# Patient Record
Sex: Female | Born: 1955
Health system: Southern US, Community
[De-identification: ages and names within clinical notes are randomized; demographics above are authoritative.]

## PROBLEM LIST (undated history)

## (undated) DIAGNOSIS — D649 Anemia, unspecified: Secondary | ICD-10-CM

## (undated) DIAGNOSIS — I10 Essential (primary) hypertension: Secondary | ICD-10-CM

## (undated) DIAGNOSIS — M3500 Sicca syndrome, unspecified: Secondary | ICD-10-CM

## (undated) DIAGNOSIS — Z972 Presence of dental prosthetic device (complete) (partial): Secondary | ICD-10-CM

## (undated) DIAGNOSIS — D72819 Decreased white blood cell count, unspecified: Secondary | ICD-10-CM

## (undated) DIAGNOSIS — F329 Major depressive disorder, single episode, unspecified: Secondary | ICD-10-CM

## (undated) DIAGNOSIS — S83249A Other tear of medial meniscus, current injury, unspecified knee, initial encounter: Secondary | ICD-10-CM

## (undated) DIAGNOSIS — I639 Cerebral infarction, unspecified: Secondary | ICD-10-CM

## (undated) DIAGNOSIS — F32A Depression, unspecified: Secondary | ICD-10-CM

## (undated) DIAGNOSIS — Z973 Presence of spectacles and contact lenses: Secondary | ICD-10-CM

## (undated) DIAGNOSIS — F419 Anxiety disorder, unspecified: Secondary | ICD-10-CM

## (undated) HISTORY — PX: LIVER BIOPSY: SHX301

## (undated) HISTORY — DX: Depression, unspecified: F32.A

## (undated) HISTORY — PX: LUNG BIOPSY: SHX232

## (undated) HISTORY — PX: COLONOSCOPY: SHX174

## (undated) HISTORY — DX: Essential (primary) hypertension: I10

## (undated) HISTORY — DX: Major depressive disorder, single episode, unspecified: F32.9

## (undated) HISTORY — DX: Cerebral infarction, unspecified: I63.9

---

## 1991-04-08 HISTORY — PX: PARTIAL HYSTERECTOMY: SHX80

## 1991-04-08 HISTORY — PX: APPENDECTOMY: SHX54

## 2008-11-17 ENCOUNTER — Encounter: Admission: RE | Admit: 2008-11-17 | Discharge: 2008-11-17 | Payer: Self-pay | Admitting: Family Medicine

## 2009-12-29 ENCOUNTER — Other Ambulatory Visit: Admission: RE | Admit: 2009-12-29 | Discharge: 2009-12-29 | Payer: Self-pay | Admitting: Family Medicine

## 2010-05-11 ENCOUNTER — Telehealth: Payer: Self-pay | Admitting: Gastroenterology

## 2010-05-15 ENCOUNTER — Encounter: Admission: RE | Admit: 2010-05-15 | Discharge: 2010-05-15 | Payer: Self-pay | Admitting: Gastroenterology

## 2010-11-07 NOTE — Progress Notes (Signed)
Summary: TRIAGE  Phone Note From Other Clinic   Caller: Kristen @ [facility] [phone] Call For: Dr. Arlyce Dice Summary of Call: Labs came back with Elevated liver test...requesting pt. be seen within a week Initial call taken by: Karna Christmas,  May 11, 2010 10:02 AM  Follow-up for Phone Call        Pt. will see Mike Gip PAC 05-12-10 at 2:30pm. Belenda Cruise will advise pt. of appt/med.list/co-pay/cx.policy and she will fax records to [facility] at [phone]. Follow-up by: Laureen Ochs LPN,  May 11, 2010 10:23 AM     Appended Document: TRIAGE Per Belenda Cruise, pt. states her PCP has already scheduled her to see a GI MD, she wants to keep that appt.

## 2011-04-10 ENCOUNTER — Encounter: Payer: Self-pay | Admitting: Pulmonary Disease

## 2011-04-12 ENCOUNTER — Ambulatory Visit (INDEPENDENT_AMBULATORY_CARE_PROVIDER_SITE_OTHER): Payer: BC Managed Care – PPO | Admitting: Pulmonary Disease

## 2011-04-12 ENCOUNTER — Encounter: Payer: Self-pay | Admitting: Pulmonary Disease

## 2011-04-12 VITALS — BP 104/60 | HR 50 | Temp 97.6°F | Ht 65.0 in | Wt 185.2 lb

## 2011-04-12 DIAGNOSIS — J8409 Other alveolar and parieto-alveolar conditions: Secondary | ICD-10-CM | POA: Insufficient documentation

## 2011-04-12 NOTE — Patient Instructions (Signed)
Will set up for breathing tests and a ct scan of your chest to compare to prior studies.  Will call you with results Will recommend to Dr. Katrinka Blazing that you be referred to a rheumatologist to see if there is a unifying diagnosis?

## 2011-04-12 NOTE — Progress Notes (Signed)
  Subjective:    Patient ID: Breanna Perry, female    DOB: [birth year], 55 y.o.   MRN: [id number]  HPI The pt is a 54y/o female who I have been asked to see for management of BOOP.  She was in her usual state of health with no pulmonary issues until Aug of 2011, when she developed a mild cough but no sob.  She was diagnosed at the same time with autoimmune hepatitis.  She ultimately developed pulmonary infiltrates by her history, and underwent VATS biopsy which was + for "boop".  Records are not available.  She was started on prednisone, and GI started on imuran for her hepatitis.  She has gradually been weaned off prednisone, and her last dose was 5 days ago.  It is unclear where she stands regarding her autoimmune workup.  Currently, the pt feels she is breathing well, with sob only with heavy exertional activities.  She thinks she is near her usual baseline from last summer.  Her last PFT's and cxr were 09/2010, and her last ct chest 05/2010.     Review of Systems  Constitutional: Positive for unexpected weight change. Negative for fever.  HENT: Negative for ear pain, nosebleeds, congestion, sore throat, rhinorrhea, sneezing, trouble swallowing, dental problem, postnasal drip and sinus pressure.   Eyes: Negative for redness and itching.  Respiratory: Negative for cough, chest tightness, shortness of breath and wheezing.   Cardiovascular: Negative for palpitations and leg swelling.  Gastrointestinal: Negative for nausea and vomiting.  Genitourinary: Negative for dysuria.  Musculoskeletal: Positive for joint swelling.  Skin: Negative for rash.  Neurological: Negative for headaches.  Hematological: Does not bruise/bleed easily.  Psychiatric/Behavioral: Positive for dysphoric mood. The patient is nervous/anxious.        Objective:   Physical Exam Constitutional:  Well developed, no acute distress  HENT:  Nares patent without discharge  Oropharynx without exudate, palate and uvula are  normal  Eyes:  Perrla, eomi, no scleral icterus  Neck:  No JVD, no TMG  Cardiovascular:  Normal rate, regular rhythm, no rubs or gallops.  No murmurs        Intact distal pulses  Pulmonary :  Normal breath sounds, no stridor or respiratory distress   No rales, rhonchi, or wheezing  Abdominal:  Soft, nondistended, bowel sounds present.  No tenderness noted.   Musculoskeletal:  No lower extremity edema noted.  Lymph Nodes:  No cervical lymphadenopathy noted  Skin:  No cyanosis noted  Neurologic:  Alert, appropriate, moves all 4 extremities without obvious deficit.         Assessment & Plan:

## 2011-04-12 NOTE — Assessment & Plan Note (Addendum)
The pt has a history of BOOP which was diagnosed last year by VATS biopsy, and has responded well from a symptom standpoint and by f/u cxr's with a course of prednisone and also imuran that is being used to treat her autoimmune hepatitis.  I have explained to the pt BOOP can be idiopathic, in response to insult such as chemical or smoke exposure, or can be from an autoimmune process such as RA, etc.  The question that I have is whether she has a systemic underlying autoimmune process that is driving all of this, or whether her episode of BOOP was an isolated incident.  Her imuran for the autoimmune hepatitis may be enough to keep her lung disease from recurring if this is a systemic process, but I think we should establish an xray and pfts baseline for future comparison now that she is off prednisone.  I would not do anything different from a pulmonary standpoint except follow her, but I do think she needs a rheumatology evaluation to see if this is all connected.

## 2011-04-16 ENCOUNTER — Ambulatory Visit (INDEPENDENT_AMBULATORY_CARE_PROVIDER_SITE_OTHER)
Admission: RE | Admit: 2011-04-16 | Discharge: 2011-04-16 | Disposition: A | Discharge status: Home / Self Care | Payer: BC Managed Care – PPO | Source: Ambulatory Visit | Attending: Pulmonary Disease | Admitting: Pulmonary Disease

## 2011-04-16 DIAGNOSIS — J8409 Other alveolar and parieto-alveolar conditions: Secondary | ICD-10-CM

## 2011-04-18 ENCOUNTER — Encounter: Payer: Self-pay | Admitting: Pulmonary Disease

## 2011-04-18 ENCOUNTER — Telehealth: Payer: Self-pay | Admitting: Pulmonary Disease

## 2011-04-18 NOTE — Telephone Encounter (Signed)
[id number]

## 2011-04-18 NOTE — Telephone Encounter (Signed)
PATIENT RETURNED CALL.  PLEASE CALL BACK AT [phone]

## 2011-04-18 NOTE — Telephone Encounter (Signed)
Spoke with pt and notified of CT Chest results per [facility]. She verbalized understanding and denied any questions.

## 2012-12-06 DIAGNOSIS — S83249A Other tear of medial meniscus, current injury, unspecified knee, initial encounter: Secondary | ICD-10-CM

## 2012-12-06 HISTORY — DX: Other tear of medial meniscus, current injury, unspecified knee, initial encounter: S83.249A

## 2012-12-29 ENCOUNTER — Other Ambulatory Visit: Payer: Self-pay | Admitting: Orthopedic Surgery

## 2013-01-01 ENCOUNTER — Encounter (HOSPITAL_BASED_OUTPATIENT_CLINIC_OR_DEPARTMENT_OTHER): Payer: Self-pay | Admitting: *Deleted

## 2013-01-01 NOTE — Pre-Procedure Instructions (Signed)
To come for BMET and EKG 

## 2013-01-06 ENCOUNTER — Encounter (HOSPITAL_BASED_OUTPATIENT_CLINIC_OR_DEPARTMENT_OTHER)
Admission: RE | Admit: 2013-01-06 | Discharge: 2013-01-06 | Disposition: A | Discharge status: Home / Self Care | Payer: BC Managed Care – PPO | Source: Ambulatory Visit | Attending: Orthopedic Surgery | Admitting: Orthopedic Surgery

## 2013-01-06 LAB — BASIC METABOLIC PANEL
Calcium: 10.3 mg/dL (ref 8.4–10.5)
Creatinine, Ser: 1.08 mg/dL (ref 0.50–1.10)
GFR calc Af Amer: 66 mL/min — ABNORMAL LOW (ref >90)
GFR calc non Af Amer: 57 mL/min — ABNORMAL LOW (ref >90)

## 2013-01-07 ENCOUNTER — Ambulatory Visit (HOSPITAL_BASED_OUTPATIENT_CLINIC_OR_DEPARTMENT_OTHER): Payer: BC Managed Care – PPO | Admitting: Certified Registered"

## 2013-01-07 ENCOUNTER — Encounter (HOSPITAL_BASED_OUTPATIENT_CLINIC_OR_DEPARTMENT_OTHER): Payer: Self-pay | Admitting: *Deleted

## 2013-01-07 ENCOUNTER — Encounter (HOSPITAL_BASED_OUTPATIENT_CLINIC_OR_DEPARTMENT_OTHER): Payer: Self-pay | Admitting: Certified Registered"

## 2013-01-07 ENCOUNTER — Encounter (HOSPITAL_BASED_OUTPATIENT_CLINIC_OR_DEPARTMENT_OTHER)
Admission: RE | Disposition: A | Discharge status: Home / Self Care | Payer: Self-pay | Source: Ambulatory Visit | Attending: Orthopedic Surgery

## 2013-01-07 ENCOUNTER — Ambulatory Visit (HOSPITAL_BASED_OUTPATIENT_CLINIC_OR_DEPARTMENT_OTHER)
Admission: RE | Admit: 2013-01-07 | Discharge: 2013-01-07 | Disposition: A | Discharge status: Home / Self Care | Payer: BC Managed Care – PPO | Source: Ambulatory Visit | Attending: Orthopedic Surgery | Admitting: Orthopedic Surgery

## 2013-01-07 DIAGNOSIS — IMO0002 Reserved for concepts with insufficient information to code with codable children: Secondary | ICD-10-CM | POA: Insufficient documentation

## 2013-01-07 DIAGNOSIS — Z791 Long term (current) use of non-steroidal anti-inflammatories (NSAID): Secondary | ICD-10-CM | POA: Insufficient documentation

## 2013-01-07 DIAGNOSIS — F329 Major depressive disorder, single episode, unspecified: Secondary | ICD-10-CM | POA: Insufficient documentation

## 2013-01-07 DIAGNOSIS — M6750 Plica syndrome, unspecified knee: Secondary | ICD-10-CM | POA: Insufficient documentation

## 2013-01-07 DIAGNOSIS — M3500 Sicca syndrome, unspecified: Secondary | ICD-10-CM | POA: Insufficient documentation

## 2013-01-07 DIAGNOSIS — F3289 Other specified depressive episodes: Secondary | ICD-10-CM | POA: Insufficient documentation

## 2013-01-07 DIAGNOSIS — F411 Generalized anxiety disorder: Secondary | ICD-10-CM | POA: Insufficient documentation

## 2013-01-07 DIAGNOSIS — I10 Essential (primary) hypertension: Secondary | ICD-10-CM | POA: Insufficient documentation

## 2013-01-07 DIAGNOSIS — Z79899 Other long term (current) drug therapy: Secondary | ICD-10-CM | POA: Insufficient documentation

## 2013-01-07 DIAGNOSIS — X58XXXA Exposure to other specified factors, initial encounter: Secondary | ICD-10-CM | POA: Insufficient documentation

## 2013-01-07 DIAGNOSIS — Z87891 Personal history of nicotine dependence: Secondary | ICD-10-CM | POA: Insufficient documentation

## 2013-01-07 DIAGNOSIS — M2240 Chondromalacia patellae, unspecified knee: Secondary | ICD-10-CM | POA: Insufficient documentation

## 2013-01-07 HISTORY — DX: Anxiety disorder, unspecified: F41.9

## 2013-01-07 HISTORY — PX: KNEE ARTHROSCOPY: SHX127

## 2013-01-07 HISTORY — DX: Sjogren syndrome, unspecified: M35.00

## 2013-01-07 HISTORY — DX: Presence of dental prosthetic device (complete) (partial): Z97.2

## 2013-01-07 HISTORY — DX: Other tear of medial meniscus, current injury, unspecified knee, initial encounter: S83.249A

## 2013-01-07 SURGERY — ARTHROSCOPY, KNEE
Anesthesia: General | Site: Knee | Laterality: Left | Wound class: Clean

## 2013-01-07 MED ORDER — MIDAZOLAM HCL 2 MG/2ML IJ SOLN: 1.0000 mg | INTRAMUSCULAR | Status: DC | PRN

## 2013-01-07 MED ORDER — MIDAZOLAM HCL 2 MG/ML PO SYRP: 12.0000 mg | ORAL_SOLUTION | Freq: Once | ORAL | Status: DC | PRN

## 2013-01-07 MED ORDER — LACTATED RINGERS IV SOLN
INTRAVENOUS | Status: DC
  Administered 2013-01-07 (×2): via INTRAVENOUS

## 2013-01-07 MED ORDER — FENTANYL CITRATE 0.05 MG/ML IJ SOLN
INTRAMUSCULAR | Status: DC | PRN
  Administered 2013-01-07: 25 ug via INTRAVENOUS
  Administered 2013-01-07: 100 ug via INTRAVENOUS

## 2013-01-07 MED ORDER — ONDANSETRON HCL 4 MG/2ML IJ SOLN
INTRAMUSCULAR | Status: DC | PRN
  Administered 2013-01-07: 4 mg via INTRAVENOUS

## 2013-01-07 MED ORDER — MIDAZOLAM HCL 5 MG/5ML IJ SOLN
INTRAMUSCULAR | Status: DC | PRN
  Administered 2013-01-07: 2 mg via INTRAVENOUS

## 2013-01-07 MED ORDER — BUPIVACAINE HCL (PF) 0.5 % IJ SOLN
INTRAMUSCULAR | Status: DC | PRN
  Administered 2013-01-07: 20 mL

## 2013-01-07 MED ORDER — CEFAZOLIN SODIUM-DEXTROSE 2-3 GM-% IV SOLR
2.0000 g | INTRAVENOUS | Status: AC
  Administered 2013-01-07: 2 g via INTRAVENOUS

## 2013-01-07 MED ORDER — FENTANYL CITRATE 0.05 MG/ML IJ SOLN: 50.0000 ug | INTRAMUSCULAR | Status: DC | PRN

## 2013-01-07 MED ORDER — HYDROMORPHONE HCL PF 1 MG/ML IJ SOLN: 0.2500 mg | INTRAMUSCULAR | Status: DC | PRN

## 2013-01-07 MED ORDER — LIDOCAINE HCL (CARDIAC) 20 MG/ML IV SOLN
INTRAVENOUS | Status: DC | PRN
  Administered 2013-01-07: 60 mg via INTRAVENOUS

## 2013-01-07 MED ORDER — KETOROLAC TROMETHAMINE 30 MG/ML IJ SOLN
INTRAMUSCULAR | Status: DC | PRN
  Administered 2013-01-07: 30 mg via INTRAVENOUS

## 2013-01-07 MED ORDER — DEXAMETHASONE SODIUM PHOSPHATE 4 MG/ML IJ SOLN
INTRAMUSCULAR | Status: DC | PRN
  Administered 2013-01-07: 8 mg via INTRAVENOUS

## 2013-01-07 MED ORDER — EPHEDRINE SULFATE 50 MG/ML IJ SOLN
INTRAMUSCULAR | Status: DC | PRN
  Administered 2013-01-07: 10 mg via INTRAVENOUS

## 2013-01-07 MED ORDER — OXYCODONE HCL 5 MG PO TABS: 5.0000 mg | ORAL_TABLET | Freq: Once | ORAL | Status: DC | PRN

## 2013-01-07 MED ORDER — OXYCODONE-ACETAMINOPHEN 5-325 MG PO TABS: 1.0000 | ORAL_TABLET | Freq: Four times a day (QID) | ORAL | Status: DC | PRN

## 2013-01-07 MED ORDER — OXYCODONE HCL 5 MG/5ML PO SOLN: 5.0000 mg | Freq: Once | ORAL | Status: DC | PRN

## 2013-01-07 MED ORDER — ACETAMINOPHEN 10 MG/ML IV SOLN
1000.0000 mg | Freq: Once | INTRAVENOUS | Status: AC
  Administered 2013-01-07: 1000 mg via INTRAVENOUS

## 2013-01-07 MED ORDER — POVIDONE-IODINE 7.5 % EX SOLN
Freq: Once | CUTANEOUS | Status: AC
  Administered 2013-01-07: 07:00:00 via TOPICAL

## 2013-01-07 MED ORDER — SODIUM CHLORIDE 0.9 % IR SOLN
Status: DC | PRN
  Administered 2013-01-07: 3000 mL

## 2013-01-07 MED ORDER — PROPOFOL 10 MG/ML IV BOLUS
INTRAVENOUS | Status: DC | PRN
  Administered 2013-01-07: 150 mg via INTRAVENOUS

## 2013-01-07 SURGICAL SUPPLY — 39 items
BANDAGE ELASTIC 6 VELCRO ST LF (GAUZE/BANDAGES/DRESSINGS) ×2 IMPLANT
BLADE 4.2CUDA (BLADE) IMPLANT
BLADE GREAT WHITE 4.2 (BLADE) ×2 IMPLANT
CANISTER OMNI JUG 16 LITER (MISCELLANEOUS) ×2 IMPLANT
CANISTER SUCTION 2500CC (MISCELLANEOUS) IMPLANT
CLOTH BEACON ORANGE TIMEOUT ST (SAFETY) ×2 IMPLANT
CUTTER MENISCUS  4.2MM (BLADE)
CUTTER MENISCUS 4.2MM (BLADE) IMPLANT
DRAPE ARTHROSCOPY W/POUCH 114 (DRAPES) ×2 IMPLANT
DRSG EMULSION OIL 3X3 NADH (GAUZE/BANDAGES/DRESSINGS) ×2 IMPLANT
DURAPREP 26ML APPLICATOR (WOUND CARE) ×2 IMPLANT
ELECT MENISCUS 165MM 90D (ELECTRODE) IMPLANT
ELECT REM PT RETURN 9FT ADLT (ELECTROSURGICAL)
ELECTRODE REM PT RTRN 9FT ADLT (ELECTROSURGICAL) IMPLANT
GLOVE BIO SURGEON STRL SZ 6.5 (GLOVE) ×2 IMPLANT
GLOVE BIOGEL PI IND STRL 8 (GLOVE) ×2 IMPLANT
GLOVE BIOGEL PI INDICATOR 8 (GLOVE) ×2
GLOVE ECLIPSE 7.5 STRL STRAW (GLOVE) ×4 IMPLANT
GLOVE INDICATOR 7.0 STRL GRN (GLOVE) ×2 IMPLANT
GOWN BRE IMP PREV XXLGXLNG (GOWN DISPOSABLE) ×2 IMPLANT
GOWN PREVENTION PLUS XLARGE (GOWN DISPOSABLE) ×2 IMPLANT
GOWN PREVENTION PLUS XXLARGE (GOWN DISPOSABLE) ×2 IMPLANT
HOLDER KNEE FOAM BLUE (MISCELLANEOUS) ×2 IMPLANT
KNEE WRAP E Z 3 GEL PACK (MISCELLANEOUS) ×2 IMPLANT
NDL SAFETY ECLIPSE 18X1.5 (NEEDLE) ×1 IMPLANT
NEEDLE HYPO 18GX1.5 SHARP (NEEDLE) ×1
PACK ARTHROSCOPY DSU (CUSTOM PROCEDURE TRAY) ×2 IMPLANT
PACK BASIN DAY SURGERY FS (CUSTOM PROCEDURE TRAY) ×2 IMPLANT
PAD CAST 4YDX4 CTTN HI CHSV (CAST SUPPLIES) ×1 IMPLANT
PADDING CAST COTTON 4X4 STRL (CAST SUPPLIES) ×1
PENCIL BUTTON HOLSTER BLD 10FT (ELECTRODE) IMPLANT
SET ARTHROSCOPY TUBING (MISCELLANEOUS) ×1
SET ARTHROSCOPY TUBING LN (MISCELLANEOUS) ×1 IMPLANT
SPONGE GAUZE 4X4 12PLY (GAUZE/BANDAGES/DRESSINGS) ×2 IMPLANT
SUT ETHILON 4 0 PS 2 18 (SUTURE) IMPLANT
SYR 5ML LL (SYRINGE) ×2 IMPLANT
TOWEL OR 17X24 6PK STRL BLUE (TOWEL DISPOSABLE) ×2 IMPLANT
TOWEL OR NON WOVEN STRL DISP B (DISPOSABLE) ×2 IMPLANT
WATER STERILE IRR 1000ML POUR (IV SOLUTION) ×2 IMPLANT

## 2013-01-07 NOTE — Brief Op Note (Signed)
01/07/2013  9:20 AM  PATIENT:  Breanna Perry  57 y.o. female  PRE-OPERATIVE DIAGNOSIS:  medial meniscus tear LEFT  POST-OPERATIVE DIAGNOSIS:  MEDIAL MENISCUS TEAR LEFT KNEE CHRONDROMALACIA MEDIAL PLICA  PROCEDURE:  Procedure(s): LEFT KNEE ARTHROSCOPY PARTIAL MEDIAL MENISECTOMY CHRONDOPLASTY MEDIAL PLICA RESECTION (Left)  SURGEON:  Surgeon(s) and Role:    * Harvie Junior, MD - Primary  PHYSICIAN ASSISTANT:   ASSISTANTS: bethune   ANESTHESIA:   general  EBL:  Total I/O In: 1000 [I.V.:1000] Out: -   BLOOD ADMINISTERED:none  DRAINS: none   LOCAL MEDICATIONS USED:  MARCAINE     SPECIMEN:  No Specimen  DISPOSITION OF SPECIMEN:  N/A  COUNTS:  YES  TOURNIQUET:  * No tourniquets in log *  DICTATION: .Other Dictation: Dictation Number [id number]  PLAN OF CARE: Discharge to home after PACU  PATIENT DISPOSITION:  PACU - hemodynamically stable.   Delay start of Pharmacological VTE agent (>24hrs) due to surgical blood loss or risk of bleeding: no

## 2013-01-07 NOTE — Transfer of Care (Signed)
Immediate Anesthesia Transfer of Care Note  Patient: Breanna Perry  Procedure(s) Performed: Procedure(s): LEFT KNEE ARTHROSCOPY PARTIAL MEDIAL MENISECTOMY CHRONDOPLASTY MEDIAL PLICA RESECTION (Left)  Patient Location: PACU  Anesthesia Type:General  Level of Consciousness: awake and alert   Airway & Oxygen Therapy: Patient Spontanous Breathing and Patient connected to face mask oxygen  Post-op Assessment: Report given to PACU RN and Post -op Vital signs reviewed and stable  Post vital signs: Reviewed and stable  Complications: No apparent anesthesia complications and Patient re-intubated

## 2013-01-07 NOTE — Anesthesia Procedure Notes (Signed)
Procedure Name: LMA Insertion Date/Time: 01/07/2013 8:48 AM Performed by: Verlan Friends Pre-anesthesia Checklist: Patient identified, Emergency Drugs available, Suction available, Patient being monitored and Timeout performed Patient Re-evaluated:Patient Re-evaluated prior to inductionOxygen Delivery Method: Circle System Utilized Preoxygenation: Pre-oxygenation with 100% oxygen Intubation Type: IV induction Ventilation: Mask ventilation without difficulty LMA: LMA inserted LMA Size: 4.0 Number of attempts: 1 Airway Equipment and Method: bite block Placement Confirmation: positive ETCO2 Tube secured with: Tape Dental Injury: Teeth and Oropharynx as per pre-operative assessment

## 2013-01-07 NOTE — Anesthesia Preprocedure Evaluation (Signed)
Anesthesia Evaluation  Patient identified by MRN, date of birth, ID band Patient awake    Reviewed: Allergy & Precautions, H&P , NPO status , Patient's Chart, lab work & pertinent test results, reviewed documented beta blocker date and time   Airway Mallampati: III TM Distance: >3 FB Neck ROM: Full    Dental no notable dental hx. (+) Teeth Intact and Dental Advisory Given   Pulmonary neg pulmonary ROS,  breath sounds clear to auscultation  Pulmonary exam normal       Cardiovascular hypertension, On Medications and On Home Beta Blockers Rhythm:Regular Rate:Normal     Neuro/Psych PSYCHIATRIC DISORDERS negative neurological ROS     GI/Hepatic negative GI ROS, Neg liver ROS,   Endo/Other  negative endocrine ROS  Renal/GU negative Renal ROS  negative genitourinary   Musculoskeletal   Abdominal   Peds  Hematology negative hematology ROS (+)   Anesthesia Other Findings   Reproductive/Obstetrics negative OB ROS                           Anesthesia Physical Anesthesia Plan  ASA: II  Anesthesia Plan: General   Post-op Pain Management:    Induction: Intravenous  Airway Management Planned: LMA  Additional Equipment:   Intra-op Plan:   Post-operative Plan: Extubation in OR  Informed Consent: I have reviewed the patients History and Physical, chart, labs and discussed the procedure including the risks, benefits and alternatives for the proposed anesthesia with the patient or authorized representative who has indicated his/her understanding and acceptance.   Dental advisory given  Plan Discussed with: CRNA  Anesthesia Plan Comments:         Anesthesia Quick Evaluation

## 2013-01-07 NOTE — H&P (Signed)
PREOPERATIVE H&P  Chief Complaint: l knee pain  HPI: Breanna Perry is a 57 y.o. female who presents for evaluation of l knee pain. It has been present for greater than 3 months and has been worsening. She has failed conservative measures. Pain is rated as moderate.  Past Medical History  Diagnosis Date  . Depression   . Sjogren's syndrome   . Anxiety   . Hypertension     under control with med., has been on med. x 8 yr.  . Dental bridge present     upper and lower  . Medial meniscus tear 12/2012    left   Past Surgical History  Procedure Laterality Date  . Appendectomy  04/1991  . Liver biopsy  summer 2011  . Lung biopsy  summer 2011  . Partial hysterectomy  04/1991    partial   History   Social History  . Marital Status: Single    Spouse Name: N/A    Number of Children: N  . Years of Education: N/A   Occupational History  . parts specialist [employer]     Social History Main Topics  . Smoking status: Former Smoker -- 1.00 packs/day for 31 years    Types: Cigarettes    Quit date: 10/08/2000  . Smokeless tobacco: Never Used  . Alcohol Use: Yes     Comment: rarely  . Drug Use: No  . Sexually Active: None   Other Topics Concern  . None   Social History Narrative  . None   Family History  Problem Relation Age of Onset  . Hypertension Father   . Emphysema Father   . Heart attack Father   . Cirrhosis Father   . Coronary artery disease Mother   . Heart failure Mother   . COPD Mother   . Breast cancer Maternal Grandmother   . HIV Brother   . Diabetes Sister    No Known Allergies Prior to Admission medications   Medication Sig Start Date End Date Taking? Authorizing Provider  clonazePAM (KLONOPIN) 0.5 MG tablet Take 0.5 mg by mouth 2 (two) times daily.    Yes Historical Provider, MD  colesevelam (WELCHOL) 625 MG tablet Take 1,875 mg by mouth 2 (two) times daily with a meal.   Yes Historical Provider, MD  diclofenac (VOLTAREN) 25 MG EC tablet Take  25 mg by mouth 2 (two) times daily.   Yes Historical Provider, MD  DULoxetine (CYMBALTA) 60 MG capsule Take 60 mg by mouth daily.    Yes Historical Provider, MD  fish oil-omega-3 fatty acids 1000 MG capsule Take 2 g by mouth daily.   Yes Historical Provider, MD  Glucosamine-Chondroit-Vit C-Mn (GLUCOSAMINE CHONDR 1500 COMPLX PO) Take 2 capsules by mouth daily.     Yes Historical Provider, MD  hydroxychloroquine (PLAQUENIL) 200 MG tablet Take 400 mg by mouth daily.   Yes Historical Provider, MD  metoprolol (TOPROL-XL) 50 MG 24 hr tablet Take 50 mg by mouth 2 (two) times daily.    Yes Historical Provider, MD  Olmesartan-Amlodipine-HCTZ (TRIBENZOR) 40-10-25 MG TABS Take by mouth daily.    Yes Historical Provider, MD  doxepin (SINEQUAN) 25 MG capsule Take 25 mg by mouth.    Historical Provider, MD  traMADol (ULTRAM) 50 MG tablet Take 50 mg by mouth 2 (two) times daily.     Historical Provider, MD     Positive ROS: none  All other systems have been reviewed and were otherwise negative with the exception of those mentioned in  the HPI and as above.  Physical Exam: Filed Vitals:   01/07/13 0713  BP: 101/69  Pulse: 78  Temp: 97.9 F (36.6 C)  Resp: 18    General: Alert, no acute distress Cardiovascular: No pedal edema Respiratory: No cyanosis, no use of accessory musculature GI: No organomegaly, abdomen is soft and non-tender Skin: No lesions in the area of chief complaint Neurologic: Sensation intact distally Psychiatric: Patient is competent for consent with normal mood and affect Lymphatic: No axillary or cervical lymphadenopathy  MUSCULOSKELETAL: l knee +Mcm[place]ray// -instability//painful ROM  Assessment/Plan: medial meniscus tear LEFT Plan for Procedure(s): LEFT KNEE ARTHROSCOPY  The risks benefits and alternatives were discussed with the patient including but not limited to the risks of nonoperative treatment, versus surgical intervention including infection, bleeding, nerve  injury, malunion, nonunion, hardware prominence, hardware failure, need for hardware removal, blood clots, cardiopulmonary complications, morbidity, mortality, among others, and they were willing to proceed.  Predicted outcome is good, although there will be at least a six to nine month expected recovery.  Ladanian Kelter L, MD 01/07/2013 8:39 AM

## 2013-01-07 NOTE — Anesthesia Postprocedure Evaluation (Signed)
  Anesthesia Post-op Note  Patient: Breanna Perry, Breanna Perry  Procedure(s) Performed: Procedure(s): LEFT KNEE ARTHROSCOPY PARTIAL MEDIAL MENISECTOMY CHRONDOPLASTY MEDIAL PLICA RESECTION (Left)  Patient Location: PACU  Anesthesia Type:General  Level of Consciousness: awake and alert   Airway and Oxygen Therapy: Patient Spontanous Breathing  Post-op Pain: none  Post-op Assessment: Post-op Vital signs reviewed, Patient's Cardiovascular Status Stable, Respiratory Function Stable, Patent Airway and No signs of Nausea or vomiting  Post-op Vital Signs: Reviewed and stable  Complications: No apparent anesthesia complications

## 2013-01-08 ENCOUNTER — Encounter (HOSPITAL_BASED_OUTPATIENT_CLINIC_OR_DEPARTMENT_OTHER): Payer: Self-pay | Admitting: Orthopedic Surgery

## 2013-01-09 NOTE — Op Note (Signed)
NAMESTARLETTA, HOUCHIN            ACCOUNT NO.:  [id number]  MEDICAL RECORD NO.:  [id number]  LOCATION:                               FACILITY:  [facility]  PHYSICIAN:  Harvie Junior, M.D.   DATE OF BIRTH:  [birth year]  DATE OF PROCEDURE:  01/07/2013 DATE OF DISCHARGE:  01/07/2013                              OPERATIVE REPORT   PREOPERATIVE DIAGNOSIS:  Medial meniscal tear.  POSTOPERATIVE DIAGNOSES: 1. Medial meniscal tear. 2. Chondromalacia, patellofemoral joint. 3. Medial shelf plica.  PRINCIPLE PROCEDURES: 1. Partial posterior __________ corresponding debridement of medial     compartment. 2. Chondroplasty patellofemoral joint. 3. Debridement medial shelf plica.  SURGEON:  Harvie Junior, M.D.  ASSISTANT:  Marshia Ly, P.A.  ANESTHESIA:  General.  BRIEF HISTORY:  Ms. Andrew is a 57 year old female with history of having significant medial thigh complaint.  She has had an old MRI years ago, which showed some questionable issues with a medial meniscus.  We saw her back in the office.  She was having classic mechanical symptoms. She felt she had medial meniscal tear.  We injected her.  She got better only briefly, and at that point, felt like she needed an arthroscopy. She brought to the operating room for this procedure.  PROCEDURE:  The patient was brought to the operating room and after adequate anesthesia was obtained with general anesthetic, the patient was placed supine on the operating table.  The left leg was then prepped and draped in the sterile fashion __________ before.  Once this was done, the left leg was prepped and draped in the usual sterile fashion. Following the routine arthroscopic examination, knee revealed there was obvious chondromalacia, patellofemoral joint was debrided back to a smooth and stable rim.  Large draping medial shelf plica was identified and debrided, which allowed access into the medial compartment.  Once this was done, there is a  midbody medial meniscal tear, which extended around posteriorly.  We debrided this with a combination of straight biting forceps, upbiting forceps, remaining meniscal rim was contoured down with the suction shaver.  Medial compartment had some grade 2 change, which was debrided on both the femoral and tibial side.  ACL was normal.  Lateral side normal.  Patellofemoral joint was again evaluated. The knee was then copiously and thoroughly irrigated and suctioned.  __________ were closed with bandage.  A sterile compressive dressing was applied.  The patient was taken to the recovery room where she was noted to be in satisfactory condition.  Estimated blood loss for the procedure was none.     Harvie Junior, M.D.     Ranae Plumber  D:  01/07/2013  T:  01/07/2013  Job:  [id number]

## 2013-08-13 ENCOUNTER — Other Ambulatory Visit: Payer: Self-pay

## 2014-03-29 ENCOUNTER — Encounter: Payer: Self-pay | Admitting: Neurology

## 2014-03-29 ENCOUNTER — Ambulatory Visit (INDEPENDENT_AMBULATORY_CARE_PROVIDER_SITE_OTHER): Payer: BC Managed Care – PPO | Admitting: Neurology

## 2014-03-29 VITALS — BP 102/78 | HR 72 | Ht 63.39 in | Wt 173.5 lb

## 2014-03-29 DIAGNOSIS — H9319 Tinnitus, unspecified ear: Secondary | ICD-10-CM

## 2014-03-29 NOTE — Progress Notes (Signed)
NEUROLOGY CONSULTATION NOTE  Breanna Perry MRN: [id number] DOB: 10/29/1955  Referring provider: Dr. Tamala Julian Primary care provider: Dr. Tamala Julian  Reason for consult:  Hears a sound in her head  HISTORY OF PRESENT ILLNESS: Breanna Perry is a 58 year old right-handed woman with history of tinnitus, hypertension and hyperlipidemia who presents for ringing in her head.  There are no notes or images to review.  She does have a history of tinnitus going back 5-7 years, but this is different. She began noticing this back in January 2015. At first she thought it was a noise in the house. However, when she looked around to find the source, she noted that this sounded and disappear. It is located in the left posterior part of her head and not in her ears. The sound fluctuates in changes depending on environment. When she is working in [employer], it sounds like spray pain dispensing from a 10. When she is sitting in a quiet room, it sounds like the tripping of insects. She feels that the pitch gets louder asked her she eats a large meal. She is not sure if it wakes her up from sleep, but she always years it first thing she wakes up. It is constant, 24/7. Sometimes it is so loud and irritable, she has to listen to music with earphones or go to an environment where the surrounding noises louder. Occasionally, she will note a mild bifrontal, non-throbbing tension-type headache. Occasionally, she would note some blurred vision. She will occasionally feel dizzy. Particularly, she notes feeling lightheaded if she stands up. When this occurs, she will feel sometimes off balance. The dizziness is not described as a spinning sensation. On at least one occasion, she noted palpitations. However, she has not noted any diaphoresis. There is no associated nausea or focal numbness or weakness. She notes some arthralgias. She has not been started on any new medications and has not been exposed to any toxins. She doesn't  feel like she has lost hearing but other people have question that. She did see ENT who told her that she has some hearing loss, but it wouldn't be contributing to this noise.  Medications include:  Clonazepam, diclofenac, duloxetine, metoprolol, Tribenzor, Zetia PAST MEDICAL HISTORY: Past Medical History  Diagnosis Date  . Depression   . Sjogren's syndrome   . Anxiety   . Hypertension     under control with med., has been on med. x 8 yr.  . Dental bridge present     upper and lower  . Medial meniscus tear 12/2012    left    PAST SURGICAL HISTORY: Past Surgical History  Procedure Laterality Date  . Appendectomy  04/1991  . Liver biopsy  summer 2011  . Lung biopsy  summer 2011  . Partial hysterectomy  04/1991    partial  . Knee arthroscopy Left 01/07/2013    Procedure: LEFT KNEE ARTHROSCOPY PARTIAL MEDIAL MENISECTOMY CHRONDOPLASTY MEDIAL PLICA RESECTION;  Surgeon: Alta Corning, MD;  Location: [facility];  Service: Orthopedics;  Laterality: Left;    MEDICATIONS: Current Outpatient Prescriptions on File Prior to Visit  Medication Sig Dispense Refill  . clonazePAM (KLONOPIN) 0.5 MG tablet Take 0.5 mg by mouth 2 (two) times daily.       . diclofenac (VOLTAREN) 25 MG EC tablet Take 25 mg by mouth 2 (two) times daily.      Marland Kitchen doxepin (SINEQUAN) 25 MG capsule Take 25 mg by mouth.      . DULoxetine (  CYMBALTA) 60 MG capsule Take 60 mg by mouth daily.       . metoprolol (TOPROL-XL) 50 MG 24 hr tablet Take 50 mg by mouth 2 (two) times daily.       . Olmesartan-Amlodipine-HCTZ (TRIBENZOR) 40-10-25 MG TABS Take by mouth daily.        No current facility-administered medications on file prior to visit.    ALLERGIES: No Known Allergies  FAMILY HISTORY: Family History  Problem Relation Age of Onset  . Hypertension Father   . Emphysema Father   . Heart attack Father   . Cirrhosis Father   . Coronary artery disease Mother   . Heart failure Mother   . COPD Mother   .  Breast cancer Maternal Grandmother   . HIV Brother   . Diabetes Sister     SOCIAL HISTORY: History   Social History  . Marital Status: Single    Spouse Name: N/A    Number of Children: N  . Years of Education: N/A   Occupational History  . parts specialist [employer]     Social History Main Topics  . Smoking status: Former Smoker -- 1.00 packs/day for 31 years    Types: Cigarettes    Quit date: 10/08/2000  . Smokeless tobacco: Never Used  . Alcohol Use: Yes     Comment: rarely  . Drug Use: No  . Sexual Activity: Not on file   Other Topics Concern  . Not on file   Social History Narrative  . No narrative on file    REVIEW OF SYSTEMS: Constitutional: Feels lightheaded sometimes.  No fevers, chills, or sweats, no generalized fatigue, change in appetite Eyes: No visual changes, double vision, eye pain Ear, nose and throat: No hearing loss, ear pain, nasal congestion, sore throat Cardiovascular: No chest pain, palpitations Respiratory:  No shortness of breath at rest or with exertion, wheezes GastrointestinaI: No nausea, vomiting, diarrhea, abdominal pain, fecal incontinence Genitourinary:  No dysuria, urinary retention or frequency Musculoskeletal:  arthralgias Integumentary: No rash, pruritus, skin lesions Neurological: as above Psychiatric: No depression, insomnia, anxiety Endocrine: No palpitations, fatigue, diaphoresis, mood swings, change in appetite, change in weight, increased thirst Hematologic/Lymphatic:  No anemia, purpura, petechiae. Allergic/Immunologic: no itchy/runny eyes, nasal congestion, recent allergic reactions, rashes  PHYSICAL EXAM: Filed Vitals:   03/29/14 1244  BP: 120/84  Pulse: 72  Orthostatics normal. General: No acute distress Head:  Normocephalic/atraumatic Neck: supple, no paraspinal tenderness, full range of motion Back: No paraspinal tenderness Heart: regular rate and rhythm Lungs: Clear to auscultation  bilaterally. Vascular: No carotid bruits. Neurological Exam: Mental status: alert and oriented to person, place, and time, recent and remote memory intact, fund of knowledge intact, attention and concentration intact, speech fluent and not dysarthric, language intact. Cranial nerves: CN I: not tested CN II: pupils equal, round and reactive to light, visual fields intact, fundi unremarkable, without vessel changes, exudates, hemorrhages or papilledema. CN III, IV, VI:  full range of motion, no nystagmus, no ptosis CN V: facial sensation intact CN VII: upper and lower face symmetric CN VIII: hearing intact CN IX, X: gag intact, uvula midline CN XI: sternocleidomastoid and trapezius muscles intact CN XII: tongue midline Bulk & Tone: normal, no fasciculations. Motor: 5 out of 5 throughout Sensation: Temperature and vibration intact Deep Tendon Reflexes: 2+ throughout, toes downgoing Finger to nose testing: No dysmetria Heel to shin: No dysmetria Gait: Normal station and stride. Able to turn and walk in tandem. Romberg negative.  IMPRESSION:  Tinnitus.  It is different than her classic tinnitus.  It is not described as pulsatile tinnitus.  Exam is normal, which is reassuring.  PLAN: 1.  Check MRI of the brain with and without contrast.  Since she does not describe pulsatile tinnitus, I don't think we need to perform MRA. 2.  Check B12 and TSH 3.  If MRI unremarkable, then would recommend focusing treatment on supportive care, such as use of a white noise machine, biofeedback or cognitive behavioral therapy.  Thank you for allowing me to take part in the care of this patient.  Metta Clines, DO  CC:  Candace Wyline Copas, MD

## 2014-03-29 NOTE — Patient Instructions (Addendum)
You already had your ears checked.  You are not on any medications that would cause this.  You have not been exposed to any chemicals or toxins.  At this point, I would check MRI of the brain to look for any possible cause.  Your exam is normal, which is reassuring.  Ultimately, if the MRI doesn't reveal anything, then I don't suspect anything dangerous and would just consider supportive therapies such as use of a white noise machine, biofeedback or cognitive behavior therapy.  I will contact you with MRI and whether anything else needs to be done.  We will also check tyroid and B12 levels.  MRI [facility]  04/19/14 @3 :45pm

## 2014-03-30 ENCOUNTER — Telehealth: Payer: Self-pay | Admitting: Neurology

## 2014-03-30 ENCOUNTER — Telehealth: Payer: Self-pay | Admitting: *Deleted

## 2014-03-30 LAB — CREATININE, SERUM: Creat: 1.01 mg/dL (ref 0.50–1.10)

## 2014-03-30 LAB — TSH: TSH: 3.391 u[IU]/mL (ref 0.350–4.500)

## 2014-03-30 LAB — BUN: BUN: 12 mg/dL (ref 6–23)

## 2014-03-30 LAB — VITAMIN B12: Vitamin B-12: 621 pg/mL (ref 211–911)

## 2014-03-30 NOTE — Telephone Encounter (Signed)
Patient is aware of normal labs  

## 2014-03-30 NOTE — Telephone Encounter (Signed)
Message copied by Claudie Revering on Tue Mar 30, 2014  8:10 AM ------      Message from: JAFFE, ADAM R      Created: Tue Mar 30, 2014  6:36 AM       Labs look okay      ----- Message -----         From: Lab in [facility] Interface         Sent: 03/30/2014   1:57 AM           To: Dudley Major, DO                   ------

## 2014-03-30 NOTE — Telephone Encounter (Signed)
Pt called requesting to speak to a nurse regarding her visit yesterday.  Her condition seems to be getting worse. Please call Pt [phone] She also wants to know if her MRI can be moved up sooner.

## 2014-03-30 NOTE — Telephone Encounter (Signed)
Patient states she is have shooting pain 5 times today

## 2014-03-31 ENCOUNTER — Telehealth: Payer: Self-pay | Admitting: Neurology

## 2014-03-31 NOTE — Telephone Encounter (Signed)
I explained to patient that the Saturday that that was offered to her earlier has already been taken she will keep her appt that was scheduled

## 2014-03-31 NOTE — Telephone Encounter (Signed)
Pt called regarding her MRI she is scheduled to have 04/22/14. Pt states to see if her appt can be moved up Even it is on a Saturday. She expressed that someone had told her that she could have her MRI on a Saturday.  Please call pt to confirm c/b [phone]

## 2014-04-19 ENCOUNTER — Ambulatory Visit (HOSPITAL_COMMUNITY)
Admission: RE | Admit: 2014-04-19 | Discharge: 2014-04-19 | Disposition: A | Discharge status: Home / Self Care | Payer: BC Managed Care – PPO | Source: Ambulatory Visit | Attending: Neurology | Admitting: Neurology

## 2014-04-19 DIAGNOSIS — H9319 Tinnitus, unspecified ear: Secondary | ICD-10-CM | POA: Insufficient documentation

## 2014-04-19 MED ORDER — GADOBENATE DIMEGLUMINE 529 MG/ML IV SOLN
15.0000 mL | Freq: Once | INTRAVENOUS | Status: AC | PRN
  Administered 2014-04-19: 15 mL via INTRAVENOUS

## 2014-04-20 ENCOUNTER — Telehealth: Payer: Self-pay | Admitting: *Deleted

## 2014-04-20 NOTE — Telephone Encounter (Signed)
Patient is aware of MRI results

## 2014-04-20 NOTE — Telephone Encounter (Signed)
Patient is aware of MRI of the brain results

## 2014-04-20 NOTE — Telephone Encounter (Signed)
Message copied by Claudie Revering on Tue Apr 20, 2014  8:07 AM ------      Message from: JAFFE, ADAM R      Created: Tue Apr 20, 2014  7:01 AM       MRI of brain looks unremarkable. As discussed, I would suggest getting a white noise machine to use at night.      ----- Message -----         From: Rad Results In Interface         Sent: 04/19/2014   6:23 PM           To: Dudley Major, DO                   ------

## 2014-04-20 NOTE — Telephone Encounter (Signed)
Message copied by Claudie Revering on Tue Apr 20, 2014  8:09 AM ------      Message from: JAFFE, ADAM R      Created: Tue Apr 20, 2014  7:01 AM       MRI of brain looks unremarkable. As discussed, I would suggest getting a white noise machine to use at night.      ----- Message -----         From: Rad Results In Interface         Sent: 04/19/2014   6:23 PM           To: Dudley Major, DO                   ------

## 2015-06-22 HISTORY — PX: KNEE SURGERY: SHX244

## 2015-07-20 ENCOUNTER — Encounter: Payer: Self-pay | Admitting: Physical Therapy

## 2015-07-20 ENCOUNTER — Ambulatory Visit: Payer: BLUE CROSS/BLUE SHIELD | Attending: Orthopaedic Surgery | Admitting: Physical Therapy

## 2015-07-20 DIAGNOSIS — R262 Difficulty in walking, not elsewhere classified: Secondary | ICD-10-CM | POA: Insufficient documentation

## 2015-07-20 DIAGNOSIS — M25562 Pain in left knee: Secondary | ICD-10-CM | POA: Diagnosis not present

## 2015-07-20 DIAGNOSIS — M25462 Effusion, left knee: Secondary | ICD-10-CM | POA: Diagnosis present

## 2015-07-20 DIAGNOSIS — M25662 Stiffness of left knee, not elsewhere classified: Secondary | ICD-10-CM | POA: Insufficient documentation

## 2015-07-20 NOTE — Therapy (Signed)
[place] [place], [place], [address] Phone: [phone]   Fax:  [phone]  Physical Therapy Evaluation  Patient Details  Name: Breanna Perry MRN: [id number] Date of Birth: July 06, 1956 Referring Provider:  Berenice Primas, MD  Encounter Date: 07/20/2015      PT End of Session - 07/20/15 1417    Visit Number 1   Date for PT Re-Evaluation 09/19/15   PT Start Time 1339   PT Stop Time 1433   PT Time Calculation (min) 54 min   Activity Tolerance Patient tolerated treatment well   Behavior During Therapy [facility] for tasks assessed/performed      Past Medical History  Diagnosis Date  . Depression   . Sjogren's syndrome ([place])   . Anxiety   . Hypertension     under control with med., has been on med. x 8 yr.  . Dental bridge present     upper and lower  . Medial meniscus tear 12/2012    left    Past Surgical History  Procedure Laterality Date  . Appendectomy  04/1991  . Liver biopsy  summer 2011  . Lung biopsy  summer 2011  . Partial hysterectomy  04/1991    partial  . Knee arthroscopy Left 01/07/2013    Procedure: LEFT KNEE ARTHROSCOPY PARTIAL MEDIAL MENISECTOMY CHRONDOPLASTY MEDIAL PLICA RESECTION;  Surgeon: Alta Corning, MD;  Location: [facility];  Service: Orthopedics;  Laterality: Left;    There were no vitals filed for this visit.  Visit Diagnosis:  Left knee pain - Plan: PT plan of care cert/re-cert  Swelling of left knee joint - Plan: PT plan of care cert/re-cert  Difficulty walking - Plan: PT plan of care cert/re-cert  Stiffness of left knee - Plan: PT plan of care cert/re-cert      Subjective Assessment - 07/20/15 1340    Subjective Patient had a left partial knee replacement on June 22, 2015.  Had a scope in 2014.  Finished PT at home about two weeks ago   Limitations Standing;Walking   Patient Stated Goals walk and have less pain   Currently in Pain? Yes    Pain Score 5    Pain Location Knee   Pain Orientation Left   Pain Descriptors / Indicators Aching   Pain Type Surgical pain   Pain Onset More than a month ago   Pain Frequency Constant   Aggravating Factors  long periods of sitting increases pain as well as long standing   Pain Relieving Factors rest, change of positions   Effect of Pain on Daily Activities hard to get around            [facility] PT Assessment - 07/20/15 0001    Assessment   Medical Diagnosis left partial knee replacement   Onset Date/Surgical Date 06/22/15   Prior Therapy home   Precautions   Precautions None   Balance Screen   Has the patient fallen in the past 6 months No   Has the patient had a decrease in activity level because of a fear of falling?  No   Is the patient reluctant to leave their home because of a fear of falling?  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, lifts 50# lifting, some ladders   Leisure walks the dog, was doing some exercise routine   Observation/Other Assessments-Edema    Edema --  swelling int he left knee   AROM   Overall AROM Comments AROM of the left knee at edge of bed 5-106 degrees flexion, PROM 3-115 degrees flexion   Strength   Overall Strength Comments 4-/5 with slight pain   Palpation   Palpation comment warmth in the knee, swelling behind patella, mobile scar   Ambulation/Gait   Gait Comments uses a SPC, significant antalgic on the left, stairs one step at a time                   [facility] Adult PT Treatment/Exercise - 07/20/15 0001    Exercises   Exercises Knee/Hip   Knee/Hip Exercises: Aerobic   Stationary Bike 5 minutes   Nustep Level 5 x 5 minutes   Knee/Hip Exercises: Machines for Strengthening   Cybex  Knee Flexion 25# 2x15   Cybex Leg Press 20# 2x10, then single leg x 10   Modalities   Modalities Vasopneumatic   Vasopneumatic   Number Minutes Vasopneumatic  15 minutes   Vasopnuematic Location  Knee   Vasopneumatic Pressure High   Vasopneumatic Temperature  35                  PT Short Term Goals - 07/20/15 1420    PT SHORT TERM GOAL #1   Title independent with initial HEP   Time 1   Period Weeks   Status New           PT Long Term Goals - 07/20/15 1420    PT LONG TERM GOAL #1   Title go up and down stairs step over step   Time 8   Period Weeks   Status New   PT LONG TERM GOAL #2   Title walk all community distances without assistive device and minimal deviation   Time 8   Period Weeks   Status New   PT LONG TERM GOAL #3   Title increase AROM of the left knee to 0-120 degrees flexion   Time 8   Period Weeks   Status New   PT LONG TERM GOAL #4   Title independent with RICE   Time 8   Period Weeks   Status New   PT LONG TERM GOAL #5   Title increase strength to 4+/5   Time 8   Period Weeks   Status New               Plan - 07/20/15 1418    Clinical Impression Statement Patient reports that she has had some left knee pain for about 3 years, she underwent a left partial knee replacement on 06/22/15.  Has some decreased ROM and strength, poor gait pattern.   Pt will benefit from skilled therapeutic intervention in order to improve on the following deficits Abnormal gait;Decreased mobility;Decreased range of motion;Decreased strength;Difficulty walking;Impaired flexibility;Pain   Rehab Potential Good   PT Frequency 2x / week   PT Duration 8 weeks   PT Treatment/Interventions Electrical Stimulation;Cryotherapy;Therapeutic exercise;Vasopneumatic Device;Manual techniques;Functional mobility training;Stair training;Gait training;Balance training;Passive range of motion   PT Next Visit Plan dd exercises as tolerated, see if we can help her gait     Consulted and Agree with Plan of Care Patient         Problem List  Patient Active Problem List   Diagnosis Date Noted  . Alveolar pneumopathy ([place]) 04/12/2011    Sumner Boast., PT 07/20/2015, 2:25 PM  [place] [address] [place], [place], [address] Phone: [phone]   Fax:  [phone]

## 2015-07-20 NOTE — Patient Instructions (Signed)
Quad Set   Slowly tighten thigh muscles of straight leg hold 2 seconds. Relax. Repeat _15___ times. Do __3__ sessions per day.  Short Arc Quad   Place a large can or rolled towel under leg. Straighten knee and leg. Hold _3___ seconds. Repeat with other leg. Repeat _15_ times. Do _3___ sessions per day.  PROM: Gastroc Stretch   Stand with right foot back, leg straight, forward leg bent. Keeping heel on floor, turned slightly out, lean into wall until stretch is felt in calf. Hold _30___ seconds. Repeat _3___ times per set. Do __2__ sets per session. Do __2__ sessions per day.   Knee Flexion   With towel around left heel, gently pull knee up with towel until stretch is felt. Hold _10__ seconds.  Repeat _10___ times per set. Do __2__ sets per session. Do __3__ sessions per day.  

## 2015-07-26 ENCOUNTER — Encounter: Payer: Self-pay | Admitting: Physical Therapy

## 2015-07-26 ENCOUNTER — Ambulatory Visit: Payer: BLUE CROSS/BLUE SHIELD | Admitting: Physical Therapy

## 2015-07-26 DIAGNOSIS — M25562 Pain in left knee: Secondary | ICD-10-CM | POA: Diagnosis not present

## 2015-07-26 DIAGNOSIS — M25462 Effusion, left knee: Secondary | ICD-10-CM

## 2015-07-26 DIAGNOSIS — M25662 Stiffness of left knee, not elsewhere classified: Secondary | ICD-10-CM

## 2015-07-26 NOTE — Therapy (Signed)
[place], [place], [address] Phone: [phone]   Fax:  [phone]  Physical Therapy Treatment  Patient Details  Name: Cyann Venti MRN: [id number] Date of Birth: Oct 25, 1955 No Data Recorded  Encounter Date: 07/26/2015      PT End of Session - 07/26/15 1643    Visit Number 2   Date for PT Re-Evaluation 09/19/15   PT Start Time 1600   PT Stop Time 1659   PT Time Calculation (min) 59 min   Activity Tolerance Patient tolerated treatment well   Behavior During Therapy [facility] for tasks assessed/performed      Past Medical History  Diagnosis Date  . Depression   . Sjogren's syndrome ([place])   . Anxiety   . Hypertension     under control with med., has been on med. x 8 yr.  . Dental bridge present     upper and lower  . Medial meniscus tear 12/2012    left    Past Surgical History  Procedure Laterality Date  . Appendectomy  04/1991  . Liver biopsy  summer 2011  . Lung biopsy  summer 2011  . Partial hysterectomy  04/1991    partial  . Knee arthroscopy Left 01/07/2013    Procedure: LEFT KNEE ARTHROSCOPY PARTIAL MEDIAL MENISECTOMY CHRONDOPLASTY MEDIAL PLICA RESECTION;  Surgeon: Alta Corning, MD;  Location: [facility];  Service: Orthopedics;  Laterality: Left;    There were no vitals filed for this visit.  Visit Diagnosis:  Swelling of left knee joint  Left knee pain  Stiffness of left knee      Subjective Assessment - 07/26/15 1604    Subjective Pt reports that she has been feeling ok.    Currently in Pain? Yes   Pain Score 5    Pain Location Knee   Pain Orientation Left   Pain Descriptors / Indicators Aching   Pain Type Surgical pain            [facility] PT Assessment - 07/26/15 0001    AROM   Overall AROM Comments AROM of the left knee at edge of bed 3-116 degrees flexion, PROM 0-125 degrees flexion                     [facility] Adult PT  Treatment/Exercise - 07/26/15 0001    Knee/Hip Exercises: Aerobic   Stationary Bike L2 x4 min    Nustep Level 5 x 5 minutes   Knee/Hip Exercises: Machines for Strengthening   Cybex Knee Flexion 25# 2x15   Cybex Leg Press 60# 2x10, then single leg x 10 #20    Knee/Hip Exercises: Standing   Forward Step Up Left;2 sets;10 reps  explosive   Other Standing Knee Exercises wall squats 15 sec x3    Other Standing Knee Exercises Side step ups 6in 2x10    Knee/Hip Exercises: Seated   Long Arc Quad Left;20 reps   Other Seated Knee/Hip Exercises x20   Marching Weights 3 lbs.   Sit to Sand 2 sets;10 reps;without UE support  #10    Modalities   Modalities Vasopneumatic   Vasopneumatic   Number Minutes Vasopneumatic  15 minutes   Vasopnuematic Location  Knee   Vasopneumatic Pressure Medium   Vasopneumatic Temperature  35                  PT Short Term Goals - 07/20/15 1420    PT SHORT  TERM GOAL #1   Title independent with initial HEP   Time 1   Period Weeks   Status New           PT Long Term Goals - 07/26/15 1646    PT LONG TERM GOAL #3   Title increase AROM of the left knee to 0-120 degrees flexion   Status Partially Met   PT LONG TERM GOAL #4   Title independent with RICE               Plan - 07/26/15 1644    Clinical Impression Statement Pt tolerated gym level exercises well this date without subjective reports of increase pain. Pt did report pain with PROM at end ranges.Overall AROM has improved.   Pt will benefit from skilled therapeutic intervention in order to improve on the following deficits Abnormal gait;Decreased mobility;Decreased range of motion;Decreased strength;Difficulty walking;Impaired flexibility;Pain   Rehab Potential Good   PT Frequency 2x / week   PT Duration 8 weeks   PT Treatment/Interventions Electrical Stimulation;Cryotherapy;Therapeutic exercise;Vasopneumatic Device;Manual techniques;Functional mobility training;Stair training;Gait  training;Balance training;Passive range of motion   PT Next Visit Plan dd exercises as tolerated, see if we can help her gait         Problem List Patient Active Problem List   Diagnosis Date Noted  . Alveolar pneumopathy ([place]) 04/12/2011    Scot Jun, PTA  07/26/2015, 4:47 PM  [place] [address] [place], [place], [address] Phone: [phone]   Fax:  [phone]  Name: Clancy Leiner MRN: [id number] Date of Birth: [birth year]

## 2015-07-28 ENCOUNTER — Ambulatory Visit: Payer: BLUE CROSS/BLUE SHIELD | Admitting: Physical Therapy

## 2015-08-02 ENCOUNTER — Encounter: Payer: Self-pay | Admitting: Physical Therapy

## 2015-08-02 ENCOUNTER — Ambulatory Visit: Payer: BLUE CROSS/BLUE SHIELD | Admitting: Physical Therapy

## 2015-08-02 DIAGNOSIS — M25562 Pain in left knee: Secondary | ICD-10-CM

## 2015-08-02 DIAGNOSIS — M25662 Stiffness of left knee, not elsewhere classified: Secondary | ICD-10-CM

## 2015-08-02 DIAGNOSIS — M25462 Effusion, left knee: Secondary | ICD-10-CM

## 2015-08-02 DIAGNOSIS — R262 Difficulty in walking, not elsewhere classified: Secondary | ICD-10-CM

## 2015-08-02 NOTE — Therapy (Signed)
[place], [place], [address] Phone: [phone]   Fax:  [phone]  Physical Therapy Treatment  Patient Details  Name: Breanna Perry MRN: [id number] Date of Birth: May 28, 1956 No Data Recorded  Encounter Date: 08/02/2015      PT End of Session - 08/02/15 1646    Visit Number 3   Date for PT Re-Evaluation 09/19/15   PT Start Time 1600   PT Stop Time 1700   PT Time Calculation (min) 60 min      Past Medical History  Diagnosis Date  . Depression   . Sjogren's syndrome ([place])   . Anxiety   . Hypertension     under control with med., has been on med. x 8 yr.  . Dental bridge present     upper and lower  . Medial meniscus tear 12/2012    left    Past Surgical History  Procedure Laterality Date  . Appendectomy  04/1991  . Liver biopsy  summer 2011  . Lung biopsy  summer 2011  . Partial hysterectomy  04/1991    partial  . Knee arthroscopy Left 01/07/2013    Procedure: LEFT KNEE ARTHROSCOPY PARTIAL MEDIAL MENISECTOMY CHRONDOPLASTY MEDIAL PLICA RESECTION;  Surgeon: Alta Corning, MD;  Location: [facility];  Service: Orthopedics;  Laterality: Left;    There were no vitals filed for this visit.  Visit Diagnosis:  Left knee pain  Stiffness of left knee  Swelling of left knee joint  Difficulty walking      Subjective Assessment - 08/02/15 1557    Subjective Pt reports that things are going fine.    Currently in Pain? Yes   Pain Score 3    Pain Location Knee   Pain Orientation Left   Pain Descriptors / Indicators Aching                         [facility] Adult PT Treatment/Exercise - 08/02/15 0001    Ambulation/Gait   Gait Comments Able to negotiate 2 flights of stairs weak quad contraction eccentrically with RLE.     Knee/Hip Exercises: Aerobic   Stationary Bike L2 x  10 min    Knee/Hip Exercises: Machines for Strengthening   Cybex Knee Flexion 35#  2x15   Cybex Leg Press 60# 2x10, then single leg 2x10 #20    Knee/Hip Exercises: Standing   Other Standing Knee Exercises standing BOSU step up and overs 2x5    Knee/Hip Exercises: Seated   Long Arc Quad Left;2 sets;20 reps;Weights   Long Arc Quad Weight 3 lbs.   Other Seated Knee/Hip Exercises LLE march 2x20   Marching Weights 3 lbs.   Sit to Sand 2 sets;10 reps;without UE support  blue weighted ball.   Modalities   Modalities Vasopneumatic   Vasopneumatic   Number Minutes Vasopneumatic  15 minutes   Vasopnuematic Location  Knee   Vasopneumatic Pressure Medium   Vasopneumatic Temperature  34                  PT Short Term Goals - 07/20/15 1420    PT SHORT TERM GOAL #1   Title independent with initial HEP   Time 1   Period Weeks   Status New           PT Long Term Goals - 07/26/15 1646    PT LONG TERM GOAL #3   Title increase AROM  of the left knee to 0-120 degrees flexion   Status Partially Met   PT LONG TERM GOAL #4   Title independent with RICE               Plan - 08/02/15 1647    Clinical Impression Statement Pt performed well in PT this date able to progress to reciprocal stair negotiation with one rail. Pt does demo L quad weakness with eccentric contractions. Pt also able to progress to step ups on uneven surfaces requiring HHA    Pt will benefit from skilled therapeutic intervention in order to improve on the following deficits Abnormal gait;Decreased mobility;Decreased range of motion;Decreased strength;Difficulty walking;Impaired flexibility;Pain   Rehab Potential Good   PT Frequency 2x / week   PT Duration 8 weeks   PT Treatment/Interventions Electrical Stimulation;Cryotherapy;Therapeutic exercise;Vasopneumatic Device;Manual techniques;Functional mobility training;Stair training;Gait training;Balance training;Passive range of motion   PT Next Visit Plan R quad weakness ans gait         Problem List Patient Active Problem List    Diagnosis Date Noted  . Alveolar pneumopathy ([place]) 04/12/2011    Breanna Perry, Breanna Perry  08/02/2015, 4:50 PM  Breanna Perry [place], [place], [address] Phone: [phone]   Fax:  [phone]  Name: Breanna Perry MRN: [id number] Date of Birth: 1956/06/03

## 2015-08-04 ENCOUNTER — Ambulatory Visit: Payer: BLUE CROSS/BLUE SHIELD | Admitting: Physical Therapy

## 2015-08-09 ENCOUNTER — Ambulatory Visit: Payer: BLUE CROSS/BLUE SHIELD | Admitting: Physical Therapy

## 2015-08-10 ENCOUNTER — Ambulatory Visit: Payer: BLUE CROSS/BLUE SHIELD | Attending: Orthopaedic Surgery | Admitting: Physical Therapy

## 2015-08-10 ENCOUNTER — Encounter: Payer: Self-pay | Admitting: Physical Therapy

## 2015-08-10 DIAGNOSIS — M25662 Stiffness of left knee, not elsewhere classified: Secondary | ICD-10-CM | POA: Insufficient documentation

## 2015-08-10 DIAGNOSIS — M25562 Pain in left knee: Secondary | ICD-10-CM | POA: Diagnosis present

## 2015-08-10 DIAGNOSIS — M25462 Effusion, left knee: Secondary | ICD-10-CM | POA: Diagnosis not present

## 2015-08-10 DIAGNOSIS — R262 Difficulty in walking, not elsewhere classified: Secondary | ICD-10-CM | POA: Insufficient documentation

## 2015-08-10 NOTE — Therapy (Signed)
[place], [place], [address] Phone: [phone]   Fax:  [phone]  Physical Therapy Treatment  Patient Details  Name: Breanna Perry MRN: [id number] Date of Birth: July 13, 1956 No Data Recorded  Encounter Date: 08/10/2015      PT End of Session - 08/10/15 1219    Visit Number 4   Date for PT Re-Evaluation 09/19/15   PT Start Time 1136   PT Stop Time 1225   PT Time Calculation (min) 49 min   Activity Tolerance Patient tolerated treatment well   Behavior During Therapy [facility] for tasks assessed/performed      Past Medical History  Diagnosis Date  . Depression   . Sjogren's syndrome ([place])   . Anxiety   . Hypertension     under control with med., has been on med. x 8 yr.  . Dental bridge present     upper and lower  . Medial meniscus tear 12/2012    left    Past Surgical History  Procedure Laterality Date  . Appendectomy  04/1991  . Liver biopsy  summer 2011  . Lung biopsy  summer 2011  . Partial hysterectomy  04/1991    partial  . Knee arthroscopy Left 01/07/2013    Procedure: LEFT KNEE ARTHROSCOPY PARTIAL MEDIAL MENISECTOMY CHRONDOPLASTY MEDIAL PLICA RESECTION;  Surgeon: Alta Corning, MD;  Location: [place] [facility];  Service: Orthopedics;  Laterality: Left;    There were no vitals filed for this visit.  Visit Diagnosis:  Swelling of left knee joint  Difficulty walking  Stiffness of left knee  Left knee pain      Subjective Assessment - 08/10/15 1143    Subjective MD appointment yesterday, things are going fine    Currently in Pain? No/denies   Pain Score 0-No pain                         [facility] Adult PT Treatment/Exercise - 08/10/15 0001    Knee/Hip Exercises: Aerobic   Nustep Level 5 x 7 minutes   Knee/Hip Exercises: Machines for Strengthening   Cybex Knee Extension #20 2x15    Cybex Knee Flexion 35# 2x15   Cybex Leg Press 60# 2x10, then  single leg 2x10 #20    Knee/Hip Exercises: Standing   Forward Step Up Left;2 sets;10 reps;Step Height: 8"  #5   Other Standing Knee Exercises standing BOSU step up and overs 2x10    Knee/Hip Exercises: Seated   Sit to Sand 2 sets;10 reps;without UE support  blue weighted ball.    Modalities   Modalities Vasopneumatic   Vasopneumatic   Number Minutes Vasopneumatic  15 minutes   Vasopnuematic Location  Knee   Vasopneumatic Pressure Medium   Vasopneumatic Temperature  34                  PT Short Term Goals - 08/10/15 1223    PT SHORT TERM GOAL #1   Title independent with initial HEP   Status Achieved           PT Long Term Goals - 08/10/15 1223    PT LONG TERM GOAL #1   Title go up and down stairs step over step   Status Achieved   PT LONG TERM GOAL #2   Title walk all community distances without assistive device and minimal deviation   Status Partially Met   PT LONG TERM GOAL #3  Title increase AROM of the left knee to 0-120 degrees flexion   Status Partially Met   PT LONG TERM GOAL #4   Title independent with RICE   Status Achieved   PT LONG TERM GOAL #5   Title increase strength to 4+/5   Status Partially Met               Plan - 08/10/15 1221    Clinical Impression Statement reports seeing MD yesterday and things are going fine. Tolerated interventions that consisted of increased intensities. Some pain with leg extensions. Reports no functional limitations.   Pt will benefit from skilled therapeutic intervention in order to improve on the following deficits Abnormal gait;Decreased mobility;Decreased range of motion;Decreased strength;Difficulty walking;Impaired flexibility;Pain   Rehab Potential Good   PT Frequency 2x / week   PT Duration 8 weeks   PT Treatment/Interventions Electrical Stimulation;Cryotherapy;Therapeutic exercise;Vasopneumatic Device;Manual techniques;Functional mobility training;Stair training;Gait training;Balance  training;Passive range of motion   PT Next Visit Plan R quad weakness, Possible d/c        Problem List Patient Active Problem List   Diagnosis Date Noted  . Alveolar pneumopathy ([place]) 04/12/2011    Scot Jun, PTA  08/10/2015, 12:24 PM  [place], [place], [address] Phone: [phone]   Fax:  [phone]  Name: Breanna Perry MRN: [id number] Date of Birth: 1956/07/09

## 2015-08-11 ENCOUNTER — Encounter: Payer: Self-pay | Admitting: Physical Therapy

## 2015-08-11 ENCOUNTER — Ambulatory Visit: Payer: BLUE CROSS/BLUE SHIELD | Admitting: Physical Therapy

## 2015-08-11 DIAGNOSIS — M25662 Stiffness of left knee, not elsewhere classified: Secondary | ICD-10-CM

## 2015-08-11 DIAGNOSIS — R262 Difficulty in walking, not elsewhere classified: Secondary | ICD-10-CM

## 2015-08-11 DIAGNOSIS — M25462 Effusion, left knee: Secondary | ICD-10-CM | POA: Diagnosis not present

## 2015-08-11 DIAGNOSIS — M25562 Pain in left knee: Secondary | ICD-10-CM

## 2015-08-11 NOTE — Therapy (Signed)
[place], [place], [address] Phone: [phone]   Fax:  [phone]  Physical Therapy Treatment  Patient Details  Name: Breanna Perry MRN: [id number] Date of Birth: 11-18-1955 No Data Recorded  Encounter Date: 08/11/2015      PT End of Session - 08/11/15 1644    Visit Number 5   Date for PT Re-Evaluation 09/19/15   PT Start Time 1600   PT Stop Time 1644   PT Time Calculation (min) 44 min   Activity Tolerance Patient tolerated treatment well   Behavior During Therapy [facility] for tasks assessed/performed      Past Medical History  Diagnosis Date  . Depression   . Sjogren's syndrome ([place])   . Anxiety   . Hypertension     under control with med., has been on med. x 8 yr.  . Dental bridge present     upper and lower  . Medial meniscus tear 12/2012    left    Past Surgical History  Procedure Laterality Date  . Appendectomy  04/1991  . Liver biopsy  summer 2011  . Lung biopsy  summer 2011  . Partial hysterectomy  04/1991    partial  . Knee arthroscopy Left 01/07/2013    Procedure: LEFT KNEE ARTHROSCOPY PARTIAL MEDIAL MENISECTOMY CHRONDOPLASTY MEDIAL PLICA RESECTION;  Surgeon: Alta Corning, MD;  Location: [facility];  Service: Orthopedics;  Laterality: Left;    There were no vitals filed for this visit.  Visit Diagnosis:  Difficulty walking  Stiffness of left knee  Left knee pain  Swelling of left knee joint      Subjective Assessment - 08/11/15 1603    Subjective things are good   Currently in Pain? No/denies   Pain Score 0-No pain   Pain Location Knee   Pain Orientation Left            [facility] PT Assessment - 08/11/15 0001    AROM   Overall AROM Comments AROM of the left knee at edge of bed 1-118 degrees flexion, PROM 0-125 degrees flexion   Strength   Overall Strength Comments 5/5 with slight pain   Ambulation/Gait   Gait Comments Able to negotiate 1  flights of stairs [facility]                     [facility] Adult PT Treatment/Exercise - 08/11/15 0001    Knee/Hip Exercises: Aerobic   Stationary Bike L2 x  10 min    Knee/Hip Exercises: Machines for Strengthening   Cybex Knee Extension #20 2x15    Cybex Knee Flexion 35# 2x15   Cybex Leg Press 60# 2x15, then single leg 2x10 #20    Knee/Hip Exercises: Standing   Forward Step Up Left;2 sets;10 reps;Step Height: 8"  #5    Other Standing Knee Exercises standing BOSU step up and overs 2x10    Other Standing Knee Exercises Sit to stand on sit fit with blue weighted ball 2x10                   PT Short Term Goals - 08/10/15 1223    PT SHORT TERM GOAL #1   Title independent with initial HEP   Status Achieved           PT Long Term Goals - 08/11/15 1643    PT LONG TERM GOAL #2   Title walk all community distances without assistive device and  minimal deviation   Status Achieved   PT LONG TERM GOAL #3   Title increase AROM of the left knee to 0-120 degrees flexion   Status Partially Met   PT LONG TERM GOAL #5   Title increase strength to 4+/5   Status Achieved               Plan - 08/11/15 1645    Clinical Impression Statement Tolerated treatment well and has completed all goals.   Pt will benefit from skilled therapeutic intervention in order to improve on the following deficits Abnormal gait;Decreased mobility;Decreased range of motion;Decreased strength;Difficulty walking;Impaired flexibility;Pain   Rehab Potential Good   PT Frequency 2x / week   PT Treatment/Interventions Electrical Stimulation;Cryotherapy;Therapeutic exercise;Vasopneumatic Device;Manual techniques;Functional mobility training;Stair training;Gait training;Balance training;Passive range of motion   PT Next Visit Plan d/c        Problem List Patient Active Problem List   Diagnosis Date Noted  . Alveolar pneumopathy ([place]) 04/12/2011   PHYSICAL THERAPY DISCHARGE SUMMARY  Visits  from Start of Care: 5   Plan: Patient agrees to discharge.  Patient goals were met. Patient is being discharged due to being pleased with the current functional level.  ?????        Scot Jun, PTA  08/11/2015, 4:46 PM  [place], [place], [address] Phone: [phone]   Fax:  [phone]  Name: Breanna Perry MRN: [id number] Date of Birth: [birth year]

## 2015-08-16 ENCOUNTER — Ambulatory Visit: Payer: BLUE CROSS/BLUE SHIELD | Admitting: Physical Therapy

## 2015-08-18 ENCOUNTER — Ambulatory Visit: Payer: BLUE CROSS/BLUE SHIELD | Admitting: Physical Therapy

## 2017-02-05 DIAGNOSIS — F329 Major depressive disorder, single episode, unspecified: Secondary | ICD-10-CM | POA: Diagnosis not present

## 2017-04-09 DIAGNOSIS — Z5181 Encounter for therapeutic drug level monitoring: Secondary | ICD-10-CM | POA: Diagnosis not present

## 2017-04-09 DIAGNOSIS — F419 Anxiety disorder, unspecified: Secondary | ICD-10-CM | POA: Diagnosis not present

## 2017-04-18 DIAGNOSIS — N183 Chronic kidney disease, stage 3 (moderate): Secondary | ICD-10-CM | POA: Diagnosis not present

## 2017-04-18 DIAGNOSIS — E782 Mixed hyperlipidemia: Secondary | ICD-10-CM | POA: Diagnosis not present

## 2017-04-18 DIAGNOSIS — K754 Autoimmune hepatitis: Secondary | ICD-10-CM | POA: Diagnosis not present

## 2017-04-18 DIAGNOSIS — I10 Essential (primary) hypertension: Secondary | ICD-10-CM | POA: Diagnosis not present

## 2017-04-18 DIAGNOSIS — Z0000 Encounter for general adult medical examination without abnormal findings: Secondary | ICD-10-CM | POA: Diagnosis not present

## 2017-04-19 ENCOUNTER — Encounter: Payer: Self-pay | Admitting: Neurology

## 2017-04-29 NOTE — Progress Notes (Signed)
Subjective:   Breanna Perry was seen in consultation in the movement disorder clinic at the request of [name], MD.  The evaluation is for tremor.  Tremor started approximately several years ago and it was intermittent then but it has always arisen out of sleep.  It is more frequent now but still arises out of sleep.  It occurs nightly.  It awakens her from sleep.  She cannot stop it.  It is rhythmic.  Once she awakens, it will only last a few seconds (10-20 seconds) and then it will go away.  She never has it before going to sleep.  She does snore.  She does occasionally wake herself up snoring.  She gets in bed between 8:30-9pm but does not doze off until 10pm-2am.  She gets up in the AM at 6am spontaneously.  She never feels refreshed.  She doesn't doze in the day.  She generally is not that sleepy.  She doesn't think that the phentermine affects her sleep.  She just restarted that last month.  She was on klonopin in the past but that was for anxiety and not for the movements in the legs.   No Known Allergies  Outpatient Encounter Prescriptions as of 05/13/2017  Medication Sig  . cetirizine (ZYRTEC) 10 MG tablet Take 10 mg by mouth daily.  . citalopram (CELEXA) 20 MG tablet Take 20 mg by mouth daily.  . hydrOXYzine (VISTARIL) 25 MG capsule Take 25 mg by mouth as needed.  . meloxicam (MOBIC) 15 MG tablet Take 15 mg by mouth daily as needed. with food  . metoprolol (TOPROL-XL) 50 MG 24 hr tablet Take 50 mg by mouth 2 (two) times daily.   . Olmesartan-Amlodipine-HCTZ (TRIBENZOR) 40-10-25 MG TABS Take by mouth daily.   . phentermine 37.5 MG capsule Take 37.5 mg by mouth as needed.  . traMADol (ULTRAM) 50 MG tablet Take by mouth every 6 (six) hours as needed.  Earnestine Mealing 625 MG tablet Take 3 tablets by mouth 2 (two) times daily.  Marland Kitchen ZETIA 10 MG tablet   . [DISCONTINUED] clonazePAM (KLONOPIN) 0.5 MG tablet Take 0.5 mg by mouth 2 (two) times daily.   . [DISCONTINUED] diclofenac (VOLTAREN)  25 MG EC tablet Take 25 mg by mouth 2 (two) times daily.  . [DISCONTINUED] doxepin (SINEQUAN) 25 MG capsule Take 25 mg by mouth.  . [DISCONTINUED] DULoxetine (CYMBALTA) 60 MG capsule Take 60 mg by mouth daily.    No facility-administered encounter medications on file as of 05/13/2017.     Past Medical History:  Diagnosis Date  . Anxiety   . Dental bridge present    upper and lower  . Depression   . Hypertension    under control with med., has been on med. x 8 yr.  . Medial meniscus tear 12/2012   left  . Sjogren's syndrome [facility])     Past Surgical History:  Procedure Laterality Date  . APPENDECTOMY  04/1991  . KNEE ARTHROSCOPY Left 01/07/2013   Procedure: LEFT KNEE ARTHROSCOPY PARTIAL MEDIAL MENISECTOMY CHRONDOPLASTY MEDIAL PLICA RESECTION;  Surgeon: Alta Corning, MD;  Location: [facility];  Service: Orthopedics;  Laterality: Left;  . KNEE SURGERY  06/22/2015  . LIVER BIOPSY  summer 2011  . LUNG BIOPSY  summer 2011  . PARTIAL HYSTERECTOMY  04/1991   partial    Social History   Social History  . Marital status: Single    Spouse name: N/A  . Number of children: N  .  Years of education: N/A   Occupational History  . parts specialist [employer]     Social History Main Topics  . Smoking status: Former Smoker    Packs/day: 1.00    Years: 31.00    Types: Cigarettes    Quit date: 10/08/2000  . Smokeless tobacco: Never Used  . Alcohol use Yes     Comment: rarely  . Drug use: No  . Sexual activity: Not on file   Other Topics Concern  . Not on file   Social History Narrative  . No narrative on file    Family Status  Relation Status  . Father Deceased at age 64  . Mother Deceased at age 27  . PGF Deceased  . PGM Deceased  . MGF Deceased  . MGM Deceased  . Brother Deceased at age 8  . Brother Alive  . Brother Alive  . Sister Alive  . Sister Alive  . Sister Alive    Review of Systems Some SOB with anxiety.  A complete 10 system ROS was  obtained and was negative apart from what is mentioned.   Objective:   VITALS:   Vitals:   05/13/17 1016  BP: 130/80  Pulse: 72  SpO2: 97%  Weight: 188 lb (85.3 kg)  Height: 5\' 4"  (1.626 m)   Gen:  Appears stated age and in NAD. HEENT:  Normocephalic, atraumatic. The mucous membranes are moist. The superficial temporal arteries are without ropiness or tenderness. Cardiovascular: Regular rate and rhythm. Lungs: Clear to auscultation bilaterally. Neck: There are no carotid bruits noted bilaterally.  NEUROLOGICAL:  Orientation:  The patient is alert and oriented x 3.  Recent and remote memory are intact.  Attention span and concentration are normal.  Able to name objects and repeat without trouble.  Fund of knowledge is appropriate Cranial nerves: There is good facial symmetry. The pupils are equal round and reactive to light bilaterally. Fundoscopic exam reveals clear disc margins bilaterally. Extraocular muscles are intact and visual fields are full to confrontational testing. Speech is fluent and clear. Soft palate rises symmetrically and there is no tongue deviation. Hearing is intact to conversational tone. Tone: Tone is good throughout. Sensation: Sensation is intact to light touch and pinprick throughout (facial, trunk, extremities). Vibration is intact at the bilateral big toe. There is no extinction with double simultaneous stimulation. There is no sensory dermatomal level identified. Coordination:  The patient has no dysdiadichokinesia or dysmetria. Motor: Strength is 5/5 in the bilateral upper and lower extremities.  Shoulder shrug is equal bilaterally.  There is no pronator drift.  There are no fasciculations noted. DTR's: Deep tendon reflexes are 2/4 at the bilateral biceps, triceps, brachioradialis, patella and achilles.  Plantar responses are downgoing bilaterally. Gait and Station: The patient is able to ambulate without difficulty. The patient is able to heel toe walk  without any difficulty. The patient is able to ambulate in a tandem fashion. The patient is able to stand in the Romberg position.   MOVEMENT EXAM: Tremor:  There is no tremor in the UE, noted most significantly with action.   There is no tremor at rest.    Labs:  Lab work was done on 04/18/2017.  I have reviewed that.  Sodium was 141, potassium 4.1, chloride 107, CO2 29, BUN 19, creatinine 1.04, white blood cells were 3.4, hemoglobin 12.7, hematocrit 38.1, platelets 267.  Total cholesterol was elevated at 217 with an HDL of 56 and LDL which was elevated at 143.  Assessment/Plan:   1.  "Tremor"  -suspect that this is PLMD and not true tremor.  Nothing suspicious for seizure on history.  We will order PSG.   Hopefully we will be able to capture this if she is able to sleep.  If not, may empirically tx with klonopin.  -will do a TSH.  Have labs back to 2016 and don't see that one has been done in the recent past.  Will also do ferritin, iron studies.  2.  F/u will depend on the above.  Much greater than 50% of this visit was spent in counseling and coordinating care.  Total face to face time:  45 min   CC:  [name], MD

## 2017-05-13 ENCOUNTER — Encounter: Payer: Self-pay | Admitting: Neurology

## 2017-05-13 ENCOUNTER — Other Ambulatory Visit (INDEPENDENT_AMBULATORY_CARE_PROVIDER_SITE_OTHER): Payer: BLUE CROSS/BLUE SHIELD

## 2017-05-13 ENCOUNTER — Ambulatory Visit (INDEPENDENT_AMBULATORY_CARE_PROVIDER_SITE_OTHER): Payer: BLUE CROSS/BLUE SHIELD | Admitting: Neurology

## 2017-05-13 VITALS — BP 130/80 | HR 72 | Ht 64.0 in | Wt 188.0 lb

## 2017-05-13 DIAGNOSIS — D72818 Other decreased white blood cell count: Secondary | ICD-10-CM | POA: Diagnosis not present

## 2017-05-13 DIAGNOSIS — G4761 Periodic limb movement disorder: Secondary | ICD-10-CM

## 2017-05-13 DIAGNOSIS — R251 Tremor, unspecified: Secondary | ICD-10-CM | POA: Diagnosis not present

## 2017-05-13 LAB — TSH: TSH: 3.55 u[IU]/mL (ref 0.35–4.50)

## 2017-05-13 LAB — IRON: Iron: 64 ug/dL (ref 42–145)

## 2017-05-13 LAB — FERRITIN: FERRITIN: 74 ng/mL (ref 10.0–291.0)

## 2017-05-13 NOTE — Patient Instructions (Signed)
1. Your provider has requested that you have labwork completed today. Please go to [facility] ([address]) on the second floor of this building before leaving the office today. You do not need to check in. If you are not called within 15 minutes please check with the front desk.   2. We have ordered a sleep study. These are performed at [facility]. They should call you to schedule this study. If you don't hear from them please contact them at [phone].

## 2017-05-14 ENCOUNTER — Telehealth: Payer: Self-pay | Admitting: Neurology

## 2017-05-14 NOTE — Telephone Encounter (Signed)
Mychart message sent to patient.

## 2017-05-14 NOTE — Telephone Encounter (Signed)
-----   Message from [place], DO sent at 05/13/2017  5:15 PM EDT ----- Let pt know that labs are okay (i didn't get percent sat - did we not order that)

## 2017-05-15 DIAGNOSIS — M25562 Pain in left knee: Secondary | ICD-10-CM | POA: Diagnosis not present

## 2017-07-17 ENCOUNTER — Ambulatory Visit (HOSPITAL_BASED_OUTPATIENT_CLINIC_OR_DEPARTMENT_OTHER): Payer: BLUE CROSS/BLUE SHIELD | Attending: Neurology | Admitting: Internal Medicine

## 2017-07-17 VITALS — Ht 64.0 in | Wt 181.0 lb

## 2017-07-17 DIAGNOSIS — G4733 Obstructive sleep apnea (adult) (pediatric): Secondary | ICD-10-CM | POA: Insufficient documentation

## 2017-07-17 DIAGNOSIS — G4761 Periodic limb movement disorder: Secondary | ICD-10-CM | POA: Diagnosis not present

## 2017-07-21 DIAGNOSIS — G4761 Periodic limb movement disorder: Secondary | ICD-10-CM | POA: Diagnosis not present

## 2017-07-21 NOTE — Procedures (Signed)
  Patient Name: Breanna Perry, [place] Date: 07/17/2017 Gender: Female D.O.B: 01-Jan-1956 Age (years): 3 Referring Provider: Wells Guiles Tat Height (inches): 23 Interpreting Physician: Baird Lyons MD, ABSM Weight (lbs): 181 RPSGT: Carolin Coy BMI: 31 MRN: [id number] Neck Size: 13.50 CLINICAL INFORMATION Sleep Study Type: NPSG  Indication for sleep study: Fatigue, Hypertension, Obesity, Periodic Limb Movement Disorder, Snoring  Epworth Sleepiness Score: 5  SLEEP STUDY TECHNIQUE As per the AASM Manual for the Scoring of Sleep and Associated Events v2.3 (April 2016) with a hypopnea requiring 4% desaturations.  The channels recorded and monitored were frontal, central and occipital EEG, electrooculogram (EOG), submentalis EMG (chin), nasal and oral airflow, thoracic and abdominal wall motion, anterior tibialis EMG, snore microphone, electrocardiogram, and pulse oximetry.  MEDICATIONS Medications self-administered by patient taken the night of the study : [place] The study was initiated at 9:40:34 PM and ended at 4:34:22 AM.  Sleep onset time was 19.4 minutes and the sleep efficiency was 82.9%. The total sleep time was 343.0 minutes.  Stage REM latency was 109.5 minutes.  The patient spent 12.83% of the night in stage N1 sleep, 79.30% in stage N2 sleep, 0.29% in stage N3 and 7.58% in REM.  Alpha intrusion was absent.  Supine sleep was 40.82%.  RESPIRATORY PARAMETERS The overall apnea/hypopnea index (AHI) was 16.6 per hour. There were 18 total apneas, including 15 obstructive, 2 central and 1 mixed apneas. There were 77 hypopneas and 24 RERAs.  The AHI during Stage REM sleep was 48.5 per hour.  AHI while supine was 27.9 per hour.  The mean oxygen saturation was 92.85%. The minimum SpO2 during sleep was 75.00%.  loud snoring was noted during this study.  CARDIAC DATA The 2 lead EKG demonstrated sinus rhythm. The mean heart rate was 60.97  beats per minute. Other EKG findings include: None.  LEG MOVEMENT DATA The total PLMS were 0 with a resulting PLMS index of 0.00. Associated arousal with leg movement index was 0.0 .  IMPRESSIONS - Moderate obstructive sleep apnea occurred during this study (AHI = 16.6/h). - No significant central sleep apnea occurred during this study (CAI = 0.3/h). - Moderate oxygen desaturation was noted during this study (Min O2 = 75.00%). - The patient snored with loud snoring volume. - No cardiac abnormalities were noted during this study. - Clinically significant periodic limb movements did not occur during sleep. No significant associated arousals.  DIAGNOSIS - Obstructive Sleep Apnea (327.23 [G47.33 ICD-10]) - Nocturnal Hypoxemia (327.26 [G47.36 ICD-10])  RECOMMENDATIONS - Therapeutic CPAP titration to determine optimal pressure required to alleviate sleep disordered breathing. - Positional therapy avoiding supine position during sleep. - Be careful with alcohol, sedatives and other CNS depressants that may worsen sleep apnea and disrupt normal sleep architecture. - Sleep hygiene should be reviewed to assess factors that may improve sleep quality. - Weight management and regular exercise should be initiated or continued if appropriate.  [Electronically signed] 07/21/2017 03:28 PM  Baird Lyons MD, [place], American Board of Sleep Medicine   NPI: [id number]  [place], American Board of Sleep Medicine  ELECTRONICALLY SIGNED ON:  07/21/2017, 3:24 PM [place] PH: [phone]   FX: [phone] [place]

## 2017-07-22 ENCOUNTER — Telehealth: Payer: Self-pay | Admitting: Neurology

## 2017-07-22 NOTE — Telephone Encounter (Signed)
-----   Message from [place], DO sent at 07/22/2017  7:34 AM EDT ----- Breanna Perry, on PSG, patient didn't have PLM's (on this study) but did have OSAS.  We should send back for CPAP titration and see try to treat for that if patient agreeable

## 2017-07-22 NOTE — Telephone Encounter (Signed)
Left message on machine for patient to call back.

## 2017-07-23 ENCOUNTER — Encounter: Payer: Self-pay | Admitting: Neurology

## 2017-07-23 NOTE — Telephone Encounter (Signed)
This encounter was created in error - please disregard.

## 2017-07-23 NOTE — Telephone Encounter (Signed)
Mychart message sent to patient.

## 2017-07-31 ENCOUNTER — Encounter: Payer: Self-pay | Admitting: Neurology

## 2017-07-31 NOTE — Telephone Encounter (Signed)
This encounter was created in error - please disregard.

## 2017-12-10 DIAGNOSIS — F411 Generalized anxiety disorder: Secondary | ICD-10-CM | POA: Diagnosis not present

## 2017-12-11 DIAGNOSIS — Z1231 Encounter for screening mammogram for malignant neoplasm of breast: Secondary | ICD-10-CM | POA: Diagnosis not present

## 2018-01-09 DIAGNOSIS — F411 Generalized anxiety disorder: Secondary | ICD-10-CM | POA: Diagnosis not present

## 2018-03-04 DIAGNOSIS — F411 Generalized anxiety disorder: Secondary | ICD-10-CM | POA: Diagnosis not present

## 2018-03-25 DIAGNOSIS — M1711 Unilateral primary osteoarthritis, right knee: Secondary | ICD-10-CM | POA: Diagnosis not present

## 2018-03-25 DIAGNOSIS — M1712 Unilateral primary osteoarthritis, left knee: Secondary | ICD-10-CM | POA: Diagnosis not present

## 2018-03-25 DIAGNOSIS — M25562 Pain in left knee: Secondary | ICD-10-CM | POA: Diagnosis not present

## 2018-05-13 DIAGNOSIS — F329 Major depressive disorder, single episode, unspecified: Secondary | ICD-10-CM | POA: Diagnosis not present

## 2018-05-19 DIAGNOSIS — H9313 Tinnitus, bilateral: Secondary | ICD-10-CM | POA: Diagnosis not present

## 2018-05-19 DIAGNOSIS — R21 Rash and other nonspecific skin eruption: Secondary | ICD-10-CM | POA: Diagnosis not present

## 2018-05-19 DIAGNOSIS — G4733 Obstructive sleep apnea (adult) (pediatric): Secondary | ICD-10-CM | POA: Diagnosis not present

## 2018-05-22 DIAGNOSIS — M25562 Pain in left knee: Secondary | ICD-10-CM | POA: Diagnosis not present

## 2018-05-22 DIAGNOSIS — Z96652 Presence of left artificial knee joint: Secondary | ICD-10-CM | POA: Diagnosis not present

## 2018-05-22 DIAGNOSIS — M25062 Hemarthrosis, left knee: Secondary | ICD-10-CM | POA: Diagnosis not present

## 2018-05-22 DIAGNOSIS — M25462 Effusion, left knee: Secondary | ICD-10-CM | POA: Diagnosis not present

## 2018-05-30 DIAGNOSIS — L986 Other infiltrative disorders of the skin and subcutaneous tissue: Secondary | ICD-10-CM | POA: Diagnosis not present

## 2018-05-30 DIAGNOSIS — S90561A Insect bite (nonvenomous), right ankle, initial encounter: Secondary | ICD-10-CM | POA: Diagnosis not present

## 2018-06-02 DIAGNOSIS — H903 Sensorineural hearing loss, bilateral: Secondary | ICD-10-CM | POA: Insufficient documentation

## 2018-06-02 DIAGNOSIS — Z77122 Contact with and (suspected) exposure to noise: Secondary | ICD-10-CM | POA: Diagnosis not present

## 2018-06-02 DIAGNOSIS — H9313 Tinnitus, bilateral: Secondary | ICD-10-CM | POA: Insufficient documentation

## 2018-06-02 DIAGNOSIS — H6123 Impacted cerumen, bilateral: Secondary | ICD-10-CM | POA: Insufficient documentation

## 2018-08-12 DIAGNOSIS — F411 Generalized anxiety disorder: Secondary | ICD-10-CM | POA: Diagnosis not present

## 2018-09-29 ENCOUNTER — Inpatient Hospital Stay (HOSPITAL_COMMUNITY): Payer: BLUE CROSS/BLUE SHIELD

## 2018-09-29 ENCOUNTER — Emergency Department (HOSPITAL_COMMUNITY): Payer: BLUE CROSS/BLUE SHIELD

## 2018-09-29 ENCOUNTER — Inpatient Hospital Stay (HOSPITAL_COMMUNITY)
Admission: EM | Admit: 2018-09-29 | Discharge: 2018-09-30 | DRG: 063 | Disposition: A | Discharge status: Home / Self Care | Payer: BLUE CROSS/BLUE SHIELD | Attending: Neurology | Admitting: Neurology

## 2018-09-29 ENCOUNTER — Encounter (HOSPITAL_COMMUNITY): Payer: Self-pay | Admitting: Emergency Medicine

## 2018-09-29 ENCOUNTER — Other Ambulatory Visit: Payer: Self-pay

## 2018-09-29 DIAGNOSIS — I361 Nonrheumatic tricuspid (valve) insufficiency: Secondary | ICD-10-CM | POA: Diagnosis not present

## 2018-09-29 DIAGNOSIS — I370 Nonrheumatic pulmonary valve stenosis: Secondary | ICD-10-CM | POA: Diagnosis not present

## 2018-09-29 DIAGNOSIS — R29704 NIHSS score 4: Secondary | ICD-10-CM | POA: Diagnosis present

## 2018-09-29 DIAGNOSIS — Z79899 Other long term (current) drug therapy: Secondary | ICD-10-CM

## 2018-09-29 DIAGNOSIS — G4489 Other headache syndrome: Secondary | ICD-10-CM | POA: Diagnosis not present

## 2018-09-29 DIAGNOSIS — R29810 Facial weakness: Secondary | ICD-10-CM | POA: Diagnosis present

## 2018-09-29 DIAGNOSIS — E669 Obesity, unspecified: Secondary | ICD-10-CM | POA: Diagnosis not present

## 2018-09-29 DIAGNOSIS — I639 Cerebral infarction, unspecified: Secondary | ICD-10-CM | POA: Diagnosis not present

## 2018-09-29 DIAGNOSIS — Z87891 Personal history of nicotine dependence: Secondary | ICD-10-CM

## 2018-09-29 DIAGNOSIS — Z6832 Body mass index (BMI) 32.0-32.9, adult: Secondary | ICD-10-CM

## 2018-09-29 DIAGNOSIS — Z791 Long term (current) use of non-steroidal anti-inflammatories (NSAID): Secondary | ICD-10-CM | POA: Diagnosis not present

## 2018-09-29 DIAGNOSIS — R52 Pain, unspecified: Secondary | ICD-10-CM | POA: Diagnosis not present

## 2018-09-29 DIAGNOSIS — F419 Anxiety disorder, unspecified: Secondary | ICD-10-CM | POA: Diagnosis present

## 2018-09-29 DIAGNOSIS — R51 Headache: Secondary | ICD-10-CM | POA: Diagnosis not present

## 2018-09-29 DIAGNOSIS — R471 Dysarthria and anarthria: Secondary | ICD-10-CM | POA: Diagnosis present

## 2018-09-29 DIAGNOSIS — R4781 Slurred speech: Secondary | ICD-10-CM | POA: Diagnosis not present

## 2018-09-29 DIAGNOSIS — M3500 Sicca syndrome, unspecified: Secondary | ICD-10-CM | POA: Diagnosis not present

## 2018-09-29 DIAGNOSIS — I63233 Cerebral infarction due to unspecified occlusion or stenosis of bilateral carotid arteries: Secondary | ICD-10-CM | POA: Diagnosis not present

## 2018-09-29 DIAGNOSIS — R531 Weakness: Secondary | ICD-10-CM | POA: Diagnosis not present

## 2018-09-29 DIAGNOSIS — F329 Major depressive disorder, single episode, unspecified: Secondary | ICD-10-CM | POA: Diagnosis present

## 2018-09-29 DIAGNOSIS — I63511 Cerebral infarction due to unspecified occlusion or stenosis of right middle cerebral artery: Secondary | ICD-10-CM | POA: Diagnosis present

## 2018-09-29 DIAGNOSIS — R4020 Unspecified coma: Secondary | ICD-10-CM | POA: Diagnosis not present

## 2018-09-29 DIAGNOSIS — E785 Hyperlipidemia, unspecified: Secondary | ICD-10-CM | POA: Diagnosis not present

## 2018-09-29 DIAGNOSIS — I10 Essential (primary) hypertension: Secondary | ICD-10-CM | POA: Diagnosis not present

## 2018-09-29 LAB — DIFFERENTIAL
ABS IMMATURE GRANULOCYTES: 0.01 10*3/uL (ref 0.00–0.07)
Basophils Absolute: 0 10*3/uL (ref 0.0–0.1)
Basophils Relative: 1 %
EOS ABS: 0 10*3/uL (ref 0.0–0.5)
Eosinophils Relative: 1 %
IMMATURE GRANULOCYTES: 0 %
Lymphocytes Relative: 34 %
Lymphs Abs: 1.2 10*3/uL (ref 0.7–4.0)
Monocytes Absolute: 0.3 10*3/uL (ref 0.1–1.0)
Monocytes Relative: 9 %
Neutro Abs: 1.9 10*3/uL (ref 1.7–7.7)
Neutrophils Relative %: 55 %

## 2018-09-29 LAB — COMPREHENSIVE METABOLIC PANEL
ALT: 23 U/L (ref 0–44)
AST: 19 U/L (ref 15–41)
Albumin: 4 g/dL (ref 3.5–5.0)
Alkaline Phosphatase: 49 U/L (ref 38–126)
Anion gap: 10 (ref 5–15)
BUN: 10 mg/dL (ref 8–23)
CO2: 27 mmol/L (ref 22–32)
Calcium: 9.6 mg/dL (ref 8.9–10.3)
Chloride: 102 mmol/L (ref 98–111)
Creatinine, Ser: 1.07 mg/dL — ABNORMAL HIGH (ref 0.44–1.00)
GFR calc Af Amer: >60 mL/min (ref >60)
GFR calc non Af Amer: 56 mL/min — ABNORMAL LOW (ref >60)
GLUCOSE: 94 mg/dL (ref 70–99)
Potassium: 3.3 mmol/L — ABNORMAL LOW (ref 3.5–5.1)
Sodium: 139 mmol/L (ref 135–145)
TOTAL PROTEIN: 7.3 g/dL (ref 6.5–8.1)
Total Bilirubin: 1 mg/dL (ref 0.3–1.2)

## 2018-09-29 LAB — URINALYSIS, ROUTINE W REFLEX MICROSCOPIC
Bilirubin Urine: NEGATIVE
Glucose, UA: NEGATIVE mg/dL
Hgb urine dipstick: NEGATIVE
Ketones, ur: NEGATIVE mg/dL
LEUKOCYTES UA: NEGATIVE
Nitrite: NEGATIVE
Protein, ur: NEGATIVE mg/dL
Specific Gravity, Urine: 1.019 (ref 1.005–1.030)
pH: 6 (ref 5.0–8.0)

## 2018-09-29 LAB — CBC
HCT: 36.9 % (ref 36.0–46.0)
Hemoglobin: 12.2 g/dL (ref 12.0–15.0)
MCH: 31.1 pg (ref 26.0–34.0)
MCHC: 33.1 g/dL (ref 30.0–36.0)
MCV: 94.1 fL (ref 80.0–100.0)
NRBC: 0 % (ref 0.0–0.2)
Platelets: 337 10*3/uL (ref 150–400)
RBC: 3.92 MIL/uL (ref 3.87–5.11)
RDW: 14.7 % (ref 11.5–15.5)
WBC: 3.5 10*3/uL — ABNORMAL LOW (ref 4.0–10.5)

## 2018-09-29 LAB — CBG MONITORING, ED: Glucose-Capillary: 90 mg/dL (ref 70–99)

## 2018-09-29 LAB — RAPID URINE DRUG SCREEN, HOSP PERFORMED
Amphetamines: NOT DETECTED
Barbiturates: NOT DETECTED
Benzodiazepines: NOT DETECTED
Cocaine: NOT DETECTED
Opiates: NOT DETECTED
Tetrahydrocannabinol: NOT DETECTED

## 2018-09-29 LAB — I-STAT CHEM 8, ED
BUN: 11 mg/dL (ref 8–23)
Calcium, Ion: 1.19 mmol/L (ref 1.15–1.40)
Chloride: 104 mmol/L (ref 98–111)
Creatinine, Ser: 1.1 mg/dL — ABNORMAL HIGH (ref 0.44–1.00)
Glucose, Bld: 88 mg/dL (ref 70–99)
HEMATOCRIT: 38 % (ref 36.0–46.0)
Hemoglobin: 12.9 g/dL (ref 12.0–15.0)
Potassium: 3.3 mmol/L — ABNORMAL LOW (ref 3.5–5.1)
Sodium: 139 mmol/L (ref 135–145)
TCO2: 27 mmol/L (ref 22–32)

## 2018-09-29 LAB — ECHOCARDIOGRAM COMPLETE
Height: 64 in
Weight: 3065.28 oz

## 2018-09-29 LAB — PROTIME-INR
INR: 0.96
Prothrombin Time: 12.6 seconds (ref 11.4–15.2)

## 2018-09-29 LAB — ETHANOL: Alcohol, Ethyl (B): <10 mg/dL (ref <10)

## 2018-09-29 LAB — MRSA PCR SCREENING: MRSA by PCR: NEGATIVE

## 2018-09-29 LAB — APTT: aPTT: 30 seconds (ref 24–36)

## 2018-09-29 LAB — I-STAT TROPONIN, ED: Troponin i, poc: 0 ng/mL (ref 0.00–0.08)

## 2018-09-29 MED ORDER — ACETAMINOPHEN 325 MG PO TABS: 650.0000 mg | ORAL_TABLET | ORAL | Status: DC | PRN

## 2018-09-29 MED ORDER — SODIUM CHLORIDE 0.9 % IV SOLN
50.0000 mL | Freq: Once | INTRAVENOUS | Status: AC
  Administered 2018-09-29: 50 mL via INTRAVENOUS

## 2018-09-29 MED ORDER — PANTOPRAZOLE SODIUM 40 MG IV SOLR
40.0000 mg | Freq: Every day | INTRAVENOUS | Status: DC
  Filled 2018-09-29: qty 40

## 2018-09-29 MED ORDER — STROKE: EARLY STAGES OF RECOVERY BOOK
Freq: Once | Status: AC
  Administered 2018-09-29: 14:00:00

## 2018-09-29 MED ORDER — IOPAMIDOL (ISOVUE-370) INJECTION 76%
50.0000 mL | Freq: Once | INTRAVENOUS | Status: AC | PRN
  Administered 2018-09-29: 50 mL via INTRAVENOUS

## 2018-09-29 MED ORDER — ACETAMINOPHEN 160 MG/5ML PO SOLN: 650.0000 mg | ORAL | Status: DC | PRN

## 2018-09-29 MED ORDER — SODIUM CHLORIDE 0.9 % IV SOLN
INTRAVENOUS | Status: DC
  Administered 2018-09-29 (×2): via INTRAVENOUS

## 2018-09-29 MED ORDER — ALTEPLASE (STROKE) FULL DOSE INFUSION
0.9000 mg/kg | Freq: Once | INTRAVENOUS | Status: AC
  Administered 2018-09-29: 78.2 mg via INTRAVENOUS
  Filled 2018-09-29: qty 100

## 2018-09-29 MED ORDER — ACETAMINOPHEN 650 MG RE SUPP: 650.0000 mg | RECTAL | Status: DC | PRN

## 2018-09-29 NOTE — Plan of Care (Signed)
Patient progressing well through care plan.

## 2018-09-29 NOTE — Code Documentation (Signed)
62 yo Female with Past Hx of HTN, Sjorgen's Syndrome, and Hypercholesteremia presents with sudden onset of left sided facial droop and slurred speech. Pt was at work when she had a sudden onset of right sided headache. Coworker noticed the facial droop and called EMS. EMS activated a Code Stroke. Stroke Team met patient upon arrival to the ED. Initial NIHSS 3 due to left facial droop, decreased sensation to the left, and dysarthria. Pt originally too mild to treat. Dr. Lorraine Lax ordered CTA. Completed and showed distal M2 occlusion. MD spoke with patient and verbalized understanding of tPA. Patient consented and tPA started at 1215.

## 2018-09-29 NOTE — Code Documentation (Signed)
Patient began to complaing of HA at 1300. MD made aware and tPA stopped. Pt taken to CT. No hemorrhage noted. tPA restarted at 1320. Patient taken to [facility] and Handoff given to [place], Therapist, sports.

## 2018-09-29 NOTE — Consult Note (Deleted)
Neurology Consultation  Reason for Consult: Code stroke Referring Physician: Emergency department physician  CC: Dysarthria, left facial droop, decreased sensation on the left  History is obtained from: EMS and patient  HPI: Breanna Perry is a 62 y.o. female with history of tinnitus, hypertension, hyperlipidemia and Sjogren's disease.  Patient was at work when she suddenly felt headache at approximately 10:30 in the morning and then after this her coworkers noticed that she had a left-sided facial droop.  She does note that she did have a headache last night some blurred vision which was still present in the morning.  She denies having headaches or migraines in the past.  Denies taking aspirin.  On arriving at the hospital patient continued to have a left facial droop, significant dysarthria and decreased sensation to the left arm.  CT was obtained immediately and was negative.  Thus CTA of head and neck are going to be obtained in addition MRI.  Chart review (prior neurology notes/ discharge) patient has been seen by neurology in the past for tremor and also tinnitus   LKW: 10 AM on 09/29/2018 tpa given?: no, minimal symptoms with NIH of 3 Premorbid modified Rankin scale (mRS): 0 NIH stroke scale of 4 ROS: A 14 point ROS was performed and is negative except as noted in the HPI.   Past Medical History:  Diagnosis Date  . Anxiety   . Dental bridge present    upper and lower  . Depression   . Hypertension    under control with med., has been on med. x 8 yr.  . Medial meniscus tear 12/2012   left  . Sjogren's syndrome ([place])     Family History  Problem Relation Age of Onset  . Hypertension Father   . Emphysema Father   . Heart attack Father   . Cirrhosis Father   . Coronary artery disease Mother   . Heart failure Mother   . COPD Mother   . Breast cancer Maternal Grandmother   . HIV Brother   . Diabetes Sister     Social History:   reports that she quit smoking about 17  years ago. Her smoking use included cigarettes. She has a 31.00 pack-year smoking history. She has never used smokeless tobacco. She reports current alcohol use. She reports that she does not use drugs.  Medications No current facility-administered medications for this encounter.   Current Outpatient Medications:  .  cetirizine (ZYRTEC) 10 MG tablet, Take 10 mg by mouth daily., Disp: , Rfl:  .  citalopram (CELEXA) 20 MG tablet, Take 20 mg by mouth daily., Disp: , Rfl: 1 .  hydrOXYzine (VISTARIL) 25 MG capsule, Take 25 mg by mouth as needed., Disp: , Rfl: 2 .  meloxicam (MOBIC) 15 MG tablet, Take 15 mg by mouth daily as needed. with food, Disp: , Rfl: 1 .  metoprolol (TOPROL-XL) 50 MG 24 hr tablet, Take 50 mg by mouth 2 (two) times daily. , Disp: , Rfl:  .  Olmesartan-Amlodipine-HCTZ (TRIBENZOR) 40-10-25 MG TABS, Take by mouth daily. , Disp: , Rfl:  .  phentermine 37.5 MG capsule, Take 37.5 mg by mouth as needed., Disp: , Rfl:  .  traMADol (ULTRAM) 50 MG tablet, Take by mouth every 6 (six) hours as needed., Disp: , Rfl:  .  WELCHOL 625 MG tablet, Take 3 tablets by mouth 2 (two) times daily., Disp: , Rfl: 1 .  ZETIA 10 MG tablet, , Disp: , Rfl:    Exam: Current vital signs:  There were no vitals taken for this visit. Vital signs in last 24 hours: BP: ()/()  Arterial Line BP: ()/()   Physical Exam  Constitutional: Appears well-developed and well-nourished.  Psych: Affect appropriate to situation Eyes: No scleral injection HENT: No OP obstrucion Head: Normocephalic.  Cardiovascular: Normal rate and regular rhythm.  Respiratory: Effort normal, non-labored breathing GI: Soft.  No distension. There is no tenderness.  Skin: WDI  Neuro: Mental Status: Patient is awake, alert, oriented to person, place, month, year, and situation. Patient is able to give a clear and coherent history. No signs of aphasia but does have dysarthria Cranial Nerves: II: Visual Fields are full. Pupils are  equal, round, and reactive to light.   III,IV, VI: EOMI without ptosis or diploplia.  V: Facial sensation is symmetric to temperature VII: Facial movement is symmetric.  VIII: hearing is intact to voice X: Uvula elevates symmetrically XI: Shoulder shrug is symmetric. XII: tongue is midline without atrophy or fasciculations.  Motor: Tone is normal. Bulk is normal. 5/5 strength was present in all four extremities.  Sensory: 3 sensation on the left.   Deep Tendon Reflexes: 2+ and symmetric in the biceps and patellae.  Plantars: Toes are downgoing bilaterally.  Cerebellar: FNF and HKS are intact bilaterally  Labs I have reviewed labs in epic and the results pertinent to this consultation are:  CBC    Component Value Date/Time   HGB 12.2 01/07/2013 0810    CMP     Component Value Date/Time   NA 140 01/06/2013 1200   K 3.8 01/06/2013 1200   CL 100 01/06/2013 1200   CO2 31 01/06/2013 1200   GLUCOSE 84 01/06/2013 1200   BUN 12 03/29/2014 1428   CREATININE 1.01 03/29/2014 1428   CALCIUM 10.3 01/06/2013 1200   GFRNONAA 57 (L) 01/06/2013 1200   GFRAA 66 (L) 01/06/2013 1200    Lipid Panel  No results found for: CHOL, TRIG, HDL, CHOLHDL, VLDL, LDLCALC, LDLDIRECT   Imaging I have reviewed the images obtained:  CT-scan of the brain-CT brain did not show any intracranial abnormalities, mass, bleed, stroke  CTA of head and neck- pending  MRI examination of the brain-pending  Etta Quill PA-C [facility] [phone]  M-F  (9:00 am- 5:00 PM)  09/29/2018, 11:42 AM     Assessment:   62 year old female presented to the hospital with sudden onset of blurred vision last night that did not resolve by this morning.  Patient did have a right sided headache today and was noted to have left-sided facial droop, decreased sensation on the left, dysarthria.  For this reason code stroke was called.  At this time it is unclear if this is a complicated migraine or  possible stroke.  Patient did not have symptoms large enough to receive TPA.  Impression: - Possible complicated migraine -Possible small vessel stroke  Recommendations: - Obtain MRI brain.  If MRI is positive continue with stroke work-up.

## 2018-09-29 NOTE — Progress Notes (Signed)
Pharmacist Code Stroke Response  Notified to mix tPA at 1207 by Dr. Lorraine Lax Delivered tPA to RN at 1213  tPA dose = 7.8mg  bolus over 1 minute followed by 70.4mg  for a total dose of 78.2mg  over 1 hour  Issues/delays encountered (if applicable): Initially told that patient was not a tPA candidate due to mild nature of symptoms, however was later called back creating a slight delay in tPA preparation  Breanna Perry, [name] 09/29/18 12:15 PM

## 2018-09-29 NOTE — Progress Notes (Signed)
Chaplain responded to spiritual care consult about AD. Spoke with nurse before entering room, who said pt was really expressing interest about DNR.  Clarified with nurse that DNR and AD address different issues. When chaplain entered room, patient was in bed, in deep conversation with friend.  Chaplain  offered presence at future time if patient wishes to have conversation about conveying health goals and wishes.  Tamsen Snider Pager [phone]

## 2018-09-29 NOTE — Progress Notes (Signed)
  Echocardiogram 2D Echocardiogram has been performed.  Breanna Perry G [name] 09/29/2018, 3:01 PM

## 2018-09-29 NOTE — H&P (Addendum)
Neurology history and physical    CC: Dysarthria, left facial droop, decreased sensation on the left  History is obtained from: EMS and patient  HPI: Breanna Perry is a 62 y.o. female with history of tinnitus, hypertension, hyperlipidemia and Sjogren's disease.  Patient was at work when she suddenly felt headache at approximately 10:30 in the morning and then after this her coworkers noticed that she had a left-sided facial droop.  She does note that she did have a headache last night some blurred vision which was still present in the morning.  She denies having headaches or migraines in the past.  Denies taking aspirin.  On arriving at the hospital patient continued to have a left facial droop, significant dysarthria and decreased sensation to the left arm.  CT was obtained immediately and was negative.  Thus CTA of head and neck was obtained and showed likely M2 branch on the right occlusion.  Chart review: patient has been seen by neurology in the past for tremor and also tinnitus   LKW: 10 AM on 09/29/2018 tpa given?: no,  yes secondary to M2 occlusion Premorbid modified Rankin scale (mRS): 0 NIH stroke scale of 4 ROS: A 14 point ROS was performed and is negative except as noted in the HPI.       Past Medical History:  Diagnosis Date  . Anxiety   . Dental bridge present    upper and lower  . Depression   . Hypertension    under control with med., has been on med. x 8 yr.  . Medial meniscus tear 12/2012   left  . Sjogren's syndrome ([place])          Family History  Problem Relation Age of Onset  . Hypertension Father   . Emphysema Father   . Heart attack Father   . Cirrhosis Father   . Coronary artery disease Mother   . Heart failure Mother   . COPD Mother   . Breast cancer Maternal Grandmother   . HIV Brother   . Diabetes Sister     Social History:   reports that she quit smoking about 17 years ago. Her smoking use included cigarettes.  She has a 31.00 pack-year smoking history. She has never used smokeless tobacco. She reports current alcohol use. She reports that she does not use drugs.  Medications No current facility-administered medications for this encounter.   Current Outpatient Medications:  .  cetirizine (ZYRTEC) 10 MG tablet, Take 10 mg by mouth daily., Disp: , Rfl:  .  citalopram (CELEXA) 20 MG tablet, Take 20 mg by mouth daily., Disp: , Rfl: 1 .  hydrOXYzine (VISTARIL) 25 MG capsule, Take 25 mg by mouth as needed., Disp: , Rfl: 2 .  meloxicam (MOBIC) 15 MG tablet, Take 15 mg by mouth daily as needed. with food, Disp: , Rfl: 1 .  metoprolol (TOPROL-XL) 50 MG 24 hr tablet, Take 50 mg by mouth 2 (two) times daily. , Disp: , Rfl:  .  Olmesartan-Amlodipine-HCTZ (TRIBENZOR) 40-10-25 MG TABS, Take by mouth daily. , Disp: , Rfl:  .  phentermine 37.5 MG capsule, Take 37.5 mg by mouth as needed., Disp: , Rfl:  .  traMADol (ULTRAM) 50 MG tablet, Take by mouth every 6 (six) hours as needed., Disp: , Rfl:  .  WELCHOL 625 MG tablet, Take 3 tablets by mouth 2 (two) times daily., Disp: , Rfl: 1 .  ZETIA 10 MG tablet, , Disp: , Rfl:    Exam: Current vital  signs: There were no vitals taken for this visit. Vital signs in last 24 hours: BP: ()/()  Arterial Line BP: ()/()   Physical Exam  Constitutional: Appears well-developed and well-nourished.  Psych: Affect appropriate to situation Eyes: No scleral injection HENT: No OP obstrucion Head: Normocephalic.  Cardiovascular: Normal rate and regular rhythm.  Respiratory: Effort normal, non-labored breathing GI: Soft.  No distension. There is no tenderness.  Skin: WDI  Neuro: Mental Status: Patient is awake, alert, oriented to person, place, month, year, and situation. Patient is able to give a clear and coherent history. No signs of aphasia but does have dysarthria Cranial Nerves: II: Visual Fields are full. Pupils are equal, round, and reactive to light.    III,IV, VI: EOMI without ptosis or diploplia.  V: Facial sensation is symmetric to temperature VII: Facial movement is symmetric.  VIII: hearing is intact to voice X: Uvula elevates symmetrically XI: Shoulder shrug is symmetric. XII: tongue is midline without atrophy or fasciculations.  Motor: Tone is normal. Bulk is normal. 5/5 strength was present in all four extremities.  Sensory: 3 sensation on the left.   Deep Tendon Reflexes: 2+ and symmetric in the biceps and patellae.  Plantars: Toes are downgoing bilaterally.  Cerebellar: FNF and HKS are intact bilaterally  Labs I have reviewed labs in epic and the results pertinent to this consultation are:  CBC Labs(Brief)          Component Value Date/Time   HGB 12.2 01/07/2013 0810      CMP     Labs(Brief)          Component Value Date/Time   NA 140 01/06/2013 1200   K 3.8 01/06/2013 1200   CL 100 01/06/2013 1200   CO2 31 01/06/2013 1200   GLUCOSE 84 01/06/2013 1200   BUN 12 03/29/2014 1428   CREATININE 1.01 03/29/2014 1428   CALCIUM 10.3 01/06/2013 1200   GFRNONAA 57 (L) 01/06/2013 1200   GFRAA 66 (L) 01/06/2013 1200      Lipid Panel  Labs(Brief)  No results found for: CHOL, TRIG, HDL, CHOLHDL, VLDL, LDLCALC, LDLDIRECT     Imaging I have reviewed the images obtained:  CT-scan of the brain-CT brain did not show any intracranial abnormalities, mass, bleed, stroke  CTA of head and neck- pending  MRI examination of the brain-pending  Etta Quill PA-C [facility] [phone]  M-F  (9:00 am- 5:00 PM)  09/29/2018, 11:42 AM   Assessment: 62 year old female presented to the hospital with sudden onset of blurred vision last night that did not resolve by this morning.  Patient did have a right sided headache today and was noted to have left-sided facial droop, decreased sensation on the left, dysarthria.    CTA of head did show a right M2 occlusion.  Patient was  advised about TPA and family and patient agreed to receiving TPA.  Acute ischemic stroke-right M2 distal occlusion status post TPA  -Admit to ICU -No aspirin until 24hrs s/p tPA - repeat CT head in 24 hrs. -Frequent neuro checks - MR Brain w/o contrast - AIC, lipid profile    RESP Evaluate for acute Respiratory Failure if so-vent management per ICU -wean when able  CV -Aggressive BP control, goal SBP <180 secondary to significant cerebellar infarct -Titrate oral agents  Hyperlipidemia, unspecified  - Statin for goal LDL < 70  ENDO -SSI -goal HgbA1c < 7 t   Nutrition E66.9 Obesity  E46 Protein-Calorie Malnutrition Mild Moderate Severe -diet consult  Prophylaxis  DVT: SCD Bowel: Senokot  Diet: NPO until cleared by speech    NEUROHOSPITALIST ADDENDUM Performed a face to face diagnostic evaluation.   I have reviewed the contents of history and physical exam as documented by PA/ARNP/Resident and agree with above documentation.  I have discussed and formulated the above plan as documented. Edits to the note have been made as needed.  Impression: Acute right MCA stroke status post TPA Key exam findings: Patient presented with left facial droop slurred speech not sensory deficits Plan: Admit to ICU.  Close monitoring.  Will need work-up to identify etiology for stroke.       This patient is neurologically critically ill due to right MCA stroke status post TPA. She is at risk for significant risk of neurological worsening from cerebral edema,  death from brain herniation, heart failure, hemorrhagic conversion, infection, respiratory failure and seizure. This patient's care requires constant monitoring of vital signs, hemodynamics, respiratory and cardiac monitoring, review of multiple databases, neurological assessment, discussion with family, other specialists and medical decision making of high complexity.  I spent 60  minutes of neurocritical time in the care of  this patient.    [name] Aroor MD [facility] [phone]   If 7pm to 7am, please call on call as listed on AMION.

## 2018-09-29 NOTE — ED Triage Notes (Signed)
Pt brought in by [facility] from work. Code stroke was called in the field. Pt presented with right sided facial droop, right sided headache, and blurry vision. Stroke team and neurologist present on arrival .

## 2018-09-29 NOTE — Progress Notes (Signed)
PT Cancellation Note  Patient Details Name: Breanna Perry MRN: [id number] DOB: 06/22/1956   Cancelled Treatment:    Reason Eval/Treat Not Completed: Active bedrest order.  PT will check back tomorrow. Thanks,  [name]. Jasn Xia, PT, DPT  Acute Rehabilitation [phone] pager [phone]) [phone] office     Wells Guiles B Kingslee Dowse 09/29/2018, 2:10 PM

## 2018-09-29 NOTE — ED Provider Notes (Signed)
[facility] EMERGENCY DEPARTMENT Provider Note   CSN: [id number] Arrival date & time: 09/29/18  1136   An emergency department physician performed an initial assessment on this suspected stroke patient at 1136.  History   Chief Complaint No chief complaint on file.   HPI Breanna Perry is a 62 y.o. female with history of hypertension, hyperlipidemia, Sjogren's syndrome, depression and anxiety presents for evaluation of acute onset, persistent dysarthria and left-sided facial droop beginning at around 10:30 AM today.  She notes that she was walking to her car when she felt sudden onset severe right-sided headache and felt confused.  She states that she did not know how to unlock her car.  She went back inside to her workplace where her coworkers noted a left-sided facial droop and some dysarthric speech so they called EMS.  She took ibuprofen for her headache with some improvement.  She did note last night that she experienced some blurry vision bilaterally but this resolved this morning when she awoke.  She does not have a history of migraine headaches.  Denies fever, nausea, vomiting, chest pain, or shortness of breath.  No aggravating or alleviating factors noted.  The history is provided by the patient and medical records.    Past Medical History:  Diagnosis Date  . Anxiety   . Dental bridge present    upper and lower  . Depression   . Hypertension    under control with med., has been on med. x 8 yr.  . Medial meniscus tear 12/2012   left  . Sjogren's syndrome [facility])     Patient Active Problem List   Diagnosis Date Noted  . Acute right arterial ischemic stroke, middle cerebral artery (MCA) ([place]) 09/29/2018  . Alveolar pneumopathy ([place]) 04/12/2011    Past Surgical History:  Procedure Laterality Date  . APPENDECTOMY  04/1991  . KNEE ARTHROSCOPY Left 01/07/2013   Procedure: LEFT KNEE ARTHROSCOPY PARTIAL MEDIAL MENISECTOMY CHRONDOPLASTY MEDIAL PLICA  RESECTION;  Surgeon: Alta Corning, MD;  Location: [place];  Service: Orthopedics;  Laterality: Left;  . KNEE SURGERY  06/22/2015  . LIVER BIOPSY  summer 2011  . LUNG BIOPSY  summer 2011  . PARTIAL HYSTERECTOMY  04/1991   partial     OB History   No obstetric history on file.      Home Medications    Prior to Admission medications   Medication Sig Start Date End Date Taking? Authorizing Provider  ARIPiprazole (ABILIFY) 2 MG tablet Take 2 mg by mouth daily. 04/02/18  Yes [provider]  cetirizine (ZYRTEC) 10 MG tablet Take 10 mg by mouth daily.   Yes [provider]  citalopram (CELEXA) 40 MG tablet Take 40 mg by mouth daily.  04/09/17  Yes [provider]  hydrOXYzine (VISTARIL) 25 MG capsule Take 25 mg by mouth as needed for itching.  04/02/17  Yes [provider]  meloxicam (MOBIC) 15 MG tablet Take 15 mg by mouth daily as needed for pain (take with food).  05/01/17  Yes [provider]  metoprolol (TOPROL-XL) 50 MG 24 hr tablet Take 50 mg by mouth 2 (two) times daily.    Yes [provider]  Olmesartan-Amlodipine-HCTZ (TRIBENZOR) 40-10-25 MG TABS Take 1 tablet by mouth daily.    Yes [provider]  phentermine 37.5 MG capsule Take 37.5 mg by mouth as needed.   Yes [provider]  ZETIA 10 MG tablet Take 10 mg by mouth daily.  02/24/14  Yes [provider]    Family History Family History  Problem Relation Age of Onset  . Hypertension Father   . Emphysema Father   . Heart attack Father   . Cirrhosis Father   . Coronary artery disease Mother   . Heart failure Mother   . COPD Mother   . Breast cancer Maternal Grandmother   . HIV Brother   . Diabetes Sister     Social History Social History   Tobacco Use  . Smoking status: Former Smoker    Packs/day: 1.00    Years: 31.00    Pack years: 31.00    Types: Cigarettes    Last attempt to quit: 10/08/2000    Years since quitting:  17.9  . Smokeless tobacco: Never Used  Substance Use Topics  . Alcohol use: Yes    Comment: rarely  . Drug use: No     Allergies   Patient has no known allergies.   Review of Systems Review of Systems  Constitutional: Negative for chills and fever.  Eyes: Positive for visual disturbance (resolved).  Respiratory: Negative for shortness of breath.   Cardiovascular: Negative for chest pain.  Gastrointestinal: Negative for abdominal pain, nausea and vomiting.  Neurological: Positive for facial asymmetry, speech difficulty, weakness and headaches. Negative for syncope.  All other systems reviewed and are negative.    Physical Exam Updated Vital Signs BP 111/79   Pulse 71   Temp 98.5 F (36.9 C)   Resp (!) 25   Ht 5\' 4"  (1.626 m)   Wt 86.9 kg   SpO2 100%   BMI 32.88 kg/m   Physical Exam Vitals signs and nursing note reviewed.  Constitutional:      General: She is not in acute distress.    Appearance: She is well-developed.  HENT:     Head: Normocephalic and atraumatic.  Eyes:     General:        Right eye: No discharge.        Left eye: No discharge.     Conjunctiva/sclera: Conjunctivae normal.     Pupils: Pupils are equal, round, and reactive to light.  Neck:     Vascular: No JVD.     Trachea: No tracheal deviation.  Cardiovascular:     Rate and Rhythm: Normal rate.     Pulses: Normal pulses.  Pulmonary:     Effort: Pulmonary effort is normal.     Breath sounds: Normal breath sounds.  Abdominal:     General: Bowel sounds are normal. There is no distension.     Tenderness: There is no abdominal tenderness. There is no guarding.  Musculoskeletal:        General: No tenderness.  Skin:    General: Skin is warm and dry.     Capillary Refill: Capillary refill takes less than 2 seconds.     Findings: No erythema.  Neurological:     Mental Status: She is alert and oriented to person, place, and time.     GCS: GCS eye subscore is 4. GCS verbal subscore is 5.  GCS motor subscore is 6.     Cranial Nerves: Cranial nerve deficit present.     Sensory: Sensory deficit present.     Motor: No weakness.     Comments: Somewhat dysarthric speech.  Mild left-sided facial droop noted.  Decreased sensation to soft touch of the left side of the face and left upper and lower extremities.  5/5 strength of BUE and  BLE major muscle groups.  No pronator drift.  Psychiatric:        Behavior: Behavior normal.      ED Treatments / Results  Labs (all labs ordered are listed, but only abnormal results are displayed) Labs Reviewed  CBC - Abnormal; Notable for the following components:      Result Value   WBC 3.5 (*)    All other components within normal limits  COMPREHENSIVE METABOLIC PANEL - Abnormal; Notable for the following components:   Potassium 3.3 (*)    Creatinine, Ser 1.07 (*)    GFR calc non Af Amer 56 (*)    All other components within normal limits  I-STAT CHEM 8, ED - Abnormal; Notable for the following components:   Potassium 3.3 (*)    Creatinine, Ser 1.10 (*)    All other components within normal limits  ETHANOL  PROTIME-INR  APTT  DIFFERENTIAL  RAPID URINE DRUG SCREEN, HOSP PERFORMED  URINALYSIS, ROUTINE W REFLEX MICROSCOPIC  HIV ANTIBODY (ROUTINE TESTING W REFLEX)  I-STAT TROPONIN, ED  CBG MONITORING, ED    EKG ED ECG REPORT   Date: 09/29/2018  Rate: 69  Rhythm: normal sinus rhythm  QRS Axis: normal  Intervals: normal  ST/T Wave abnormalities: nonspecific T wave changes  Conduction Disutrbances:none  Narrative Interpretation:   Old EKG Reviewed: unchanged  I have personally reviewed the EKG tracing and agree with the computerized printout as noted.   Radiology Ct Angio Head W Or Wo Contrast  Result Date: 09/29/2018 CLINICAL DATA:  Altered level of consciousness.  Stroke. EXAM: CT ANGIOGRAPHY HEAD AND NECK TECHNIQUE: Multidetector CT imaging of the head and neck was performed using the standard protocol during bolus  administration of intravenous contrast. Multiplanar CT image reconstructions and MIPs were obtained to evaluate the vascular anatomy. Carotid stenosis measurements (when applicable) are obtained utilizing NASCET criteria, using the distal internal carotid diameter as the denominator. CONTRAST:  55mL ISOVUE-370 IOPAMIDOL (ISOVUE-370) INJECTION 76% COMPARISON:  CT head 09/29/2018 FINDINGS: CTA NECK FINDINGS Aortic arch: Normal aortic arch.  Normal proximal great vessels. Right carotid system: No significant right carotid stenosis. Bifurcation widely patent. Left carotid system: Left carotid widely patent with mild atherosclerotic disease left bifurcation Vertebral arteries: Both vertebral arteries widely patent without stenosis. Skeleton: Cervical spine degenerative change.  No acute abnormality. Other neck: Negative for mass or adenopathy. Upper chest: Negative Review of the MIP images confirms the above findings CTA HEAD FINDINGS Anterior circulation: Cavernous carotid widely patent bilaterally. Anterior cerebral arteries widely patent bilaterally. Left middle cerebral artery widely patent Filling defect in the distal right M1 segment at the bifurcation. This is causing subtotal occlusion with possible distal branch occlusion. Probable embolus. Posterior circulation: Both vertebral arteries patent to the basilar. PICA patent bilaterally. Basilar is hypoplastic with fetal origin of the posterior cerebral artery bilaterally. PCA patent bilaterally. Venous sinuses: Patent Anatomic variants: None Delayed phase: Not perform Review of the MIP images confirms the above findings IMPRESSION: Focal stenosis right MCA bifurcation compatible with embolus. Possible branch occlusion right M2. No significant carotid or vertebral stenosis in the neck. These results were called by telephone at the time of interpretation on 09/29/2018 at 12:05 pm to Dr. Lorraine Lax , who verbally acknowledged these results. Electronically Signed   By:  Franchot Gallo M.D.   On: 09/29/2018 12:15   Ct Angio Neck W Or Wo Contrast  Result Date: 09/29/2018 CLINICAL DATA:  Altered level of consciousness.  Stroke. EXAM: CT ANGIOGRAPHY HEAD AND NECK  TECHNIQUE: Multidetector CT imaging of the head and neck was performed using the standard protocol during bolus administration of intravenous contrast. Multiplanar CT image reconstructions and MIPs were obtained to evaluate the vascular anatomy. Carotid stenosis measurements (when applicable) are obtained utilizing NASCET criteria, using the distal internal carotid diameter as the denominator. CONTRAST:  13mL ISOVUE-370 IOPAMIDOL (ISOVUE-370) INJECTION 76% COMPARISON:  CT head 09/29/2018 FINDINGS: CTA NECK FINDINGS Aortic arch: Normal aortic arch.  Normal proximal great vessels. Right carotid system: No significant right carotid stenosis. Bifurcation widely patent. Left carotid system: Left carotid widely patent with mild atherosclerotic disease left bifurcation Vertebral arteries: Both vertebral arteries widely patent without stenosis. Skeleton: Cervical spine degenerative change.  No acute abnormality. Other neck: Negative for mass or adenopathy. Upper chest: Negative Review of the MIP images confirms the above findings CTA HEAD FINDINGS Anterior circulation: Cavernous carotid widely patent bilaterally. Anterior cerebral arteries widely patent bilaterally. Left middle cerebral artery widely patent Filling defect in the distal right M1 segment at the bifurcation. This is causing subtotal occlusion with possible distal branch occlusion. Probable embolus. Posterior circulation: Both vertebral arteries patent to the basilar. PICA patent bilaterally. Basilar is hypoplastic with fetal origin of the posterior cerebral artery bilaterally. PCA patent bilaterally. Venous sinuses: Patent Anatomic variants: None Delayed phase: Not perform Review of the MIP images confirms the above findings IMPRESSION: Focal stenosis right MCA  bifurcation compatible with embolus. Possible branch occlusion right M2. No significant carotid or vertebral stenosis in the neck. These results were called by telephone at the time of interpretation on 09/29/2018 at 12:05 pm to Dr. Lorraine Lax , who verbally acknowledged these results. Electronically Signed   By: Franchot Gallo M.D.   On: 09/29/2018 12:15   Ct Head Code Stroke Wo Contrast  Result Date: 09/29/2018 CLINICAL DATA:  Code stroke.  Altered level of consciousness EXAM: CT HEAD WITHOUT CONTRAST TECHNIQUE: Contiguous axial images were obtained from the base of the skull through the vertex without intravenous contrast. COMPARISON:  MRI 04/19/2014 FINDINGS: Brain: No evidence of acute infarction, hemorrhage, hydrocephalus, extra-axial collection or mass lesion/mass effect. Vascular: Negative for hyperdense vessel Skull: Negative Sinuses/Orbits: Negative Other: None ASPECTS ([place] Stroke Program Early CT Score) - Ganglionic level infarction (caudate, lentiform nuclei, internal capsule, insula, M1-M3 cortex): 7 - Supraganglionic infarction (M4-M6 cortex): 3 Total score (0-10 with 10 being normal): 10 IMPRESSION: 1. Negative CT head 2. ASPECTS is 10 3. These results were called by telephone at the time of interpretation on 09/29/2018 at 12:05 pm to Dr. Lorraine Lax , who verbally acknowledged these results. Electronically Signed   By: Franchot Gallo M.D.   On: 09/29/2018 12:03    Procedures .Critical Care Performed by: Renita Papa, PA-C Authorized by: Renita Papa, PA-C   Critical care provider statement:    Critical care time (minutes):  40   Critical care was necessary to treat or prevent imminent or life-threatening deterioration of the following conditions: stroke requiring tpa.   Critical care was time spent personally by me on the following activities:  Discussions with consultants, evaluation of patient's response to treatment, examination of patient, ordering and performing treatments and  interventions, ordering and review of laboratory studies, ordering and review of radiographic studies, pulse oximetry, re-evaluation of patient's condition, obtaining history from patient or surrogate, review of old charts and development of treatment plan with patient or surrogate   I assumed direction of critical care for this patient from another provider in my specialty: no     (  including critical care time)  Medications Ordered in ED Medications  alteplase (ACTIVASE) 1 mg/mL infusion 78.2 mg (0 mg Intravenous Paused 09/29/18 1305)    Followed by  0.9 %  sodium chloride infusion (has no administration in time range)   stroke: mapping our early stages of recovery book (has no administration in time range)  0.9 %  sodium chloride infusion (has no administration in time range)  acetaminophen (TYLENOL) tablet 650 mg (has no administration in time range)    Or  acetaminophen (TYLENOL) solution 650 mg (has no administration in time range)    Or  acetaminophen (TYLENOL) suppository 650 mg (has no administration in time range)  pantoprazole (PROTONIX) injection 40 mg (has no administration in time range)  iopamidol (ISOVUE-370) 76 % injection 50 mL (50 mLs Intravenous Contrast Given 09/29/18 1203)     Initial Impression / Assessment and Plan / ED Course  I have reviewed the triage vital signs and the nursing notes.  Pertinent labs & imaging results that were available during my care of the patient were reviewed by me and considered in my medical decision making (see chart for details).    Patient presenting via EMS for acute onset right-sided headache, left-sided facial droop, and sensory changes since 7:30 AM this morning.  She is afebrile, vital signs are stable.  She is nontoxic in appearance.  She does have focal deficits on examination.  Code stroke was called and patient was evaluated by neurology prior to my assessment.  CT head was obtained which was negative but given patient's  findings on examination, a CTA of the head and neck was obtained which showed right MCA stenosis with possible branch occlusion of the right M2.  TPA to be given.  Lab work shows mild leukopenia and mildly elevated creatinine the BUN is within normal limits.  No significant metabolic derangements.  Troponin is negative.  EKG shows normal sinus rhythm with nonspecific T wave abnormalities.  Plan to admit for stroke work-up.  Critical care services were provided during the encounter today. Final Clinical Impressions(s) / ED Diagnoses   Final diagnoses:  Acute right MCA stroke [facility])    ED Discharge Orders    None       Debroah Baller 09/29/18 1327    Duffy Bruce, MD 09/30/18 1119

## 2018-09-30 ENCOUNTER — Inpatient Hospital Stay (HOSPITAL_COMMUNITY): Payer: BLUE CROSS/BLUE SHIELD

## 2018-09-30 DIAGNOSIS — M3500 Sicca syndrome, unspecified: Secondary | ICD-10-CM | POA: Diagnosis present

## 2018-09-30 DIAGNOSIS — E669 Obesity, unspecified: Secondary | ICD-10-CM | POA: Diagnosis present

## 2018-09-30 DIAGNOSIS — E785 Hyperlipidemia, unspecified: Secondary | ICD-10-CM | POA: Diagnosis present

## 2018-09-30 DIAGNOSIS — I10 Essential (primary) hypertension: Secondary | ICD-10-CM | POA: Diagnosis present

## 2018-09-30 LAB — LIPID PANEL
Cholesterol: 172 mg/dL (ref 0–200)
HDL: 59 mg/dL (ref >40)
LDL Cholesterol: 99 mg/dL (ref 0–99)
Total CHOL/HDL Ratio: 2.9 RATIO
Triglycerides: 69 mg/dL (ref <150)
VLDL: 14 mg/dL (ref 0–40)

## 2018-09-30 LAB — HEMOGLOBIN A1C
Hgb A1c MFr Bld: 5.8 % — ABNORMAL HIGH (ref 4.8–5.6)
Mean Plasma Glucose: 119.76 mg/dL

## 2018-09-30 LAB — HIV ANTIBODY (ROUTINE TESTING W REFLEX): HIV SCREEN 4TH GENERATION: NONREACTIVE

## 2018-09-30 MED ORDER — ASPIRIN 81 MG PO TBEC: 81.0000 mg | DELAYED_RELEASE_TABLET | Freq: Every day | ORAL | Status: AC

## 2018-09-30 MED ORDER — ASPIRIN EC 81 MG PO TBEC
81.0000 mg | DELAYED_RELEASE_TABLET | Freq: Every day | ORAL | Status: DC
  Administered 2018-09-30: 81 mg via ORAL
  Filled 2018-09-30: qty 1

## 2018-09-30 MED ORDER — ATORVASTATIN CALCIUM 40 MG PO TABS
40.0000 mg | ORAL_TABLET | Freq: Every day | ORAL | Status: DC
  Administered 2018-09-30: 40 mg via ORAL
  Filled 2018-09-30: qty 1

## 2018-09-30 MED ORDER — ATORVASTATIN CALCIUM 40 MG PO TABS: 40.0000 mg | ORAL_TABLET | Freq: Every day | ORAL | 2 refills | Status: AC

## 2018-09-30 NOTE — Progress Notes (Signed)
OT Cancellation Note  Patient Details Name: Ermalinda Joubert MRN: [id number] DOB: 03/24/1956   Cancelled Treatment:    Reason Eval/Treat Not Completed: Active bedrest order  Connell, OT/L   Acute OT Clinical Specialist Acute Rehabilitation Services Pager [phone] Office [phone]  09/30/2018, 7:46 AM

## 2018-09-30 NOTE — Discharge Summary (Addendum)
Stroke Discharge Summary  Patient ID: Breanna Perry   MRN: [id number]      DOB: 08-26-1956  Date of Admission: 09/29/2018 Date of Discharge: 09/30/2018  Attending Physician:  Aroor, Lanice Schwab, MD, Stroke MD Consultant(s):    None  Patient's PCP:  [name], MD  DISCHARGE DIAGNOSIS:  Principal Problem:   Acute right arterial ischemic stroke, middle cerebral artery (MCA) ([place]) s/p tPA Active Problems:   HTN (hypertension)   Hyperlipidemia LDL goal <70   Sjogren's syndrome ([place])   Obesity   Past Medical History:  Diagnosis Date  . Anxiety   . Dental bridge present    upper and lower  . Depression   . Hypertension    under control with med., has been on med. x 8 yr.  . Medial meniscus tear 12/2012   left  . Sjogren's syndrome [facility])    Past Surgical History:  Procedure Laterality Date  . APPENDECTOMY  04/1991  . KNEE ARTHROSCOPY Left 01/07/2013   Procedure: LEFT KNEE ARTHROSCOPY PARTIAL MEDIAL MENISECTOMY CHRONDOPLASTY MEDIAL PLICA RESECTION;  Surgeon: Alta Corning, MD;  Location: [place];  Service: Orthopedics;  Laterality: Left;  . KNEE SURGERY  06/22/2015  . LIVER BIOPSY  summer 2011  . LUNG BIOPSY  summer 2011  . PARTIAL HYSTERECTOMY  04/1991   partial    Allergies as of 09/30/2018   No Known Allergies     Medication List    STOP taking these medications   meloxicam 15 MG tablet Commonly known as:  MOBIC   ZETIA 10 MG tablet Generic drug:  ezetimibe     TAKE these medications   ARIPiprazole 2 MG tablet Commonly known as:  ABILIFY Take 2 mg by mouth daily.   aspirin 81 MG EC tablet Take 1 tablet (81 mg total) by mouth daily. Start taking on:  October 01, 2018   atorvastatin 40 MG tablet Commonly known as:  LIPITOR Take 1 tablet (40 mg total) by mouth daily at 6 PM.   cetirizine 10 MG tablet Commonly known as:  ZYRTEC Take 10 mg by mouth daily.   citalopram 40 MG tablet Commonly known as:  CELEXA Take 40 mg by  mouth daily.   hydrOXYzine 25 MG capsule Commonly known as:  VISTARIL Take 25 mg by mouth as needed for itching.   metoprolol succinate 50 MG 24 hr tablet Commonly known as:  TOPROL-XL Take 50 mg by mouth 2 (two) times daily.   phentermine 37.5 MG capsule Take 37.5 mg by mouth as needed.   TRIBENZOR 40-10-25 MG Tabs Generic drug:  Olmesartan-amLODIPine-HCTZ Take 1 tablet by mouth daily.       LABORATORY STUDIES CBC    Component Value Date/Time   WBC 3.5 (L) 09/29/2018 1140   RBC 3.92 09/29/2018 1140   HGB 12.9 09/29/2018 1144   HCT 38.0 09/29/2018 1144   PLT 337 09/29/2018 1140   MCV 94.1 09/29/2018 1140   MCH 31.1 09/29/2018 1140   MCHC 33.1 09/29/2018 1140   RDW 14.7 09/29/2018 1140   LYMPHSABS 1.2 09/29/2018 1140   MONOABS 0.3 09/29/2018 1140   EOSABS 0.0 09/29/2018 1140   BASOSABS 0.0 09/29/2018 1140   CMP    Component Value Date/Time   NA 139 09/29/2018 1144   K 3.3 (L) 09/29/2018 1144   CL 104 09/29/2018 1144   CO2 27 09/29/2018 1140   GLUCOSE 88 09/29/2018 1144   BUN 11 09/29/2018 1144   CREATININE 1.10 (  H) 09/29/2018 1144   CREATININE 1.01 03/29/2014 1428   CALCIUM 9.6 09/29/2018 1140   PROT 7.3 09/29/2018 1140   ALBUMIN 4.0 09/29/2018 1140   AST 19 09/29/2018 1140   ALT 23 09/29/2018 1140   ALKPHOS 49 09/29/2018 1140   BILITOT 1.0 09/29/2018 1140   GFRNONAA 56 (L) 09/29/2018 1140   GFRAA >60 09/29/2018 1140   COAGS Lab Results  Component Value Date   INR 0.96 09/29/2018   Lipid Panel    Component Value Date/Time   CHOL 172 09/30/2018 0247   TRIG 69 09/30/2018 0247   HDL 59 09/30/2018 0247   CHOLHDL 2.9 09/30/2018 0247   VLDL 14 09/30/2018 0247   LDLCALC 99 09/30/2018 0247   HgbA1C  Lab Results  Component Value Date   HGBA1C 5.8 (H) 09/30/2018   Urinalysis    Component Value Date/Time   COLORURINE STRAW (A) 09/29/2018 1909   APPEARANCEUR CLEAR 09/29/2018 1909   LABSPEC 1.019 09/29/2018 1909   PHURINE 6.0 09/29/2018 1909    GLUCOSEU NEGATIVE 09/29/2018 1909   HGBUR NEGATIVE 09/29/2018 1909   BILIRUBINUR NEGATIVE 09/29/2018 1909   KETONESUR NEGATIVE 09/29/2018 1909   PROTEINUR NEGATIVE 09/29/2018 1909   NITRITE NEGATIVE 09/29/2018 1909   LEUKOCYTESUR NEGATIVE 09/29/2018 1909   Urine Drug Screen     Component Value Date/Time   LABOPIA NONE DETECTED 09/29/2018 1909   COCAINSCRNUR NONE DETECTED 09/29/2018 1909   LABBENZ NONE DETECTED 09/29/2018 1909   AMPHETMU NONE DETECTED 09/29/2018 1909   THCU NONE DETECTED 09/29/2018 1909   LABBARB NONE DETECTED 09/29/2018 1909    Alcohol Level    Component Value Date/Time   ETH <10 09/29/2018 1140     SIGNIFICANT DIAGNOSTIC STUDIES Ct Angio Head W Or Wo Contrast  Result Date: 09/29/2018 CLINICAL DATA:  Altered level of consciousness.  Stroke. EXAM: CT ANGIOGRAPHY HEAD AND NECK TECHNIQUE: Multidetector CT imaging of the head and neck was performed using the standard protocol during bolus administration of intravenous contrast. Multiplanar CT image reconstructions and MIPs were obtained to evaluate the vascular anatomy. Carotid stenosis measurements (when applicable) are obtained utilizing NASCET criteria, using the distal internal carotid diameter as the denominator. CONTRAST:  72mL ISOVUE-370 IOPAMIDOL (ISOVUE-370) INJECTION 76% COMPARISON:  CT head 09/29/2018 FINDINGS: CTA NECK FINDINGS Aortic arch: Normal aortic arch.  Normal proximal great vessels. Right carotid system: No significant right carotid stenosis. Bifurcation widely patent. Left carotid system: Left carotid widely patent with mild atherosclerotic disease left bifurcation Vertebral arteries: Both vertebral arteries widely patent without stenosis. Skeleton: Cervical spine degenerative change.  No acute abnormality. Other neck: Negative for mass or adenopathy. Upper chest: Negative Review of the MIP images confirms the above findings CTA HEAD FINDINGS Anterior circulation: Cavernous carotid widely patent  bilaterally. Anterior cerebral arteries widely patent bilaterally. Left middle cerebral artery widely patent Filling defect in the distal right M1 segment at the bifurcation. This is causing subtotal occlusion with possible distal branch occlusion. Probable embolus. Posterior circulation: Both vertebral arteries patent to the basilar. PICA patent bilaterally. Basilar is hypoplastic with fetal origin of the posterior cerebral artery bilaterally. PCA patent bilaterally. Venous sinuses: Patent Anatomic variants: None Delayed phase: Not perform Review of the MIP images confirms the above findings IMPRESSION: Focal stenosis right MCA bifurcation compatible with embolus. Possible branch occlusion right M2. No significant carotid or vertebral stenosis in the neck. These results were called by telephone at the time of interpretation on 09/29/2018 at 12:05 pm to Dr. Lorraine Lax , who verbally acknowledged  these results. Electronically Signed   By: Franchot Gallo M.D.   On: 09/29/2018 12:15   Ct Head Wo Contrast  Result Date: 09/29/2018 CLINICAL DATA:  Code stroke follow-up. Frontal headache. Right MCA embolus and M2 occlusion. EXAM: CT HEAD WITHOUT CONTRAST TECHNIQUE: Contiguous axial images were obtained from the base of the skull through the vertex without intravenous contrast. COMPARISON:  Earlier same day FINDINGS: Brain: Continued normal appearance of the brain itself without evidence of identifiable acute infarction, mass lesion, hemorrhage, hydrocephalus or extra-axial collection. Aspects remains 10. Vascular: Question punctate hyperdensity of the vessel at the right MCA bifurcation, consistent with the location of the small embolus shown by CTA. Skull: Normal Sinuses/Orbits: Clear Other: Negative IMPRESSION: No change in appearance of the brain parenchyma. No evidence of developing infarction by CT. Question punctate hyperdensity at the right MCA bifurcation, which would be consistent with the small embolus shown by  CT angiography. Electronically Signed   By: Nelson Chimes M.D.   On: 09/29/2018 13:30   Ct Angio Neck W Or Wo Contrast  Result Date: 09/29/2018 CLINICAL DATA:  Altered level of consciousness.  Stroke. EXAM: CT ANGIOGRAPHY HEAD AND NECK TECHNIQUE: Multidetector CT imaging of the head and neck was performed using the standard protocol during bolus administration of intravenous contrast. Multiplanar CT image reconstructions and MIPs were obtained to evaluate the vascular anatomy. Carotid stenosis measurements (when applicable) are obtained utilizing NASCET criteria, using the distal internal carotid diameter as the denominator. CONTRAST:  23mL ISOVUE-370 IOPAMIDOL (ISOVUE-370) INJECTION 76% COMPARISON:  CT head 09/29/2018 FINDINGS: CTA NECK FINDINGS Aortic arch: Normal aortic arch.  Normal proximal great vessels. Right carotid system: No significant right carotid stenosis. Bifurcation widely patent. Left carotid system: Left carotid widely patent with mild atherosclerotic disease left bifurcation Vertebral arteries: Both vertebral arteries widely patent without stenosis. Skeleton: Cervical spine degenerative change.  No acute abnormality. Other neck: Negative for mass or adenopathy. Upper chest: Negative Review of the MIP images confirms the above findings CTA HEAD FINDINGS Anterior circulation: Cavernous carotid widely patent bilaterally. Anterior cerebral arteries widely patent bilaterally. Left middle cerebral artery widely patent Filling defect in the distal right M1 segment at the bifurcation. This is causing subtotal occlusion with possible distal branch occlusion. Probable embolus. Posterior circulation: Both vertebral arteries patent to the basilar. PICA patent bilaterally. Basilar is hypoplastic with fetal origin of the posterior cerebral artery bilaterally. PCA patent bilaterally. Venous sinuses: Patent Anatomic variants: None Delayed phase: Not perform Review of the MIP images confirms the above findings  IMPRESSION: Focal stenosis right MCA bifurcation compatible with embolus. Possible branch occlusion right M2. No significant carotid or vertebral stenosis in the neck. These results were called by telephone at the time of interpretation on 09/29/2018 at 12:05 pm to Dr. Lorraine Lax , who verbally acknowledged these results. Electronically Signed   By: Franchot Gallo M.D.   On: 09/29/2018 12:15   Mr Brain Wo Contrast  Result Date: 09/30/2018 CLINICAL DATA:  TPA administration. Headache beginning yesterday. Left-sided facial droop. EXAM: MRI HEAD WITHOUT CONTRAST TECHNIQUE: Multiplanar, multiecho pulse sequences of the brain and surrounding structures were obtained without intravenous contrast. COMPARISON:  Multiple CT studies done yesterday. FINDINGS: Brain: Diffusion imaging shows acute infarction within the deep insula, the right lateral temporal lobe and the temporoparietal junction. No large confluent infarction. No sign of mass lesion, hemorrhage, hydrocephalus or extra-axial collection. Minimal petechial blood products in the deep insula. No evidence of pre-existing infarction. Vascular: Major vessels at the base of the  brain show flow. Skull and upper cervical spine: Negative Sinuses/Orbits: Clear/normal Other: None IMPRESSION: Scattered areas of acute infarction in the right MCA territory as outlined above. The largest region affects the deep insula. Minimal petechial blood products in that location. No frank hematoma. Electronically Signed   By: Nelson Chimes M.D.   On: 09/30/2018 11:17   Ct Head Code Stroke Wo Contrast  Result Date: 09/29/2018 CLINICAL DATA:  Code stroke.  Altered level of consciousness EXAM: CT HEAD WITHOUT CONTRAST TECHNIQUE: Contiguous axial images were obtained from the base of the skull through the vertex without intravenous contrast. COMPARISON:  MRI 04/19/2014 FINDINGS: Brain: No evidence of acute infarction, hemorrhage, hydrocephalus, extra-axial collection or mass lesion/mass  effect. Vascular: Negative for hyperdense vessel Skull: Negative Sinuses/Orbits: Negative Other: None ASPECTS ([place] Stroke Program Early CT Score) - Ganglionic level infarction (caudate, lentiform nuclei, internal capsule, insula, M1-M3 cortex): 7 - Supraganglionic infarction (M4-M6 cortex): 3 Total score (0-10 with 10 being normal): 10 IMPRESSION: 1. Negative CT head 2. ASPECTS is 10 3. These results were called by telephone at the time of interpretation on 09/29/2018 at 12:05 pm to Dr. Lorraine Lax , who verbally acknowledged these results. Electronically Signed   By: Franchot Gallo M.D.   On: 09/29/2018 12:03    2D Echocardiogram  - Left ventricle: The cavity size was normal. Wall thickness wasnormal. Systolic function was normal. The estimated ejectionfraction was in the range of 55% to 60%. Wall motion was normal;there were no regional wall motion abnormalities. Leftventricular diastolic function parameters were normal. - Left atrium: The atrium was mildly dilated. - Atrial septum: No defect or patent foramen ovale was identified.  HISTORY OF PRESENT ILLNESS Breanna Luckettis a 62 y.o.femalewith history of tinnitus, hypertension, hyperlipidemia and Sjogren's disease. Patient was at work when she suddenly felt headache at approximately 10:30 in the morning and then after this her coworkers noticed that she had a left-sided facial droop. She does note that she did have a headache last night some blurred vision which was still present in the morning. She denies having headaches or migraines in the past. Denies taking aspirin. On arriving at the hospital patient continued to have a left facial droop, significant dysarthria and decreased sensation to the left arm. CT was obtained immediately and was negative. Thus CTA of head and neck was obtained and showed likely M2 branch on the right occlusion. She was LKW at 10 AM on 09/29/2018. Premorbid modified Rankin scale (mRS):0. NIH stroke scale of 4.  She was administered IV tPA and admitted to the neuro ICU for further evaluation and treatment.   Exam: NAD, pleasant                  Speech:    Speech is normal; fluent and spontaneous with normal comprehension.  Cognition:    The patient is oriented to person, place, and time;     recent and remote memory intact;     language fluent;    Cranial Nerves:    The pupils are equal, round, and reactive to light.Trigeminal sensation is intact and the muscles of mastication are normal. The face is symmetric. The palate elevates in the midline. Hearing intact. Voice is normal. Shoulder shrug is normal. The tongue has normal motion without fasciculations.   Coordination:  No dysmetria  Motor Observation:    No asymmetry, no atrophy, and no involuntary movements noted. Tone:    Normal muscle tone.     Strength:    Strength is  V/V in the upper and lower limbs.      Sensation: intact to LT    HOSPITAL COURSE Stroke:   Scattered right MCA infarcts - embolic - source unknown   Code Stroke CT head No acute stroke. ASPECTS 10.     CTA head & neck focal stenosis right MCA compatible with embolus.  Possible branch occlusion R M2.  Neck okay  Repeat CT head no change. ?  Punctate hyperdensity R MCA bifurcation  MRI   scattered right MCA territory infarcts  2D Echo  EF 55-60%. No source of embolus   LDL 99  HgbA1c 5.8  HIV negative  SCDs for VTE prophylaxis  On no antithrombotics prior to admission.  On no antithrombotics within 24 hours of TPA.  At time of discharge, placed on aspirin 81 mg daily.    Therapy recommendations:   No therapy needs  Disposition:   Return home  Patient needs further outpatient embolic work-up, though given Christmas holiday tomorrow unable to get TEE done as an inpatient.  Given patient's stability, will discharge home and have them arrange as an outpatient at the end of the week or next week.  She will also need outpatient evaluation for loop  recorder placement Question Hypertension  Home Meds: Metoprolol 50 daily, Tribenzor 40-10-25 daily   Permissive hypertension < 180 per post tPA protocol allowed in hospital   Resume home blood pressure medicines at time of discharge   Long-term BP goal normotensive  Hyperlipidemia  Home meds:   Zetia 10  LDL 99, goal < 70  No intolerance for statins identified.  Add statin at d/c.  Discontinue Zetia.   Continue statin at discharge  Other Stroke Risk Factors  Former cigarette smoker  Obesity, Body mass index is 32.88 kg/m., recommend weight loss, diet and exercise as appropriate   Other Active Problems  Tinnitus, has seen neurology for this in the past  Tremor, has seen neurology for this in the past  Sjogren's disease on Abilify  Anxiety/depression on Celexa   DISCHARGE EXAM Blood pressure 123/70, pulse 74, temperature 97.9 F (36.6 C), temperature source Oral, resp. rate 20, height 5\' 4"  (1.626 m), weight 86.9 kg, SpO2 94 %.  Exam: NAD, pleasant                  Speech:    Speech is normal; fluent and spontaneous with normal comprehension.  Cognition:    The patient is oriented to person, place, and time;     recent and remote memory intact;     language fluent;    Cranial Nerves:    The pupils are equal, round, and reactive to light.Trigeminal sensation is intact and the muscles of mastication are normal. The face is symmetric. The palate elevates in the midline. Hearing intact. Voice is normal. Shoulder shrug is normal. The tongue has normal motion without fasciculations.   Coordination:  No dysmetria  Motor Observation:    No asymmetry, no atrophy, and no involuntary movements noted. Tone:    Normal muscle tone.     Strength:    Strength is V/V in the upper and lower limbs.      Sensation: intact to LT     Discharge Diet   heart healthy thin liquids  DISCHARGE PLAN  Disposition: Return home  Outpatient TEE by cardiology to evaluate  for embolic source of stroke.  Requested procedure end of this week or next.  EP evaluation for loop recorder placement post TEE.  Request in place.  aspirin 81 mg daily for secondary stroke prevention.  Ongoing risk factor control by Primary Care Physician at time of discharge  Follow-up [name], MD in 2 weeks.  Follow-up in [facility] in 4 weeks, office to schedule an appointment.   35 minutes were spent preparing discharge.  Burnetta Sabin, MSN, APRN, ANVP-BC, AGPCNP-BC Advanced Practice Stroke Nurse [place] for Schedule & Pager information 09/30/2018 2:59 PM    Personally examined patient and images, and have participated in and made any corrections needed to history, physical, neuro exam,assessment and plan as stated above.  I have personally obtained the history, evaluated lab date, reviewed imaging studies and agree with radiology interpretations.    Sarina Ill, MD Stroke Neurology   A total of 35 minutes was spent for the care of this patient, spent on counseling patient and family on different diagnostic and therapeutic options, counseling and coordination of care, riskd ans benefits of management, compliance, or risk factor reduction and education.

## 2018-09-30 NOTE — Evaluation (Signed)
Physical Therapy Evaluation and Discharge Patient Details Name: Breanna Perry MRN: [id number] DOB: 1956/06/23 Today's Date: 09/30/2018   History of Present Illness  Breanna Perry is a 62 y.o. female with history of tinnitus, hypertension, hyperlipidemia and Sjogren's disease. Pt was a work and co-worker noticed AMS and L facial droop. MRI shows acute infarction in deep insula and R temporal, parietal junction, R lateral temporal lobe.  Clinical Impression  Pt admitted with above. Pt functioning very close to baseline and reports "I feel normal." Pt with no further acute PT service needs at this time. Please re-consult if needed in future. Educated pt on if she experienced symptoms again to come back to ED. PT SIGNING OFF.     Follow Up Recommendations No PT follow up;Supervision - Intermittent    Equipment Recommendations  None recommended by PT    Recommendations for Other Services       Precautions / Restrictions Precautions Precautions: Fall Restrictions Weight Bearing Restrictions: No      Mobility  Bed Mobility Overal bed mobility: Modified Independent             General bed mobility comments: HOB elevated, no physical assist  Transfers Overall transfer level: Modified independent Equipment used: None             General transfer comment: no difficulty, steady  Ambulation/Gait Ambulation/Gait assistance: Min guard Gait Distance (Feet): 350 Feet Assistive device: None Gait Pattern/deviations: WFL(Within Functional Limits) Gait velocity: wfl Gait velocity interpretation: >2.62 ft/sec, indicative of community ambulatory General Gait Details: no episodes of instability, able to maintain balance with moderate pertebations  Stairs Stairs: Yes Stairs assistance: Min guard Stair Management: One rail Right Number of Stairs: 14 General stair comments: ascended reciprocally without difficulty  Wheelchair Mobility    Modified Rankin (Stroke Patients  Only) Modified Rankin (Stroke Patients Only) Pre-Morbid Rankin Score: No symptoms Modified Rankin: No significant disability     Balance Overall balance assessment: Mild deficits observed, not formally tested                                           Pertinent Vitals/Pain Pain Assessment: No/denies pain    Home Living Family/patient expects to be discharged to:: Private residence Living Arrangements: Alone Available Help at Discharge: Friend(s);Available 24 hours/day Type of Home: House(town home) Home Access: Stairs to enter Entrance Stairs-Rails: None Entrance Stairs-Number of Steps: 1 Home Layout: Two level Home Equipment: None      Prior Function Level of Independence: Independent         Comments: drives, manages meds, sales rep, parts for compressors     Hand Dominance   Dominant Hand: Right    Extremity/Trunk Assessment   Upper Extremity Assessment Upper Extremity Assessment: Overall WFL for tasks assessed    Lower Extremity Assessment Lower Extremity Assessment: Defer to PT evaluation    Cervical / Trunk Assessment Cervical / Trunk Assessment: Normal  Communication   Communication: No difficulties  Cognition Arousal/Alertness: Awake/alert Behavior During Therapy: WFL for tasks assessed/performed Overall Cognitive Status: Within Functional Limits for tasks assessed                                        General Comments General comments (skin integrity, edema, etc.): VSS    Exercises  Assessment/Plan    PT Assessment Patent does not need any further PT services  PT Problem List         PT Treatment Interventions      PT Goals (Current goals can be found in the Care Plan section)  Acute Rehab PT Goals Patient Stated Goal: home today PT Goal Formulation: All assessment and education complete, DC therapy    Frequency     Barriers to discharge        Co-evaluation PT/OT/SLP  Co-Evaluation/Treatment: Yes Reason for Co-Treatment: Complexity of the patient's impairments (multi-system involvement) PT goals addressed during session: Mobility/safety with mobility OT goals addressed during session: ADL's and self-care       AM-PAC PT "6 Clicks" Mobility  Outcome Measure Help needed turning from your back to your side while in a flat bed without using bedrails?: None Help needed moving from lying on your back to sitting on the side of a flat bed without using bedrails?: None Help needed moving to and from a bed to a chair (including a wheelchair)?: None Help needed standing up from a chair using your arms (e.g., wheelchair or bedside chair)?: None Help needed to walk in hospital room?: A Little Help needed climbing 3-5 steps with a railing? : A Little 6 Click Score: 22    End of Session Equipment Utilized During Treatment: Gait belt Activity Tolerance: Patient tolerated treatment well Patient left: in bed;with call bell/phone within reach Nurse Communication: Mobility status PT Visit Diagnosis: Muscle weakness (generalized) (M62.81)    Time: 1319-1340 PT Time Calculation (min) (ACUTE ONLY): 21 min   Charges:   PT Evaluation $PT Eval Low Complexity: 1 Low          Kittie Plater, PT, DPT Acute Rehabilitation Services Pager #: [phone] Office #: [phone]   [place]line Lopes 09/30/2018, 1:45 PM

## 2018-09-30 NOTE — Progress Notes (Signed)
Occupational Therapy  PTA, pt lived alone independently with her dog [name] and was a Biochemist, clinical. Pt appears close to baseline level of function. Educated pt on warning signs/symptoms of CVA using [employer]. Educated pt on recommendation to have supervision initially with financial and medication management. Once pt returns to work, if pt has any difficulty with performing her job duties, recommend pt call her MD to set up outpt OT. Pt verbalized understanding. OT signing off.     09/30/18 1339  OT Visit Information  Last OT Received On 09/30/18  Assistance Needed +1  PT/OT/SLP Co-Evaluation/Treatment Yes  Reason for Co-Treatment Complexity of the patient's impairments (multi-system involvement)  OT goals addressed during session ADL's and self-care  History of Present Illness Breanna Perry is a 62 y.o. female with history of tinnitus, hypertension, hyperlipidemia and Sjogren's disease. Pt was a work and co-worker noticed AMS and L facial droop. MRI shows acute infarction in deep insula and R temporal, parietal junction, R lateral temporal lobe.  Precautions  Precautions Fall  Restrictions  Weight Bearing Restrictions No  Home Living  Family/patient expects to be discharged to: Private residence  Living Arrangements Alone  Available Help at Discharge Friend(s);Available 24 hours/day  Type of Home House (town home)  [place] to enter  Entrance Stairs-Number of Steps 1  Entrance Stairs-Rails None  Home Layout Two level  Alternate Level Stairs-Number of Steps flight  Alternate Level Stairs-Rails Right  Bathroom Therapist, music None  Prior Function  Level of Independence Independent  Comments drives, manages meds, sales rep, parts for compressors  Communication  Communication No difficulties  Pain Assessment  Pain Assessment No/denies pain  Cognition  Arousal/Alertness Awake/alert  Behavior During Therapy WFL for tasks  assessed/performed  Overall Cognitive Status Within Functional Limits for tasks assessed Intact for immediate and delayed recall; abl eot complete serial subtraction; able to problem solve through money management scenario; able to describe her medication schedule  Upper Extremity Assessment  Upper Extremity Assessment Overall WFL for tasks assessed  Lower Extremity Assessment  Lower Extremity Assessment Defer to PT evaluation  Cervical / Trunk Assessment  Cervical / Trunk Assessment Normal  ADL  Overall ADL's  At baseline  General ADL Comments Able to complete basic ADL tasks close to baseline level  Vision- History  Baseline Vision/History Wears glasses  Wears Glasses Reading only  Patient Visual Report Blurring of vision  Vision- Assessment  Additional Comments Pt repors vision has returned to normal  Perception  Comments appeasr [facility]  Praxis  Praxis tested? WFL  Bed Mobility  Overal bed mobility Modified Independent  General bed mobility comments HOB elevated, no physical assist  Transfers  Overall transfer level Modified independent  Equipment used None  General transfer comment no difficulty, steady  Balance  Overall balance assessment Mild deficits observed, not formally tested  General Comments  General comments (skin integrity, edema, etc.) VSS  OT - End of Session  Equipment Utilized During Treatment Gait belt  Activity Tolerance Patient tolerated treatment well  Patient left in chair  Nurse Communication Mobility status  OT Assessment  OT Recommendation/Assessment Patient does not need any further OT services  OT Visit Diagnosis Unsteadiness on feet (R26.81)  OT Problem List Decreased safety awareness  AM-PAC OT "6 Clicks" Daily Activity Outcome Measure (Version 2)  Help from another person eating meals? 4  Help from another person taking care of personal grooming? 4  Help from another person toileting,  which includes using toliet, bedpan, or urinal? 4  Help  from another person bathing (including washing, rinsing, drying)? 4  Help from another person to put on and taking off regular upper body clothing? 4  Help from another person to put on and taking off regular lower body clothing? 4  6 Click Score 24  OT Recommendation  Follow Up Recommendations No OT follow up;Supervision - Intermittent  OT Equipment None recommended by OT  Acute Rehab OT Goals  Patient Stated Goal "home today"  OT Goal Formulation All assessment and education complete, DC therapy  OT Time Calculation  OT Start Time (ACUTE ONLY) 1319  OT Stop Time (ACUTE ONLY) 1336  OT Time Calculation (min) 17 min  OT General Charges  $OT Visit 1 Visit  OT Evaluation  $OT Eval Moderate Complexity 1 Mod  Written Expression  Dominant Hand Right  Maurie Boettcher, OT/L   Acute OT Clinical Specialist Acute Rehabilitation Services Pager [phone] Office [phone]

## 2018-10-02 ENCOUNTER — Telehealth: Payer: Self-pay | Admitting: Cardiology

## 2018-10-02 NOTE — Telephone Encounter (Signed)
    [facility] has been requested to perform a transesophageal echocardiogram on [facility] for stroke.  After careful review of history and examination, the risks and benefits of transesophageal echocardiogram have been explained including risks of esophageal damage, perforation (1:10,000 risk), bleeding, pharyngeal hematoma as well as other potential complications associated with conscious sedation including aspiration, arrhythmia, respiratory failure and death. Alternatives to treatment were discussed, questions were answered. Patient is willing to proceed.   The outpatient procedure is scheduled for Monday 10/06/18 at 8:00 with Dr. Oval Linsey. I have provided instruction to the patient via phone.   Daune Perch, NP  10/02/2018 8:24 AM

## 2018-10-05 ENCOUNTER — Encounter (HOSPITAL_COMMUNITY): Payer: Self-pay | Admitting: Anesthesiology

## 2018-10-05 NOTE — Anesthesia Preprocedure Evaluation (Deleted)
Anesthesia Evaluation    Reviewed: Allergy & Precautions, H&P , Patient's Chart, lab work & pertinent test results, reviewed documented beta blocker date and time   Airway        Dental   Pulmonary neg pulmonary ROS, former smoker,           Cardiovascular Exercise Tolerance: Good hypertension, Pt. on medications and Pt. on home beta blockers negative cardio ROS       Neuro/Psych Anxiety Depression CVA negative neurological ROS  negative psych ROS   GI/Hepatic negative GI ROS, Neg liver ROS,   Endo/Other  negative endocrine ROS  Renal/GU negative Renal ROS  negative genitourinary   Musculoskeletal   Abdominal   Peds  Hematology negative hematology ROS (+)   Anesthesia Other Findings   Reproductive/Obstetrics negative OB ROS                             Anesthesia Physical Anesthesia Plan  ASA: III  Anesthesia Plan: MAC   Post-op Pain Management:    Induction: Intravenous  PONV Risk Score and Plan: 2 and Propofol infusion and Treatment may vary due to age or medical condition  Airway Management Planned: Nasal Cannula  Additional Equipment:   Intra-op Plan:   Post-operative Plan:   Informed Consent: I have reviewed the patients History and Physical, chart, labs and discussed the procedure including the risks, benefits and alternatives for the proposed anesthesia with the patient or authorized representative who has indicated his/her understanding and acceptance.   Dental advisory given  Plan Discussed with: CRNA  Anesthesia Plan Comments:         Anesthesia Quick Evaluation

## 2018-10-06 ENCOUNTER — Encounter (HOSPITAL_COMMUNITY): Admission: RE | Payer: Self-pay | Source: Home / Self Care

## 2018-10-06 ENCOUNTER — Encounter (HOSPITAL_COMMUNITY): Payer: Self-pay | Admitting: Certified Registered Nurse Anesthetist

## 2018-10-06 ENCOUNTER — Ambulatory Visit (HOSPITAL_COMMUNITY)
Admission: RE | Admit: 2018-10-06 | Payer: BLUE CROSS/BLUE SHIELD | Source: Home / Self Care | Admitting: Cardiovascular Disease

## 2018-10-06 ENCOUNTER — Ambulatory Visit (HOSPITAL_COMMUNITY): Payer: BLUE CROSS/BLUE SHIELD | Attending: Cardiology

## 2018-10-06 SURGERY — ECHOCARDIOGRAM, TRANSESOPHAGEAL
Anesthesia: Monitor Anesthesia Care

## 2018-10-07 DIAGNOSIS — Z8673 Personal history of transient ischemic attack (TIA), and cerebral infarction without residual deficits: Secondary | ICD-10-CM | POA: Diagnosis not present

## 2018-10-07 DIAGNOSIS — I10 Essential (primary) hypertension: Secondary | ICD-10-CM | POA: Diagnosis not present

## 2018-10-07 DIAGNOSIS — I679 Cerebrovascular disease, unspecified: Secondary | ICD-10-CM | POA: Diagnosis not present

## 2018-10-07 DIAGNOSIS — E785 Hyperlipidemia, unspecified: Secondary | ICD-10-CM | POA: Diagnosis not present

## 2018-10-10 ENCOUNTER — Ambulatory Visit (INDEPENDENT_AMBULATORY_CARE_PROVIDER_SITE_OTHER): Payer: BLUE CROSS/BLUE SHIELD | Admitting: Nurse Practitioner

## 2018-10-10 ENCOUNTER — Encounter: Payer: Self-pay | Admitting: Nurse Practitioner

## 2018-10-10 VITALS — BP 108/70 | HR 69 | Ht 64.0 in | Wt 188.8 lb

## 2018-10-10 DIAGNOSIS — I639 Cerebral infarction, unspecified: Secondary | ICD-10-CM | POA: Diagnosis not present

## 2018-10-10 NOTE — Patient Instructions (Addendum)
Medication Instructions:  none If you need a refill on your cardiac medications before your next appointment, please call your pharmacy.   Lab work:TODAY CBC BMET If you have labs (blood work) drawn today and your tests are completely normal, you will receive your results only by: Marland Kitchen MyChart Message (if you have MyChart) OR . A paper copy in the mail If you have any lab test that is abnormal or we need to change your treatment, we will call you to review the results.  Testing/Procedures: Your physician has requested that you have a TEE. During a TEE, sound waves are used to create images of your heart. It provides your doctor with information about the size and shape of your heart and how well your heart's chambers and valves are working. In this test, a transducer is attached to the end of a flexible tube that's guided down your throat and into your esophagus (the tube leading from you mouth to your stomach) to get a more detailed image of your heart. You are not awake for the procedure. Please see the instruction sheet given to you today. For further information please visit [email or web address].   LOOP RECORDER  Follow-Up:  WOUND CHECK WITH DEVICE CLINIC 7-10 DAYS POST 10/21/2018 At [facility], you and your health needs are our priority.  As part of our continuing mission to provide you with exceptional heart care, we have created designated Provider Care Teams.  These Care Teams include your primary Cardiologist (physician) and Advanced Practice Providers (APPs -  Physician Assistants and Nurse Practitioners) who all work together to provide you with the care you need, when you need it. .   Any Other Special Instructions Will Be Listed Below (If Applicable). Dear Breanna Perry You are scheduled for a TEE on 10/21/2018 with Dr. Margaretann Loveless.  Please arrive at the [facility] (Main Entrance A) at [facility]: [address] [place], [place] [address] at 12:00 pm. (1 hour prior to procedure  unless lab work is needed DIET: Nothing to eat or drink after midnight except a sip of water with medications (see medication instructions below)    You must have a responsible person to drive you home and stay in the waiting area during your procedure. Failure to do so could result in cancellation.  Bring your insurance cards.  *Special Note: Every effort is made to have your procedure done on time. Occasionally there are emergencies that occur at the hospital that may cause delays. Please be patient if a delay does occur.

## 2018-10-10 NOTE — Progress Notes (Signed)
ELECTROPHYSIOLOGY CONSULT NOTE  Patient ID: Breanna Perry MRN: [id number], DOB/AGE: [birth year]   Primary Physician: [name], MD Primary Cardiologist: new to [facility] Reason for Consultation: Cryptogenic stroke; recommendations regarding Implantable Loop Recorder  History of Present Illness EP has been asked to evaluate Breanna Perry for placement of an implantable loop recorder to monitor for atrial fibrillation by Dr Jaynee Eagles.  The patient was admitted on 09/29/18 with left facial droop.  They first developed symptoms while at work the morning of admission.  Imaging demonstrated scattered right MCA infarcts felt to be embolic 2/2 unknown source.  She has undergone workup for stroke including echocardiogram and carotid imaging.  The patient was monitored on telemetry which has demonstrated sinus rhythm with no arrhythmias.  Stroke work-up is to be completed with a TEE.   Echocardiogram 09/29/18 demonstrated EF 55-60%, no RWMA, LA 38.  Lab work is reviewed.  The patient denies chest pain, shortness of breath, dizziness, palpitations, or syncope.  She has ongoing fatigue but no other residual symptoms.   Past Medical History:  Diagnosis Date  . Anxiety   . Dental bridge present    upper and lower  . Depression   . Hypertension    under control with med., has been on med. x 8 yr.  . Medial meniscus tear 12/2012   left  . Sjogren's syndrome [facility])      Surgical History:  Past Surgical History:  Procedure Laterality Date  . APPENDECTOMY  04/1991  . KNEE ARTHROSCOPY Left 01/07/2013   Procedure: LEFT KNEE ARTHROSCOPY PARTIAL MEDIAL MENISECTOMY CHRONDOPLASTY MEDIAL PLICA RESECTION;  Surgeon: Alta Corning, MD;  Location: [facility];  Service: Orthopedics;  Laterality: Left;  . KNEE SURGERY  06/22/2015  . LIVER BIOPSY  summer 2011  . LUNG BIOPSY  summer 2011  . PARTIAL HYSTERECTOMY  04/1991   partial      Current Outpatient Medications:  .  ARIPiprazole  (ABILIFY) 2 MG tablet, Take 2 mg by mouth daily., Disp: , Rfl:  .  aspirin EC 81 MG EC tablet, Take 1 tablet (81 mg total) by mouth daily., Disp: , Rfl:  .  atorvastatin (LIPITOR) 40 MG tablet, Take 1 tablet (40 mg total) by mouth daily at 6 PM., Disp: 30 tablet, Rfl: 2 .  cetirizine (ZYRTEC) 10 MG tablet, Take 10 mg by mouth daily as needed for allergies. , Disp: , Rfl:  .  citalopram (CELEXA) 40 MG tablet, Take 40 mg by mouth daily. , Disp: , Rfl: 1 .  colesevelam (WELCHOL) 625 MG tablet, Take 1,875 mg by mouth 2 (two) times daily., Disp: , Rfl:  .  ibuprofen (ADVIL,MOTRIN) 200 MG tablet, Take 400-600 mg by mouth daily as needed for headache or moderate pain., Disp: , Rfl:  .  metoprolol (TOPROL-XL) 50 MG 24 hr tablet, Take 50 mg by mouth daily. , Disp: , Rfl:  .  Olmesartan-Amlodipine-HCTZ (TRIBENZOR) 40-10-25 MG TABS, Take 1 tablet by mouth daily. , Disp: , Rfl:  .  phentermine 37.5 MG capsule, Take 18.75-37.5 mg by mouth See admin instructions. Take 37.5 mg in the morning and 18.75 mg in the afternoon, Disp: , Rfl:    Allergies: No Known Allergies  Social History   Socioeconomic History  . Marital status: Single    Spouse name: Not on file  . Number of children: N  . Years of education: Not on file  . Highest education level: Not on file  Occupational History  . Occupation: parts  specialist [employer]   Social Needs  . Financial resource strain: Not on file  . Food insecurity:    Worry: Not on file    Inability: Not on file  . Transportation needs:    Medical: Not on file    Non-medical: Not on file  Tobacco Use  . Smoking status: Former Smoker    Packs/day: 1.00    Years: 31.00    Pack years: 31.00    Types: Cigarettes    Last attempt to quit: 10/08/2000    Years since quitting: 18.0  . Smokeless tobacco: Never Used  Substance and Sexual Activity  . Alcohol use: Yes    Comment: rarely  . Drug use: No  . Sexual activity: Not on file  Lifestyle  . Physical  activity:    Days per week: Not on file    Minutes per session: Not on file  . Stress: Not on file  Relationships  . Social connections:    Talks on phone: Not on file    Gets together: Not on file    Attends religious service: Not on file    Active member of club or organization: Not on file    Attends meetings of clubs or organizations: Not on file    Relationship status: Not on file  . Intimate partner violence:    Fear of current or ex partner: Not on file    Emotionally abused: Not on file    Physically abused: Not on file    Forced sexual activity: Not on file  Other Topics Concern  . Not on file  Social History Narrative  . Not on file     Family History  Problem Relation Age of Onset  . Hypertension Father   . Emphysema Father   . Heart attack Father   . Cirrhosis Father   . Coronary artery disease Mother   . Heart failure Mother   . COPD Mother   . Breast cancer Maternal Grandmother   . HIV Brother   . Diabetes Sister       Review of Systems: All other systems reviewed and are otherwise negative except as noted above.  Physical Exam: Vitals:   10/10/18 1248  BP: 108/70  Pulse: 69  SpO2: 98%  Weight: 188 lb 12.8 oz (85.6 kg)  Height: 5\' 4"  (1.626 m)    GEN- The patient is well appearing, alert and oriented x 3 today.   Head- normocephalic, atraumatic Eyes-  Sclera clear, conjunctiva pink Ears- hearing intact Oropharynx- clear Neck- supple Lungs- Clear to ausculation bilaterally, normal work of breathing Heart- Regular rate and rhythm  GI- soft, NT, ND, + BS Extremities- no clubbing, cyanosis, or edema MS- no significant deformity or atrophy Skin- no rash or lesion Psych- euthymic mood, full affect   Labs:   Lab Results  Component Value Date   WBC 3.5 (L) 09/29/2018   HGB 12.9 09/29/2018   HCT 38.0 09/29/2018   MCV 94.1 09/29/2018   PLT 337 09/29/2018   No results for input(s): NA, K, CL, CO2, BUN, CREATININE, CALCIUM, PROT, BILITOT,  ALKPHOS, ALT, AST, GLUCOSE in the last 168 hours.  Invalid input(s): LABALBU   Radiology/Studies: Ct Angio Head W Or Wo Contrast  Result Date: 09/29/2018 CLINICAL DATA:  Altered level of consciousness.  Stroke. EXAM: CT ANGIOGRAPHY HEAD AND NECK TECHNIQUE: Multidetector CT imaging of the head and neck was performed using the standard protocol during bolus administration of intravenous contrast. Multiplanar CT image reconstructions and  MIPs were obtained to evaluate the vascular anatomy. Carotid stenosis measurements (when applicable) are obtained utilizing NASCET criteria, using the distal internal carotid diameter as the denominator. CONTRAST:  81mL ISOVUE-370 IOPAMIDOL (ISOVUE-370) INJECTION 76% COMPARISON:  CT head 09/29/2018 FINDINGS: CTA NECK FINDINGS Aortic arch: Normal aortic arch.  Normal proximal great vessels. Right carotid system: No significant right carotid stenosis. Bifurcation widely patent. Left carotid system: Left carotid widely patent with mild atherosclerotic disease left bifurcation Vertebral arteries: Both vertebral arteries widely patent without stenosis. Skeleton: Cervical spine degenerative change.  No acute abnormality. Other neck: Negative for mass or adenopathy. Upper chest: Negative Review of the MIP images confirms the above findings CTA HEAD FINDINGS Anterior circulation: Cavernous carotid widely patent bilaterally. Anterior cerebral arteries widely patent bilaterally. Left middle cerebral artery widely patent Filling defect in the distal right M1 segment at the bifurcation. This is causing subtotal occlusion with possible distal branch occlusion. Probable embolus. Posterior circulation: Both vertebral arteries patent to the basilar. PICA patent bilaterally. Basilar is hypoplastic with fetal origin of the posterior cerebral artery bilaterally. PCA patent bilaterally. Venous sinuses: Patent Anatomic variants: None Delayed phase: Not perform Review of the MIP images confirms the  above findings IMPRESSION: Focal stenosis right MCA bifurcation compatible with embolus. Possible branch occlusion right M2. No significant carotid or vertebral stenosis in the neck. These results were called by telephone at the time of interpretation on 09/29/2018 at 12:05 pm to Dr. Lorraine Lax , who verbally acknowledged these results. Electronically Signed   By: Franchot Gallo M.D.   On: 09/29/2018 12:15   Ct Head Wo Contrast  Result Date: 09/29/2018 CLINICAL DATA:  Code stroke follow-up. Frontal headache. Right MCA embolus and M2 occlusion. EXAM: CT HEAD WITHOUT CONTRAST TECHNIQUE: Contiguous axial images were obtained from the base of the skull through the vertex without intravenous contrast. COMPARISON:  Earlier same day FINDINGS: Brain: Continued normal appearance of the brain itself without evidence of identifiable acute infarction, mass lesion, hemorrhage, hydrocephalus or extra-axial collection. Aspects remains 10. Vascular: Question punctate hyperdensity of the vessel at the right MCA bifurcation, consistent with the location of the small embolus shown by CTA. Skull: Normal Sinuses/Orbits: Clear Other: Negative IMPRESSION: No change in appearance of the brain parenchyma. No evidence of developing infarction by CT. Question punctate hyperdensity at the right MCA bifurcation, which would be consistent with the small embolus shown by CT angiography. Electronically Signed   By: Nelson Chimes M.D.   On: 09/29/2018 13:30   Ct Angio Neck W Or Wo Contrast  Result Date: 09/29/2018 CLINICAL DATA:  Altered level of consciousness.  Stroke. EXAM: CT ANGIOGRAPHY HEAD AND NECK TECHNIQUE: Multidetector CT imaging of the head and neck was performed using the standard protocol during bolus administration of intravenous contrast. Multiplanar CT image reconstructions and MIPs were obtained to evaluate the vascular anatomy. Carotid stenosis measurements (when applicable) are obtained utilizing NASCET criteria, using the  distal internal carotid diameter as the denominator. CONTRAST:  19mL ISOVUE-370 IOPAMIDOL (ISOVUE-370) INJECTION 76% COMPARISON:  CT head 09/29/2018 FINDINGS: CTA NECK FINDINGS Aortic arch: Normal aortic arch.  Normal proximal great vessels. Right carotid system: No significant right carotid stenosis. Bifurcation widely patent. Left carotid system: Left carotid widely patent with mild atherosclerotic disease left bifurcation Vertebral arteries: Both vertebral arteries widely patent without stenosis. Skeleton: Cervical spine degenerative change.  No acute abnormality. Other neck: Negative for mass or adenopathy. Upper chest: Negative Review of the MIP images confirms the above findings CTA HEAD FINDINGS Anterior circulation:  Cavernous carotid widely patent bilaterally. Anterior cerebral arteries widely patent bilaterally. Left middle cerebral artery widely patent Filling defect in the distal right M1 segment at the bifurcation. This is causing subtotal occlusion with possible distal branch occlusion. Probable embolus. Posterior circulation: Both vertebral arteries patent to the basilar. PICA patent bilaterally. Basilar is hypoplastic with fetal origin of the posterior cerebral artery bilaterally. PCA patent bilaterally. Venous sinuses: Patent Anatomic variants: None Delayed phase: Not perform Review of the MIP images confirms the above findings IMPRESSION: Focal stenosis right MCA bifurcation compatible with embolus. Possible branch occlusion right M2. No significant carotid or vertebral stenosis in the neck. These results were called by telephone at the time of interpretation on 09/29/2018 at 12:05 pm to Dr. Lorraine Lax , who verbally acknowledged these results. Electronically Signed   By: Franchot Gallo M.D.   On: 09/29/2018 12:15   Mr Brain Wo Contrast  Result Date: 09/30/2018 CLINICAL DATA:  TPA administration. Headache beginning yesterday. Left-sided facial droop. EXAM: MRI HEAD WITHOUT CONTRAST TECHNIQUE:  Multiplanar, multiecho pulse sequences of the brain and surrounding structures were obtained without intravenous contrast. COMPARISON:  Multiple CT studies done yesterday. FINDINGS: Brain: Diffusion imaging shows acute infarction within the deep insula, the right lateral temporal lobe and the temporoparietal junction. No large confluent infarction. No sign of mass lesion, hemorrhage, hydrocephalus or extra-axial collection. Minimal petechial blood products in the deep insula. No evidence of pre-existing infarction. Vascular: Major vessels at the base of the brain show flow. Skull and upper cervical spine: Negative Sinuses/Orbits: Clear/normal Other: None IMPRESSION: Scattered areas of acute infarction in the right MCA territory as outlined above. The largest region affects the deep insula. Minimal petechial blood products in that location. No frank hematoma. Electronically Signed   By: Nelson Chimes M.D.   On: 09/30/2018 11:17   Ct Head Code Stroke Wo Contrast  Result Date: 09/29/2018 CLINICAL DATA:  Code stroke.  Altered level of consciousness EXAM: CT HEAD WITHOUT CONTRAST TECHNIQUE: Contiguous axial images were obtained from the base of the skull through the vertex without intravenous contrast. COMPARISON:  MRI 04/19/2014 FINDINGS: Brain: No evidence of acute infarction, hemorrhage, hydrocephalus, extra-axial collection or mass lesion/mass effect. Vascular: Negative for hyperdense vessel Skull: Negative Sinuses/Orbits: Negative Other: None ASPECTS ([place] Stroke Program Early CT Score) - Ganglionic level infarction (caudate, lentiform nuclei, internal capsule, insula, M1-M3 cortex): 7 - Supraganglionic infarction (M4-M6 cortex): 3 Total score (0-10 with 10 being normal): 10 IMPRESSION: 1. Negative CT head 2. ASPECTS is 10 3. These results were called by telephone at the time of interpretation on 09/29/2018 at 12:05 pm to Dr. Lorraine Lax , who verbally acknowledged these results. Electronically Signed   By: Franchot Gallo M.D.   On: 09/29/2018 12:03    12-lead ECG sinus rhythm (personally reviewed) All prior EKG's in EPIC reviewed with no documented atrial fibrillation  Telemetry sinus rhythm (personally reviewed)  Assessment and Plan:  1. Cryptogenic stroke The patient presented with cryptogenic stroke.  TEE and ILR have been recommended by neurology.  I spoke at length with the patient about monitoring for afib with an implantable loop recorder.  Risks, benefits, and alteratives to implantable loop recorder were discussed with the patient today.   At this time, the patient is very clear in their decision to proceed with implantable loop recorder. Risks, benefits to TEE were also reviewed with the patient who wishes to proceed.  Will schedule at next available time.   She has questions about resuming Phenterimine. She  does not yet have follow up with neurology. I have encouraged her to follow up with them and let them guide decision on when to restart.    Chanetta Marshall, NP 10/10/2018 1:05 PM

## 2018-10-10 NOTE — H&P (View-Only) (Signed)
ELECTROPHYSIOLOGY CONSULT NOTE  Patient ID: Breanna Perry MRN: [id number], DOB/AGE: 06/07/1956   Primary Physician: [name], MD Primary Cardiologist: new [facility] Reason for Consultation: Cryptogenic stroke; recommendations regarding Implantable Loop Recorder  History of Present Illness EP has been asked to evaluate Breanna Perry for placement of an implantable loop recorder to monitor for atrial fibrillation by Dr Jaynee Eagles.  The patient was admitted on 09/29/18 with left facial droop.  They first developed symptoms while at work the morning of admission.  Imaging demonstrated scattered right MCA infarcts felt to be embolic 2/2 unknown source.  She has undergone workup for stroke including echocardiogram and carotid imaging.  The patient was monitored on telemetry which has demonstrated sinus rhythm with no arrhythmias.  Stroke work-up is to be completed with a TEE.   Echocardiogram 09/29/18 demonstrated EF 55-60%, no RWMA, LA 38.  Lab work is reviewed.  The patient denies chest pain, shortness of breath, dizziness, palpitations, or syncope.  She has ongoing fatigue but no other residual symptoms.   Past Medical History:  Diagnosis Date  . Anxiety   . Dental bridge present    upper and lower  . Depression   . Hypertension    under control with med., has been on med. x 8 yr.  . Medial meniscus tear 12/2012   left  . Sjogren's syndrome [facility])      Surgical History:  Past Surgical History:  Procedure Laterality Date  . APPENDECTOMY  04/1991  . KNEE ARTHROSCOPY Left 01/07/2013   Procedure: LEFT KNEE ARTHROSCOPY PARTIAL MEDIAL MENISECTOMY CHRONDOPLASTY MEDIAL PLICA RESECTION;  Surgeon: Alta Corning, MD;  Location: [place];  Service: Orthopedics;  Laterality: Left;  . KNEE SURGERY  06/22/2015  . LIVER BIOPSY  summer 2011  . LUNG BIOPSY  summer 2011  . PARTIAL HYSTERECTOMY  04/1991   partial      Current Outpatient Medications:  .  ARIPiprazole  (ABILIFY) 2 MG tablet, Take 2 mg by mouth daily., Disp: , Rfl:  .  aspirin EC 81 MG EC tablet, Take 1 tablet (81 mg total) by mouth daily., Disp: , Rfl:  .  atorvastatin (LIPITOR) 40 MG tablet, Take 1 tablet (40 mg total) by mouth daily at 6 PM., Disp: 30 tablet, Rfl: 2 .  cetirizine (ZYRTEC) 10 MG tablet, Take 10 mg by mouth daily as needed for allergies. , Disp: , Rfl:  .  citalopram (CELEXA) 40 MG tablet, Take 40 mg by mouth daily. , Disp: , Rfl: 1 .  colesevelam (WELCHOL) 625 MG tablet, Take 1,875 mg by mouth 2 (two) times daily., Disp: , Rfl:  .  ibuprofen (ADVIL,MOTRIN) 200 MG tablet, Take 400-600 mg by mouth daily as needed for headache or moderate pain., Disp: , Rfl:  .  metoprolol (TOPROL-XL) 50 MG 24 hr tablet, Take 50 mg by mouth daily. , Disp: , Rfl:  .  Olmesartan-Amlodipine-HCTZ (TRIBENZOR) 40-10-25 MG TABS, Take 1 tablet by mouth daily. , Disp: , Rfl:  .  phentermine 37.5 MG capsule, Take 18.75-37.5 mg by mouth See admin instructions. Take 37.5 mg in the morning and 18.75 mg in the afternoon, Disp: , Rfl:    Allergies: No Known Allergies  Social History   Socioeconomic History  . Marital status: Single    Spouse name: Not on file  . Number of children: N  . Years of education: Not on file  . Highest education level: Not on file  Occupational History  . Occupation: parts  specialist [employer]   Social Needs  . Financial resource strain: Not on file  . Food insecurity:    Worry: Not on file    Inability: Not on file  . Transportation needs:    Medical: Not on file    Non-medical: Not on file  Tobacco Use  . Smoking status: Former Smoker    Packs/day: 1.00    Years: 31.00    Pack years: 31.00    Types: Cigarettes    Last attempt to quit: 10/08/2000    Years since quitting: 18.0  . Smokeless tobacco: Never Used  Substance and Sexual Activity  . Alcohol use: Yes    Comment: rarely  . Drug use: No  . Sexual activity: Not on file  Lifestyle  . Physical  activity:    Days per week: Not on file    Minutes per session: Not on file  . Stress: Not on file  Relationships  . Social connections:    Talks on phone: Not on file    Gets together: Not on file    Attends religious service: Not on file    Active member of club or organization: Not on file    Attends meetings of clubs or organizations: Not on file    Relationship status: Not on file  . Intimate partner violence:    Fear of current or ex partner: Not on file    Emotionally abused: Not on file    Physically abused: Not on file    Forced sexual activity: Not on file  Other Topics Concern  . Not on file  Social History Narrative  . Not on file     Family History  Problem Relation Age of Onset  . Hypertension Father   . Emphysema Father   . Heart attack Father   . Cirrhosis Father   . Coronary artery disease Mother   . Heart failure Mother   . COPD Mother   . Breast cancer Maternal Grandmother   . HIV Brother   . Diabetes Sister       Review of Systems: All other systems reviewed and are otherwise negative except as noted above.  Physical Exam: Vitals:   10/10/18 1248  BP: 108/70  Pulse: 69  SpO2: 98%  Weight: 188 lb 12.8 oz (85.6 kg)  Height: 5\' 4"  (1.626 m)    GEN- The patient is well appearing, alert and oriented x 3 today.   Head- normocephalic, atraumatic Eyes-  Sclera clear, conjunctiva pink Ears- hearing intact Oropharynx- clear Neck- supple Lungs- Clear to ausculation bilaterally, normal work of breathing Heart- Regular rate and rhythm  GI- soft, NT, ND, + BS Extremities- no clubbing, cyanosis, or edema MS- no significant deformity or atrophy Skin- no rash or lesion Psych- euthymic mood, full affect   Labs:   Lab Results  Component Value Date   WBC 3.5 (L) 09/29/2018   HGB 12.9 09/29/2018   HCT 38.0 09/29/2018   MCV 94.1 09/29/2018   PLT 337 09/29/2018   No results for input(s): NA, K, CL, CO2, BUN, CREATININE, CALCIUM, PROT, BILITOT,  ALKPHOS, ALT, AST, GLUCOSE in the last 168 hours.  Invalid input(s): LABALBU   Radiology/Studies: Ct Angio Head W Or Wo Contrast  Result Date: 09/29/2018 CLINICAL DATA:  Altered level of consciousness.  Stroke. EXAM: CT ANGIOGRAPHY HEAD AND NECK TECHNIQUE: Multidetector CT imaging of the head and neck was performed using the standard protocol during bolus administration of intravenous contrast. Multiplanar CT image reconstructions and  MIPs were obtained to evaluate the vascular anatomy. Carotid stenosis measurements (when applicable) are obtained utilizing NASCET criteria, using the distal internal carotid diameter as the denominator. CONTRAST:  27mL ISOVUE-370 IOPAMIDOL (ISOVUE-370) INJECTION 76% COMPARISON:  CT head 09/29/2018 FINDINGS: CTA NECK FINDINGS Aortic arch: Normal aortic arch.  Normal proximal great vessels. Right carotid system: No significant right carotid stenosis. Bifurcation widely patent. Left carotid system: Left carotid widely patent with mild atherosclerotic disease left bifurcation Vertebral arteries: Both vertebral arteries widely patent without stenosis. Skeleton: Cervical spine degenerative change.  No acute abnormality. Other neck: Negative for mass or adenopathy. Upper chest: Negative Review of the MIP images confirms the above findings CTA HEAD FINDINGS Anterior circulation: Cavernous carotid widely patent bilaterally. Anterior cerebral arteries widely patent bilaterally. Left middle cerebral artery widely patent Filling defect in the distal right M1 segment at the bifurcation. This is causing subtotal occlusion with possible distal branch occlusion. Probable embolus. Posterior circulation: Both vertebral arteries patent to the basilar. PICA patent bilaterally. Basilar is hypoplastic with fetal origin of the posterior cerebral artery bilaterally. PCA patent bilaterally. Venous sinuses: Patent Anatomic variants: None Delayed phase: Not perform Review of the MIP images confirms the  above findings IMPRESSION: Focal stenosis right MCA bifurcation compatible with embolus. Possible branch occlusion right M2. No significant carotid or vertebral stenosis in the neck. These results were called by telephone at the time of interpretation on 09/29/2018 at 12:05 pm to Dr. Lorraine Lax , who verbally acknowledged these results. Electronically Signed   By: Franchot Gallo M.D.   On: 09/29/2018 12:15   Ct Head Wo Contrast  Result Date: 09/29/2018 CLINICAL DATA:  Code stroke follow-up. Frontal headache. Right MCA embolus and M2 occlusion. EXAM: CT HEAD WITHOUT CONTRAST TECHNIQUE: Contiguous axial images were obtained from the base of the skull through the vertex without intravenous contrast. COMPARISON:  Earlier same day FINDINGS: Brain: Continued normal appearance of the brain itself without evidence of identifiable acute infarction, mass lesion, hemorrhage, hydrocephalus or extra-axial collection. Aspects remains 10. Vascular: Question punctate hyperdensity of the vessel at the right MCA bifurcation, consistent with the location of the small embolus shown by CTA. Skull: Normal Sinuses/Orbits: Clear Other: Negative IMPRESSION: No change in appearance of the brain parenchyma. No evidence of developing infarction by CT. Question punctate hyperdensity at the right MCA bifurcation, which would be consistent with the small embolus shown by CT angiography. Electronically Signed   By: Nelson Chimes M.D.   On: 09/29/2018 13:30   Ct Angio Neck W Or Wo Contrast  Result Date: 09/29/2018 CLINICAL DATA:  Altered level of consciousness.  Stroke. EXAM: CT ANGIOGRAPHY HEAD AND NECK TECHNIQUE: Multidetector CT imaging of the head and neck was performed using the standard protocol during bolus administration of intravenous contrast. Multiplanar CT image reconstructions and MIPs were obtained to evaluate the vascular anatomy. Carotid stenosis measurements (when applicable) are obtained utilizing NASCET criteria, using the  distal internal carotid diameter as the denominator. CONTRAST:  38mL ISOVUE-370 IOPAMIDOL (ISOVUE-370) INJECTION 76% COMPARISON:  CT head 09/29/2018 FINDINGS: CTA NECK FINDINGS Aortic arch: Normal aortic arch.  Normal proximal great vessels. Right carotid system: No significant right carotid stenosis. Bifurcation widely patent. Left carotid system: Left carotid widely patent with mild atherosclerotic disease left bifurcation Vertebral arteries: Both vertebral arteries widely patent without stenosis. Skeleton: Cervical spine degenerative change.  No acute abnormality. Other neck: Negative for mass or adenopathy. Upper chest: Negative Review of the MIP images confirms the above findings CTA HEAD FINDINGS Anterior circulation:  Cavernous carotid widely patent bilaterally. Anterior cerebral arteries widely patent bilaterally. Left middle cerebral artery widely patent Filling defect in the distal right M1 segment at the bifurcation. This is causing subtotal occlusion with possible distal branch occlusion. Probable embolus. Posterior circulation: Both vertebral arteries patent to the basilar. PICA patent bilaterally. Basilar is hypoplastic with fetal origin of the posterior cerebral artery bilaterally. PCA patent bilaterally. Venous sinuses: Patent Anatomic variants: None Delayed phase: Not perform Review of the MIP images confirms the above findings IMPRESSION: Focal stenosis right MCA bifurcation compatible with embolus. Possible branch occlusion right M2. No significant carotid or vertebral stenosis in the neck. These results were called by telephone at the time of interpretation on 09/29/2018 at 12:05 pm to Dr. Lorraine Lax , who verbally acknowledged these results. Electronically Signed   By: Franchot Gallo M.D.   On: 09/29/2018 12:15   Mr Brain Wo Contrast  Result Date: 09/30/2018 CLINICAL DATA:  TPA administration. Headache beginning yesterday. Left-sided facial droop. EXAM: MRI HEAD WITHOUT CONTRAST TECHNIQUE:  Multiplanar, multiecho pulse sequences of the brain and surrounding structures were obtained without intravenous contrast. COMPARISON:  Multiple CT studies done yesterday. FINDINGS: Brain: Diffusion imaging shows acute infarction within the deep insula, the right lateral temporal lobe and the temporoparietal junction. No large confluent infarction. No sign of mass lesion, hemorrhage, hydrocephalus or extra-axial collection. Minimal petechial blood products in the deep insula. No evidence of pre-existing infarction. Vascular: Major vessels at the base of the brain show flow. Skull and upper cervical spine: Negative Sinuses/Orbits: Clear/normal Other: None IMPRESSION: Scattered areas of acute infarction in the right MCA territory as outlined above. The largest region affects the deep insula. Minimal petechial blood products in that location. No frank hematoma. Electronically Signed   By: Nelson Chimes M.D.   On: 09/30/2018 11:17   Ct Head Code Stroke Wo Contrast  Result Date: 09/29/2018 CLINICAL DATA:  Code stroke.  Altered level of consciousness EXAM: CT HEAD WITHOUT CONTRAST TECHNIQUE: Contiguous axial images were obtained from the base of the skull through the vertex without intravenous contrast. COMPARISON:  MRI 04/19/2014 FINDINGS: Brain: No evidence of acute infarction, hemorrhage, hydrocephalus, extra-axial collection or mass lesion/mass effect. Vascular: Negative for hyperdense vessel Skull: Negative Sinuses/Orbits: Negative Other: None ASPECTS ([place] Stroke Program Early CT Score) - Ganglionic level infarction (caudate, lentiform nuclei, internal capsule, insula, M1-M3 cortex): 7 - Supraganglionic infarction (M4-M6 cortex): 3 Total score (0-10 with 10 being normal): 10 IMPRESSION: 1. Negative CT head 2. ASPECTS is 10 3. These results were called by telephone at the time of interpretation on 09/29/2018 at 12:05 pm to Dr. Lorraine Lax , who verbally acknowledged these results. Electronically Signed   By: Franchot Gallo M.D.   On: 09/29/2018 12:03    12-lead ECG sinus rhythm (personally reviewed) All prior EKG's in EPIC reviewed with no documented atrial fibrillation  Telemetry sinus rhythm (personally reviewed)  Assessment and Plan:  1. Cryptogenic stroke The patient presented with cryptogenic stroke.  TEE and ILR have been recommended by neurology.  I spoke at length with the patient about monitoring for afib with an implantable loop recorder.  Risks, benefits, and alteratives to implantable loop recorder were discussed with the patient today.   At this time, the patient is very clear in their decision to proceed with implantable loop recorder. Risks, benefits to TEE were also reviewed with the patient who wishes to proceed.  Will schedule at next available time.   She has questions about resuming Phenterimine. She  does not yet have follow up with neurology. I have encouraged her to follow up with them and let them guide decision on when to restart.    Chanetta Marshall, NP 10/10/2018 1:05 PM

## 2018-10-11 LAB — BASIC METABOLIC PANEL
BUN/Creatinine Ratio: 12 (ref 12–28)
BUN: 12 mg/dL (ref 8–27)
CO2: 23 mmol/L (ref 20–29)
CREATININE: 1.04 mg/dL — AB (ref 0.57–1.00)
Calcium: 9.8 mg/dL (ref 8.7–10.3)
Chloride: 103 mmol/L (ref 96–106)
GFR calc non Af Amer: 58 mL/min/{1.73_m2} — ABNORMAL LOW (ref >59)
GFR, EST AFRICAN AMERICAN: 67 mL/min/{1.73_m2} (ref >59)
Glucose: 83 mg/dL (ref 65–99)
Potassium: 4 mmol/L (ref 3.5–5.2)
Sodium: 141 mmol/L (ref 134–144)

## 2018-10-11 LAB — CBC
Hematocrit: 34 % (ref 34.0–46.6)
Hemoglobin: 11.6 g/dL (ref 11.1–15.9)
MCH: 32 pg (ref 26.6–33.0)
MCHC: 34.1 g/dL (ref 31.5–35.7)
MCV: 94 fL (ref 79–97)
Platelets: 389 10*3/uL (ref 150–450)
RBC: 3.63 x10E6/uL — ABNORMAL LOW (ref 3.77–5.28)
RDW: 14.7 % (ref 12.3–15.4)
WBC: 5.8 10*3/uL (ref 3.4–10.8)

## 2018-10-13 ENCOUNTER — Other Ambulatory Visit: Payer: Self-pay | Admitting: *Deleted

## 2018-10-13 NOTE — Patient Outreach (Addendum)
[place] [facility]) Care Management  10/13/2018  Breanna Perry 07-12-1956 [id number]   EMMI- stroke  RED ON EMMI ALERT Day # 9 Date: 10/12/2018 Sunday 1002  Red Alert Reason: Sad, hopeless, anxious, or empty? Yes  Insurance: Blue cross and blue shield  [facility] admissions  ED visits in the last 6 months    Outreach attempt # 1 successful at the mobile number listed in EMMI alert  Patient is able to verify HIPAA [facility] Care Management RN reviewed and addressed red alert with patient Breanna [place] RN CM that the answer to the emmi question was correct as yes She informs Cm that she does have a hx of depression and has had some depression related to her new stroke dx. She reports she did speak already with her primary care MD, Dr Tamala Julian about her depression. She is already on Celexa for Depression and she and her MD have agree to increase the dosage at a later time if the depression continues.   Social: Breanna Perry lives alone and has support of family and friends She is independent with her care needs and transportation to medical appointments    Conditions: Acute right arterial middle cerebral artery ischemic stroke s/p tPA, HTN, sjogren's syndrome, bilateral sensorineural hearing loss, alveolar pneumopathy, impacted cerumen of both ears.=,HDL, obesity, bilateral tinnitus   Medications: denies concerns with taking medications as prescribed, affording medications, side effects of medications and questions about medications    Appointments: has seen primary md-10/07/18 , being worked up for afib and to see neurology    Advance Directives: Denies need for assist with advance directives     Consent: [facility] RN CM reviewed [facility] services with patient. Patient gave verbal consent for services.   Advised patient that there will be further automated EMMI- post discharge calls to assess how the patient is doing following the recent hospitalization Advised the patient that  another call may be received from a nurse if any of their responses were abnormal. Patient voiced understanding and was appreciative of f/u call.   Plan: [facility] RN CM will close case at this time as patient has been assessed and no needs identified.   [facility] RN CM sent a successful outreach letter as discussed with [facility] brochure enclosed for review  Pt encouraged to return a call to [facility] RN CM prn   Kimberly L. Lavina Hamman, RN, BSN, [place] Coordinator Office number [phone] Mobile number [phone]  Main [facility] number [phone] Fax number [phone]

## 2018-10-17 ENCOUNTER — Other Ambulatory Visit: Payer: Self-pay | Admitting: *Deleted

## 2018-10-17 DIAGNOSIS — E785 Hyperlipidemia, unspecified: Secondary | ICD-10-CM | POA: Diagnosis not present

## 2018-10-17 DIAGNOSIS — R0683 Snoring: Secondary | ICD-10-CM | POA: Diagnosis not present

## 2018-10-17 DIAGNOSIS — R7303 Prediabetes: Secondary | ICD-10-CM | POA: Diagnosis not present

## 2018-10-17 DIAGNOSIS — I10 Essential (primary) hypertension: Secondary | ICD-10-CM | POA: Diagnosis not present

## 2018-10-17 NOTE — Patient Outreach (Signed)
[place] [facility]) Care Management  10/17/2018  Breanna Perry [birth year] [id number]   EMMI- stroke  RED ON EMMI ALERT Day # 13 Date: 10/16/2018 Thursday 1005  Red Alert Reason: [name] to follow-up appointment? No Scheduled a follow-up appointment?  No  Insurance: Blue cross and blue shield  [facility] admissions  ED visits in the last 6 months    Outreach attempt # 1 unsuccessful at the mobile number listed in EMMI alert   No answer. [facility] RN CM left HIPAA compliant voicemail message along with CM's contact info.    Conditions: Acute right arterial middle cerebral artery ischemic stroke s/p tPA, HTN, sjogren's syndrome, bilateral sensorineural hearing loss, alveolar pneumopathy, impacted cerumen of both ears.=,HDL, obesity, bilateral tinnitus  Plan: [name] RN CM sent an unsuccessful outreach letter and scheduled this patient for another call attempt within 4 business days  Lorieann Argueta L. Lavina Hamman, RN, BSN, [place] Coordinator Office number [phone] Mobile number [phone]  Main [facility] number [phone] Fax number [phone]

## 2018-10-20 ENCOUNTER — Other Ambulatory Visit: Payer: BLUE CROSS/BLUE SHIELD | Admitting: *Deleted

## 2018-10-20 DIAGNOSIS — Z8673 Personal history of transient ischemic attack (TIA), and cerebral infarction without residual deficits: Secondary | ICD-10-CM | POA: Diagnosis not present

## 2018-10-20 DIAGNOSIS — H04123 Dry eye syndrome of bilateral lacrimal glands: Secondary | ICD-10-CM | POA: Diagnosis not present

## 2018-10-20 DIAGNOSIS — E785 Hyperlipidemia, unspecified: Secondary | ICD-10-CM | POA: Diagnosis not present

## 2018-10-20 DIAGNOSIS — M3500 Sicca syndrome, unspecified: Secondary | ICD-10-CM | POA: Diagnosis not present

## 2018-10-20 DIAGNOSIS — H2513 Age-related nuclear cataract, bilateral: Secondary | ICD-10-CM | POA: Diagnosis not present

## 2018-10-20 NOTE — Patient Outreach (Signed)
[place] [facility]) Care Management  10/20/2018  Seher Schlagel [birth year] [id number]   EMMI- stroke  RED ON EMMI ALERT Day #13 Date:10/16/2018 Thursday 1005 Red Alert Reason:Went to follow-up appointment? No Scheduled a follow-up appointment?  No  Insurance:Blue cross and blue shield [facility] ED visits in the last 6 months    Outreach attempt #2 unsuccessful at the mobile number listed in EMMI alert  No answer. [facility] RN CM left HIPAA compliant voicemail message along with CM's contact info.    Conditions:Acute right arterial middle cerebral artery ischemic stroke s/p tPA, HTN, sjogren's syndrome, bilateral sensorineural hearing loss, alveolar pneumopathy, impacted cerumen of both ears.=,HDL, obesity, bilateral tinnitus  Plan: [facility] RN CM scheduled this patient for a final call attempt within 4 business days  Kimberly L. Lavina Hamman, RN, BSN, [place] Coordinator Office number [phone] Mobile number [phone]  Main [facility] number [phone] Fax number [phone]

## 2018-10-20 NOTE — Patient Outreach (Signed)
[place] [facility]) Care Management  10/20/2018  Jim Philemon June 09, 1956 [id number]   EMMI- stroke  RED ON EMMI ALERT Day #13 Date:10/16/2018 Thursday1005 Red Alert Reason:Went to follow-up appointment? No Scheduled a follow-up appointment? No  Insurance:Blue cross and blue shield [facility] ED visits in the last 6 months    Patient returned a call to [facility] RN CM Patient is able to verify HIPAA Reviewed and addressed EMMI red alert/referral to [facility] with patient She clarifies that the 10/16/2018 answers were correct at the time of the call but after the call she made clarification of her TEE and neurology appointments She confirms she now has all of her appointments scheduled She is to be seen by neurology on 11/11/17, ws seen by primary on 10/07/18  and she has a TEE appointment on 10/30/2018 She denies any other medical concerns at this time or need of [facility] services She states she is able to complete tasks without assist from [place] right arterial middle cerebral artery ischemic stroke s/p tPA, HTN, sjogren's syndrome, bilateral sensorineural hearing loss, alveolar pneumopathy, impacted cerumen of both ears.=,HDL, obesity, bilateral tinnitus  Plan: [facility] RN CM will close case at this time as patient has been assessed and no needs identified/needs resolved.   Pt encouraged to return a call to [facility] RN CM prn   Haron Beilke L. Lavina Hamman, RN, BSN, [place] Coordinator Office number [phone] Mobile number [phone]  Main [facility] number [phone] Fax number [phone]

## 2018-10-21 ENCOUNTER — Ambulatory Visit (HOSPITAL_COMMUNITY)
Admission: RE | Admit: 2018-10-21 | Discharge: 2018-10-21 | Disposition: A | Discharge status: Home / Self Care | Payer: BLUE CROSS/BLUE SHIELD | Attending: Internal Medicine | Admitting: Internal Medicine

## 2018-10-21 ENCOUNTER — Encounter (HOSPITAL_COMMUNITY)
Admission: RE | Disposition: A | Discharge status: Home / Self Care | Payer: Self-pay | Source: Home / Self Care | Attending: Internal Medicine

## 2018-10-21 ENCOUNTER — Other Ambulatory Visit: Payer: Self-pay

## 2018-10-21 ENCOUNTER — Ambulatory Visit (HOSPITAL_COMMUNITY): Payer: BLUE CROSS/BLUE SHIELD | Admitting: Anesthesiology

## 2018-10-21 ENCOUNTER — Ambulatory Visit (HOSPITAL_BASED_OUTPATIENT_CLINIC_OR_DEPARTMENT_OTHER)
Admission: RE | Admit: 2018-10-21 | Discharge: 2018-10-21 | Disposition: A | Discharge status: Home / Self Care | Payer: BLUE CROSS/BLUE SHIELD | Source: Home / Self Care | Attending: Internal Medicine | Admitting: Internal Medicine

## 2018-10-21 ENCOUNTER — Encounter (HOSPITAL_COMMUNITY): Payer: Self-pay | Admitting: *Deleted

## 2018-10-21 DIAGNOSIS — Z87891 Personal history of nicotine dependence: Secondary | ICD-10-CM | POA: Insufficient documentation

## 2018-10-21 DIAGNOSIS — I10 Essential (primary) hypertension: Secondary | ICD-10-CM | POA: Diagnosis not present

## 2018-10-21 DIAGNOSIS — I6389 Other cerebral infarction: Secondary | ICD-10-CM

## 2018-10-21 DIAGNOSIS — Z8249 Family history of ischemic heart disease and other diseases of the circulatory system: Secondary | ICD-10-CM | POA: Insufficient documentation

## 2018-10-21 DIAGNOSIS — Z79899 Other long term (current) drug therapy: Secondary | ICD-10-CM | POA: Diagnosis not present

## 2018-10-21 DIAGNOSIS — I639 Cerebral infarction, unspecified: Secondary | ICD-10-CM | POA: Insufficient documentation

## 2018-10-21 DIAGNOSIS — Z7982 Long term (current) use of aspirin: Secondary | ICD-10-CM | POA: Insufficient documentation

## 2018-10-21 DIAGNOSIS — I700 Atherosclerosis of aorta: Secondary | ICD-10-CM | POA: Insufficient documentation

## 2018-10-21 DIAGNOSIS — M3500 Sicca syndrome, unspecified: Secondary | ICD-10-CM | POA: Insufficient documentation

## 2018-10-21 DIAGNOSIS — I63511 Cerebral infarction due to unspecified occlusion or stenosis of right middle cerebral artery: Secondary | ICD-10-CM | POA: Diagnosis present

## 2018-10-21 DIAGNOSIS — Z90711 Acquired absence of uterus with remaining cervical stump: Secondary | ICD-10-CM | POA: Insufficient documentation

## 2018-10-21 DIAGNOSIS — I081 Rheumatic disorders of both mitral and tricuspid valves: Secondary | ICD-10-CM | POA: Diagnosis not present

## 2018-10-21 DIAGNOSIS — E785 Hyperlipidemia, unspecified: Secondary | ICD-10-CM | POA: Diagnosis not present

## 2018-10-21 HISTORY — PX: LOOP RECORDER INSERTION: EP1214

## 2018-10-21 HISTORY — PX: TEE WITHOUT CARDIOVERSION: SHX5443

## 2018-10-21 SURGERY — LOOP RECORDER INSERTION

## 2018-10-21 SURGERY — ECHOCARDIOGRAM, TRANSESOPHAGEAL
Anesthesia: Monitor Anesthesia Care

## 2018-10-21 MED ORDER — SODIUM CHLORIDE 0.9 % IV SOLN: INTRAVENOUS | Status: DC

## 2018-10-21 MED ORDER — PROPOFOL 500 MG/50ML IV EMUL
INTRAVENOUS | Status: DC | PRN
  Administered 2018-10-21: 150 ug/kg/min via INTRAVENOUS

## 2018-10-21 MED ORDER — LIDOCAINE 2% (20 MG/ML) 5 ML SYRINGE
INTRAMUSCULAR | Status: DC | PRN
  Administered 2018-10-21 (×2): 50 mg via INTRAVENOUS

## 2018-10-21 MED ORDER — LACTATED RINGERS IV SOLN
INTRAVENOUS | Status: DC | PRN
  Administered 2018-10-21: 13:00:00 via INTRAVENOUS

## 2018-10-21 MED ORDER — LIDOCAINE-EPINEPHRINE 1 %-1:100000 IJ SOLN
INTRAMUSCULAR | Status: AC
  Filled 2018-10-21: qty 1

## 2018-10-21 MED ORDER — LIDOCAINE-EPINEPHRINE 1 %-1:100000 IJ SOLN
INTRAMUSCULAR | Status: DC | PRN
  Administered 2018-10-21: 30 mL

## 2018-10-21 SURGICAL SUPPLY — 2 items
LOOP REVEAL LINQSYS (Prosthesis & Implant Heart) ×2 IMPLANT
PACK LOOP INSERTION (CUSTOM PROCEDURE TRAY) ×3 IMPLANT

## 2018-10-21 NOTE — Progress Notes (Signed)
  Echocardiogram Echocardiogram Transesophageal has been performed.  Breanna Perry 10/21/2018, 2:21 PM

## 2018-10-21 NOTE — Interval H&P Note (Signed)
History and Physical Interval Note:  10/21/2018 2:11 PM  Renu Asby  has presented today for surgery, with the diagnosis of a fib  The various methods of treatment have been discussed with the patient and family. After consideration of risks, benefits and other options for treatment, the patient has consented to  Procedure(s): LOOP RECORDER INSERTION (N/A) as a surgical intervention .  The patient's history has been reviewed, patient examined, no change in status, stable for surgery.  I have reviewed the patient's chart and labs.  Questions were answered to the patient's satisfaction.     Thompson Grayer

## 2018-10-21 NOTE — Anesthesia Postprocedure Evaluation (Signed)
Anesthesia Post Note  Patient: Breanna Perry  Procedure(s) Performed: TRANSESOPHAGEAL ECHOCARDIOGRAM (TEE) (N/A ) BUBBLE STUDY LOOP RECORDER IMPLANT     Patient location during evaluation: PACU Anesthesia Type: MAC Level of consciousness: awake and alert Pain management: pain level controlled Vital Signs Assessment: post-procedure vital signs reviewed and stable Respiratory status: spontaneous breathing, nonlabored ventilation, respiratory function stable and patient connected to nasal cannula oxygen Cardiovascular status: stable and blood pressure returned to baseline Postop Assessment: no apparent nausea or vomiting Anesthetic complications: no    Last Vitals:  Vitals:   10/21/18 1420 10/21/18 1430  BP: (!) 108/53 (!) 116/58  Pulse: 67 70  Resp: (!) 22 (!) 21  Temp:    SpO2: 100% 99%    Last Pain:  Vitals:   10/21/18 1430  TempSrc:   PainSc: 0-No pain                 Sumaya Riedesel

## 2018-10-21 NOTE — Anesthesia Preprocedure Evaluation (Signed)
Anesthesia Evaluation  Patient identified by MRN, date of birth, ID band Patient awake    Reviewed: Allergy & Precautions, H&P , NPO status , Patient's Chart, lab work & pertinent test results, reviewed documented beta blocker date and time   Airway Mallampati: III  TM Distance: >3 FB Neck ROM: Full    Dental no notable dental hx. (+) Teeth Intact, Dental Advisory Given   Pulmonary neg pulmonary ROS, former smoker,    Pulmonary exam normal breath sounds clear to auscultation       Cardiovascular hypertension, On Medications and On Home Beta Blockers  Rhythm:Regular Rate:Normal     Neuro/Psych PSYCHIATRIC DISORDERS Anxiety Depression    GI/Hepatic negative GI ROS, Neg liver ROS,   Endo/Other  negative endocrine ROS  Renal/GU negative Renal ROS  negative genitourinary   Musculoskeletal   Abdominal   Peds  Hematology negative hematology ROS (+)   Anesthesia Other Findings Alveolar pneumopathy  Acute right arterial ischemic stroke, middle cerebral artery   s/p tPA HTN (hypertension) Hyperlipidemia LDL goal <70 Sjogren's syndrome (HCC) Obesity Tinnitus, bilateral Sensorineural hearing loss (SNHL), bilateral    Reproductive/Obstetrics negative OB ROS                            Anesthesia Physical  Anesthesia Plan  ASA: III  Anesthesia Plan: MAC   Post-op Pain Management:    Induction: Intravenous  PONV Risk Score and Plan: 2 and Ondansetron, Treatment may vary due to age or medical condition and Dexamethasone  Airway Management Planned: Nasal Cannula, Simple Face Mask and Mask  Additional Equipment:   Intra-op Plan:   Post-operative Plan: Extubation in OR  Informed Consent: I have reviewed the patients History and Physical, chart, labs and discussed the procedure including the risks, benefits and alternatives for the proposed anesthesia with the patient or authorized  representative who has indicated his/her understanding and acceptance.     Dental advisory given  Plan Discussed with: CRNA, Anesthesiologist and Surgeon  Anesthesia Plan Comments:        Anesthesia Quick Evaluation

## 2018-10-21 NOTE — Interval H&P Note (Signed)
History and Physical Interval Note:  10/21/2018 1:04 PM  Breanna Perry  has presented today for surgery, with the diagnosis of stroke  The various methods of treatment have been discussed with the patient and family. After consideration of risks, benefits and other options for treatment, the patient has consented to  Procedure(s) with comments: TRANSESOPHAGEAL ECHOCARDIOGRAM (TEE) (N/A) - possible loop recorder as a surgical intervention .  The patient's history has been reviewed, patient examined, no change in status, stable for surgery.  I have reviewed the patient's chart and labs.  Questions were answered to the patient's satisfaction.     Elouise Munroe

## 2018-10-21 NOTE — Transfer of Care (Signed)
Immediate Anesthesia Transfer of Care Note  Patient: Breanna Perry  Procedure(s) Performed: TRANSESOPHAGEAL ECHOCARDIOGRAM (TEE) (N/A )  Patient Location: PACU  Anesthesia Type:MAC  Level of Consciousness: awake  Airway & Oxygen Therapy: Patient Spontanous Breathing and Patient connected to nasal cannula oxygen  Post-op Assessment: Report given to RN and Post -op Vital signs reviewed and stable  Post vital signs: Reviewed and stable  Last Vitals:  Vitals Value Taken Time  BP    Temp    Pulse    Resp    SpO2      Last Pain:  Vitals:   10/21/18 1406  TempSrc: Oral  PainSc:          Complications: No apparent anesthesia complications

## 2018-10-21 NOTE — CV Procedure (Signed)
INDICATIONS: stroke  PROCEDURE:   Informed consent was obtained prior to the procedure. The risks, benefits and alternatives for the procedure were discussed and the patient comprehended these risks.  Risks include, but are not limited to, cough, sore throat, vomiting, nausea, somnolence, esophageal and stomach trauma or perforation, bleeding, low blood pressure, aspiration, pneumonia, infection, trauma to the teeth and death.    After a procedural time-out, the oropharynx was anesthetized with 20% benzocaine spray.   During this procedure the patient was administered propofol to achieve and maintain moderate conscious sedation.  The patient's heart rate, blood pressure, and oxygen saturationweare monitored continuously during the procedure. The period of conscious sedation was 30 minutes, of which I was present face-to-face 100% of this time.  The transesophageal probe was inserted in the esophagus and stomach without difficulty and multiple views were obtained.  The patient was kept under observation until the patient left the procedure room.  The patient left the procedure room in stable condition.   Agitated microbubble saline contrast was administered.  COMPLICATIONS:    There were no immediate complications.  FINDINGS:  Normal biventricular function.  Mild tricuspid valve regurgitation No LAA clot No intracardiac mass or thrombus.    RECOMMENDATIONS:    Can proceed to loop implant per EP.  Time Spent Directly with the Patient:  45 minutes   Nechemia Chiappetta A Margaretann Loveless 10/21/2018, 2:10 PM

## 2018-10-21 NOTE — Discharge Instructions (Signed)

## 2018-10-22 ENCOUNTER — Ambulatory Visit: Payer: BLUE CROSS/BLUE SHIELD | Admitting: *Deleted

## 2018-10-22 ENCOUNTER — Encounter (HOSPITAL_COMMUNITY): Payer: Self-pay | Admitting: Internal Medicine

## 2018-10-22 NOTE — Addendum Note (Signed)
Addendum  created 10/22/18 1119 by Janeece Riggers, MD   [place] recorded in [place], [place] filed

## 2018-10-30 ENCOUNTER — Ambulatory Visit (INDEPENDENT_AMBULATORY_CARE_PROVIDER_SITE_OTHER): Payer: BLUE CROSS/BLUE SHIELD | Admitting: Nurse Practitioner

## 2018-10-30 DIAGNOSIS — I639 Cerebral infarction, unspecified: Secondary | ICD-10-CM

## 2018-10-30 LAB — CUP PACEART INCLINIC DEVICE CHECK
Date Time Interrogation Session: 20200123090621
Implantable Pulse Generator Implant Date: 20200114

## 2018-10-30 NOTE — Progress Notes (Signed)
LINQ wound check. Wound well healed. Device transmitting. Questions answered.

## 2018-11-10 DIAGNOSIS — H524 Presbyopia: Secondary | ICD-10-CM | POA: Diagnosis not present

## 2018-11-11 ENCOUNTER — Ambulatory Visit (INDEPENDENT_AMBULATORY_CARE_PROVIDER_SITE_OTHER): Payer: BLUE CROSS/BLUE SHIELD | Admitting: Adult Health

## 2018-11-11 ENCOUNTER — Encounter: Payer: Self-pay | Admitting: Adult Health

## 2018-11-11 VITALS — BP 107/72 | HR 60 | Ht 64.0 in | Wt 189.4 lb

## 2018-11-11 DIAGNOSIS — I63411 Cerebral infarction due to embolism of right middle cerebral artery: Secondary | ICD-10-CM

## 2018-11-11 DIAGNOSIS — I10 Essential (primary) hypertension: Secondary | ICD-10-CM | POA: Diagnosis not present

## 2018-11-11 DIAGNOSIS — Z95818 Presence of other cardiac implants and grafts: Secondary | ICD-10-CM

## 2018-11-11 DIAGNOSIS — E785 Hyperlipidemia, unspecified: Secondary | ICD-10-CM | POA: Diagnosis not present

## 2018-11-11 NOTE — Progress Notes (Signed)
[facility] [address] [place]. [place] [address] [phone]       OFFICE FOLLOW UP NOTE  [name] Date of Birth:  11/19/1955 Medical Record Number:  [id number]   Reason for Referral:  hospital stroke follow up  CHIEF COMPLAINT:  Chief Complaint  Patient presents with  . Follow-up    Hospital stroke follow up pr saw Dr.Ahern in the hospital room in back hallway pt alone    HPI: Breanna Perry is being seen today for initial visit in the office for scattered right MCA infarcts embolic pattern secondary to unknown source s/p IV TPA on 09/29/2018. History obtained from patient and chart review. Reviewed all radiology images and labs personally.  Breanna Perry a 62 y.o.femalewith history of tinnitus, hypertension, hyperlipidemia and Sjogren's disease who presented with sudden onset headache and coworkers noticing left-sided facial droop.  Upon initial ED evaluation, she was noted to have left facial droop, significant dysarthria and decreased sensation of the left arm.  CT head obtained and was negative for acute infarct or hemorrhage.  CTA head and neck showed likely right M2 branch occlusion.  NIHSS 4.  IV TPA administered due to ongoing deficits without complication and stable repeat CT head with possible punctate hyperdensity right MCA bifurcation.  MRI head reviewed and showed scattered right MCA territory infarcts.  2D echo showed an EF of 55 to 60% without cardiac source embolus identified.  LDL 99 and A1c 5.8.  HIV testing negative.  Due to cryptogenic etiology, recommended outpatient TEE with possible loop recorder placement to rule out atrial fibrillation (unable to perform during admission due to Christmas holiday).  HTN stable and recommended resuming home antihypertensives upon discharge.  No antithrombotic PTA and recommended initiate aspirin 81 mg daily.  Previously on Zetia 10 mg for HLD management and due to no intolerance for statins  identified, initiate statin with discontinuation of Zetia.  Other stroke risk factors include prior tobacco use and obesity.  She was discharged home in stable condition without therapy needs.  Breanna Perry is being seen today for hospital follow-up.  She did undergo TEE on 10/21/2018 which did not show evidence of PFO or cardiac source of embolus therefore loop recorder placed.  Loop recorder has not shown atrial fibrillation thus far.  She continues on aspirin 81 mg without side effects of bleeding or bruising.  Continues on atorvastatin without side effects myalgias.  Blood pressure today 107/72. She believes she has returned to baseline without any residual deficits or reoccurring of symptoms. She has returned to work without complications along with returning to all prior activities.  She does report occasional dizziness episodes but was experiencing this prior to her stroke.  Denies new or worsening stroke/TIA symptoms.   ROS:   14 system review of systems performed and negative with exception of ringing in ears, dizziness, depression, anxiety and racing thoughts  PMH:  Past Medical History:  Diagnosis Date  . Anxiety   . Dental bridge present    upper and lower  . Depression   . Hypertension    under control with med., has been on med. x 8 yr.  . Medial meniscus tear 12/2012   left  . Sjogren's syndrome ([place])   . Stroke [facility])     PSH:  Past Surgical History:  Procedure Laterality Date  . APPENDECTOMY  04/1991  . KNEE ARTHROSCOPY Left 01/07/2013   Procedure: LEFT KNEE ARTHROSCOPY PARTIAL MEDIAL MENISECTOMY CHRONDOPLASTY MEDIAL PLICA RESECTION;  Surgeon: Karen Chafe  Berenice Primas, MD;  Location: [facility];  Service: Orthopedics;  Laterality: Left;  . KNEE SURGERY  06/22/2015  . LIVER BIOPSY  summer 2011  . LOOP RECORDER INSERTION N/A 10/21/2018   Procedure: LOOP RECORDER INSERTION;  Surgeon: Thompson Grayer, MD;  Location: [facility];  Service: Cardiovascular;  Laterality: N/A;   . LUNG BIOPSY  summer 2011  . PARTIAL HYSTERECTOMY  04/1991   partial  . TEE WITHOUT CARDIOVERSION N/A 10/21/2018   Procedure: TRANSESOPHAGEAL ECHOCARDIOGRAM (TEE);  Surgeon: Elouise Munroe, MD;  Location: [facility];  Service: Cardiology;  Laterality: N/A;  possible loop recorder    Social History:  Social History   Socioeconomic History  . Marital status: Single    Spouse name: Not on file  . Number of children: N  . Years of education: Not on file  . Highest education level: Not on file  Occupational History  . Occupation: parts Nurse, mental health   Social Needs  . Financial resource strain: Not on file  . Food insecurity:    Worry: Not on file    Inability: Not on file  . Transportation needs:    Medical: Not on file    Non-medical: Not on file  Tobacco Use  . Smoking status: Former Smoker    Packs/day: 1.00    Years: 31.00    Pack years: 31.00    Types: Cigarettes    Last attempt to quit: 10/08/2000    Years since quitting: 18.1  . Smokeless tobacco: Never Used  Substance and Sexual Activity  . Alcohol use: Yes    Comment: rarely  . Drug use: No  . Sexual activity: Not on file  Lifestyle  . Physical activity:    Days per week: Not on file    Minutes per session: Not on file  . Stress: Not on file  Relationships  . Social connections:    Talks on phone: Not on file    Gets together: Not on file    Attends religious service: Not on file    Active member of club or organization: Not on file    Attends meetings of clubs or organizations: Not on file    Relationship status: Not on file  . Intimate partner violence:    Fear of current or ex partner: Not on file    Emotionally abused: Not on file    Physically abused: Not on file    Forced sexual activity: Not on file  Other Topics Concern  . Not on file  Social History Narrative  . Not on file    Family History:  Family History  Problem Relation Age of Onset  . Hypertension Father   .  Emphysema Father   . Heart attack Father   . Cirrhosis Father   . Coronary artery disease Mother   . Heart failure Mother   . COPD Mother   . Breast cancer Maternal Grandmother   . HIV Brother   . Diabetes Sister     Medications:   Current Outpatient Medications on File Prior to Visit  Medication Sig Dispense Refill  . ARIPiprazole (ABILIFY) 2 MG tablet Take 2 mg by mouth daily.    Marland Kitchen aspirin EC 81 MG EC tablet Take 1 tablet (81 mg total) by mouth daily.    Marland Kitchen atorvastatin (LIPITOR) 40 MG tablet Take 1 tablet (40 mg total) by mouth daily at 6 PM. (Patient taking differently: Take 40 mg by mouth daily. ) 30 tablet 2  .  Cholecalciferol (VITAMIN D3 PO) Take 50,000 Units by mouth once a week.    . citalopram (CELEXA) 40 MG tablet Take 40 mg by mouth daily.   1  . colesevelam (WELCHOL) 625 MG tablet Take 1,875 mg by mouth 2 (two) times daily.    . metoprolol (TOPROL-XL) 50 MG 24 hr tablet Take 50 mg by mouth daily.     . Olmesartan-Amlodipine-HCTZ (TRIBENZOR) 40-10-25 MG TABS Take 1 tablet by mouth daily.      No current facility-administered medications on file prior to visit.     Allergies:  No Known Allergies   Physical Exam  Vitals:   11/11/18 0943  BP: 107/72  Pulse: 60  Weight: 189 lb 6.4 oz (85.9 kg)  Height: 5\' 4"  (1.626 m)   Body mass index is 32.51 kg/m. No exam data present  General: well developed, well nourished, pleasant middle-aged African-American female, seated, in no evident distress Head: head normocephalic and atraumatic.   Neck: supple with no carotid or supraclavicular bruits Cardiovascular: regular rate and rhythm, no murmurs Musculoskeletal: no deformity Skin:  no rash/petichiae Vascular:  Normal pulses all extremities  Neurologic Exam Mental Status: Awake and fully alert. Oriented to place and time. Recent and remote memory intact. Attention span, concentration and fund of knowledge appropriate. Mood and affect appropriate.  Cranial Nerves:  Fundoscopic exam reveals sharp disc margins. Pupils equal, briskly reactive to light. Extraocular movements full without nystagmus. Visual fields full to confrontation. Hearing intact. Facial sensation intact. Face, tongue, palate moves normally and symmetrically.  Motor: Normal bulk and tone. Normal strength in all tested extremity muscles. Sensory.: intact to touch , pinprick , position and vibratory sensation.  Coordination: Rapid alternating movements normal in all extremities. Finger-to-nose and heel-to-shin performed accurately bilaterally. Gait and Station: Arises from chair without difficulty. Stance is normal. Gait demonstrates normal stride length and balance. Able to heel, toe and tandem walk without difficulty.  Reflexes: 1+ and symmetric. Toes downgoing.    NIHSS  0 Modified Rankin  0    Diagnostic Data (Labs, Imaging, Testing)  CT HEAD WO CONTRAST 09/29/2018 IMPRESSION: 1. Negative CT head 2. ASPECTS is 10  CT ANGIO HEAD W OR WO CONTRAST CT ANGIO NECK W OR WO CONTRAST 09/29/2018 IMPRESSION: Focal stenosis right MCA bifurcation compatible with embolus. Possible branch occlusion right M2. No significant carotid or vertebral stenosis in the neck.  MR BRAIN WO CONTRAST 09/30/2018 IMPRESSION: Scattered areas of acute infarction in the right MCA territory as outlined above. The largest region affects the deep insula. Minimal petechial blood products in that location. No frank hematoma.  ECHOCARDIOGRAM 09/29/2018 Study Conclusions - Left ventricle: The cavity size was normal. Wall thickness was   normal. Systolic function was normal. The estimated ejection   fraction was in the range of 55% to 60%. Wall motion was normal;   there were no regional wall motion abnormalities. Left   ventricular diastolic function parameters were normal. - Left atrium: The atrium was mildly dilated. - Atrial septum: No defect or patent foramen ovale was identified.  ECHO  TEE 10/21/2018 Study Conclusions - Left ventricle: Systolic function was normal. The estimated   ejection fraction was in the range of 60% to 65%. Wall motion was   normal; there were no regional wall motion abnormalities. - Aorta: There was mild atheromatous plaque. - Mitral valve: There was trivial regurgitation. - Left atrium: No evidence of thrombus in the atrial cavity or   appendage. - Right ventricle: The cavity size was  normal. Wall thickness was   normal. Systolic function was normal. - Right atrium: The atrium was normal in size. - Atrial septum: Aneurysmal atrial septum with significant   mobility. No evidence of atrial level shunt by color flow Doppler   or agitated saline contrast injection. - Tricuspid valve: There was trivial regurgitation. - Pericardium, extracardiac: There was no pericardial effusion. Impressions: - No cardiac source of emboli was identified.  ILR placement 10/21/2018     ASSESSMENT: Breanna Perry is a 63 y.o. year old female here with scattered right MCA infarcts embolic pattern on 03/50/0938 secondary to undetermined source.  TEE negative on 10/21/2018 therefore loop recorder placed.  Vascular risk factors include HTN, HLD, former tobacco use and obesity.  She returns today for hospital follow-up and overall doing well without residual deficits or recurring symptoms.    PLAN:  1. Right MCA infarcts: Continue aspirin 81 mg daily  and atorvastatin 40 mg for secondary stroke prevention. Maintain strict control of hypertension with blood pressure goal below 130/90, diabetes with hemoglobin A1c goal below 6.5% and cholesterol with LDL cholesterol (bad cholesterol) goal below 70 mg/dL.  I also advised the patient to eat a healthy diet with plenty of whole grains, cereals, fruits and vegetables, exercise regularly with at least 30 minutes of continuous activity daily and maintain ideal body weight. 2. HTN: Advised to continue current treatment regimen.   Today's BP 107/72.  Advised to start to monitor BP at home along with dizziness episodes and to continue to follow with PCP for further management 3. HLD: Advised to continue current treatment regimen along with continued follow-up with PCP for future prescribing and monitoring of lipid panel 4. Loop recorder placement: Continue to monitor for atrial fibrillation 5. Interested in [place] participation -additional information was provided by [facility] research team    Follow up in 6 months or call earlier if needed   Greater than 50% of time during this 25 minute visit was spent on counseling, explanation of diagnosis of right MCA infarcts, reviewing risk factor management of HTN and HLD, planning of further management along with potential future management, and discussion with patient and family answering all questions.    Venancio Poisson, AGNP-BC  [facility] [address] [place], [place] [address]  Phone [phone] Fax [phone] Note: This document was prepared with digital dictation and possible smart phrase technology. Any transcriptional errors that result from this process are unintentional.

## 2018-11-11 NOTE — Patient Instructions (Signed)
Continue aspirin 81 mg daily  and lipitor  for secondary stroke prevention  Continue to follow up with PCP regarding cholesterol and blood pressure management   Consider participation in [employer] trial - information provided today  We will continue to monitor loop recorder for atrial fibrillation  Continue to monitor blood pressure at home  Maintain strict control of hypertension with blood pressure goal below 130/90, diabetes with hemoglobin A1c goal below 6.5% and cholesterol with LDL cholesterol (bad cholesterol) goal below 70 mg/dL. I also advised the patient to eat a healthy diet with plenty of whole grains, cereals, fruits and vegetables, exercise regularly and maintain ideal body weight.  Followup in the future with me in 6 months or call earlier if needed       Thank you for coming to see [place] at [facility]. I hope we have been able to provide you high quality care today.  You may receive a patient satisfaction survey over the next few weeks. We would appreciate your feedback and comments so that we may continue to improve ourselves and the health of our patients.

## 2018-11-12 NOTE — Progress Notes (Signed)
I agree with the above plan 

## 2018-11-24 ENCOUNTER — Ambulatory Visit (INDEPENDENT_AMBULATORY_CARE_PROVIDER_SITE_OTHER): Payer: BLUE CROSS/BLUE SHIELD

## 2018-11-24 DIAGNOSIS — I639 Cerebral infarction, unspecified: Secondary | ICD-10-CM | POA: Diagnosis not present

## 2018-11-24 LAB — CUP PACEART REMOTE DEVICE CHECK
Date Time Interrogation Session: 20200216194130
MDC IDC PG IMPLANT DT: 20200114

## 2018-11-28 DIAGNOSIS — E669 Obesity, unspecified: Secondary | ICD-10-CM | POA: Diagnosis not present

## 2018-11-28 DIAGNOSIS — Z6833 Body mass index (BMI) 33.0-33.9, adult: Secondary | ICD-10-CM | POA: Diagnosis not present

## 2018-11-28 DIAGNOSIS — Z713 Dietary counseling and surveillance: Secondary | ICD-10-CM | POA: Diagnosis not present

## 2018-12-03 NOTE — Progress Notes (Signed)
[facility] Summary Report / Loop Recorder 

## 2018-12-04 DIAGNOSIS — E785 Hyperlipidemia, unspecified: Secondary | ICD-10-CM | POA: Diagnosis not present

## 2018-12-17 DIAGNOSIS — Z90710 Acquired absence of both cervix and uterus: Secondary | ICD-10-CM | POA: Diagnosis not present

## 2018-12-17 DIAGNOSIS — Z1231 Encounter for screening mammogram for malignant neoplasm of breast: Secondary | ICD-10-CM | POA: Diagnosis not present

## 2018-12-17 DIAGNOSIS — Z803 Family history of malignant neoplasm of breast: Secondary | ICD-10-CM | POA: Diagnosis not present

## 2018-12-17 DIAGNOSIS — Z96652 Presence of left artificial knee joint: Secondary | ICD-10-CM | POA: Diagnosis not present

## 2018-12-17 DIAGNOSIS — M8589 Other specified disorders of bone density and structure, multiple sites: Secondary | ICD-10-CM | POA: Diagnosis not present

## 2018-12-22 ENCOUNTER — Other Ambulatory Visit: Payer: Self-pay

## 2018-12-22 NOTE — Patient Outreach (Signed)
First attempt to obtain mRs. No answer. Left message for return call.  

## 2018-12-24 ENCOUNTER — Other Ambulatory Visit: Payer: Self-pay

## 2018-12-24 NOTE — Patient Outreach (Signed)
Telephone outreach to patient to obtain mRs was successfully completed. mRs= 0. 

## 2018-12-26 ENCOUNTER — Other Ambulatory Visit: Payer: Self-pay

## 2018-12-26 ENCOUNTER — Ambulatory Visit (INDEPENDENT_AMBULATORY_CARE_PROVIDER_SITE_OTHER): Payer: BLUE CROSS/BLUE SHIELD | Admitting: *Deleted

## 2018-12-26 DIAGNOSIS — Z713 Dietary counseling and surveillance: Secondary | ICD-10-CM | POA: Diagnosis not present

## 2018-12-26 DIAGNOSIS — E669 Obesity, unspecified: Secondary | ICD-10-CM | POA: Diagnosis not present

## 2018-12-26 DIAGNOSIS — I639 Cerebral infarction, unspecified: Secondary | ICD-10-CM

## 2018-12-26 DIAGNOSIS — M11261 Other chondrocalcinosis, right knee: Secondary | ICD-10-CM | POA: Diagnosis not present

## 2018-12-26 DIAGNOSIS — Z6833 Body mass index (BMI) 33.0-33.9, adult: Secondary | ICD-10-CM | POA: Diagnosis not present

## 2018-12-26 DIAGNOSIS — M1711 Unilateral primary osteoarthritis, right knee: Secondary | ICD-10-CM | POA: Diagnosis not present

## 2018-12-27 LAB — CUP PACEART REMOTE DEVICE CHECK
Date Time Interrogation Session: 20200320204119
Implantable Pulse Generator Implant Date: 20200114

## 2019-01-02 NOTE — Progress Notes (Signed)
[facility] Summary Report / Loop Recorder 

## 2019-01-07 DIAGNOSIS — Z0000 Encounter for general adult medical examination without abnormal findings: Secondary | ICD-10-CM | POA: Diagnosis not present

## 2019-01-07 DIAGNOSIS — Z79899 Other long term (current) drug therapy: Secondary | ICD-10-CM | POA: Diagnosis not present

## 2019-01-07 DIAGNOSIS — F322 Major depressive disorder, single episode, severe without psychotic features: Secondary | ICD-10-CM | POA: Diagnosis not present

## 2019-01-07 DIAGNOSIS — E782 Mixed hyperlipidemia: Secondary | ICD-10-CM | POA: Diagnosis not present

## 2019-01-07 DIAGNOSIS — I10 Essential (primary) hypertension: Secondary | ICD-10-CM | POA: Diagnosis not present

## 2019-01-07 DIAGNOSIS — N183 Chronic kidney disease, stage 3 (moderate): Secondary | ICD-10-CM | POA: Diagnosis not present

## 2019-01-28 ENCOUNTER — Other Ambulatory Visit: Payer: Self-pay

## 2019-01-28 ENCOUNTER — Ambulatory Visit (INDEPENDENT_AMBULATORY_CARE_PROVIDER_SITE_OTHER): Payer: BLUE CROSS/BLUE SHIELD | Admitting: *Deleted

## 2019-01-28 DIAGNOSIS — I639 Cerebral infarction, unspecified: Secondary | ICD-10-CM

## 2019-01-29 LAB — CUP PACEART REMOTE DEVICE CHECK
Date Time Interrogation Session: 20200422204000
Implantable Pulse Generator Implant Date: 20200114

## 2019-02-05 NOTE — Progress Notes (Signed)
[facility] Summary Report / Loop Recorder 

## 2019-02-06 DIAGNOSIS — Z6833 Body mass index (BMI) 33.0-33.9, adult: Secondary | ICD-10-CM | POA: Diagnosis not present

## 2019-02-06 DIAGNOSIS — E669 Obesity, unspecified: Secondary | ICD-10-CM | POA: Diagnosis not present

## 2019-02-06 DIAGNOSIS — Z713 Dietary counseling and surveillance: Secondary | ICD-10-CM | POA: Diagnosis not present

## 2019-02-10 DIAGNOSIS — F411 Generalized anxiety disorder: Secondary | ICD-10-CM | POA: Diagnosis not present

## 2019-03-03 ENCOUNTER — Ambulatory Visit (INDEPENDENT_AMBULATORY_CARE_PROVIDER_SITE_OTHER): Payer: BLUE CROSS/BLUE SHIELD | Admitting: *Deleted

## 2019-03-03 DIAGNOSIS — F411 Generalized anxiety disorder: Secondary | ICD-10-CM | POA: Diagnosis not present

## 2019-03-03 DIAGNOSIS — I639 Cerebral infarction, unspecified: Secondary | ICD-10-CM

## 2019-03-04 LAB — CUP PACEART REMOTE DEVICE CHECK
Date Time Interrogation Session: 20200525203824
Implantable Pulse Generator Implant Date: 20200114

## 2019-03-09 DIAGNOSIS — Z6832 Body mass index (BMI) 32.0-32.9, adult: Secondary | ICD-10-CM | POA: Diagnosis not present

## 2019-03-09 DIAGNOSIS — E669 Obesity, unspecified: Secondary | ICD-10-CM | POA: Diagnosis not present

## 2019-03-09 DIAGNOSIS — Z713 Dietary counseling and surveillance: Secondary | ICD-10-CM | POA: Diagnosis not present

## 2019-03-13 NOTE — Progress Notes (Signed)
[facility] Summary Report / Loop Recorder 

## 2019-03-23 DIAGNOSIS — Z713 Dietary counseling and surveillance: Secondary | ICD-10-CM | POA: Diagnosis not present

## 2019-04-03 DIAGNOSIS — M11261 Other chondrocalcinosis, right knee: Secondary | ICD-10-CM | POA: Diagnosis not present

## 2019-04-03 DIAGNOSIS — M1711 Unilateral primary osteoarthritis, right knee: Secondary | ICD-10-CM | POA: Diagnosis not present

## 2019-04-05 LAB — CUP PACEART REMOTE DEVICE CHECK
Date Time Interrogation Session: 20200627221002
Implantable Pulse Generator Implant Date: 20200114

## 2019-04-06 ENCOUNTER — Ambulatory Visit (INDEPENDENT_AMBULATORY_CARE_PROVIDER_SITE_OTHER): Payer: BC Managed Care – PPO | Admitting: *Deleted

## 2019-04-06 DIAGNOSIS — I63511 Cerebral infarction due to unspecified occlusion or stenosis of right middle cerebral artery: Secondary | ICD-10-CM

## 2019-04-16 NOTE — Progress Notes (Signed)
[facility] Summary Report / Loop Recorder 

## 2019-04-22 DIAGNOSIS — E669 Obesity, unspecified: Secondary | ICD-10-CM | POA: Diagnosis not present

## 2019-04-22 DIAGNOSIS — Z713 Dietary counseling and surveillance: Secondary | ICD-10-CM | POA: Diagnosis not present

## 2019-04-22 DIAGNOSIS — Z6831 Body mass index (BMI) 31.0-31.9, adult: Secondary | ICD-10-CM | POA: Diagnosis not present

## 2019-05-07 ENCOUNTER — Ambulatory Visit (INDEPENDENT_AMBULATORY_CARE_PROVIDER_SITE_OTHER): Payer: BC Managed Care – PPO | Admitting: *Deleted

## 2019-05-07 DIAGNOSIS — I639 Cerebral infarction, unspecified: Secondary | ICD-10-CM

## 2019-05-08 LAB — CUP PACEART REMOTE DEVICE CHECK
Date Time Interrogation Session: 20200730224024
Implantable Pulse Generator Implant Date: 20200114

## 2019-05-14 ENCOUNTER — Ambulatory Visit (INDEPENDENT_AMBULATORY_CARE_PROVIDER_SITE_OTHER): Payer: BC Managed Care – PPO | Admitting: Adult Health

## 2019-05-14 ENCOUNTER — Encounter: Payer: Self-pay | Admitting: Adult Health

## 2019-05-14 ENCOUNTER — Other Ambulatory Visit: Payer: Self-pay

## 2019-05-14 VITALS — BP 113/71 | HR 59 | Temp 97.5°F | Ht 64.0 in | Wt 187.0 lb

## 2019-05-14 DIAGNOSIS — E785 Hyperlipidemia, unspecified: Secondary | ICD-10-CM

## 2019-05-14 DIAGNOSIS — I63411 Cerebral infarction due to embolism of right middle cerebral artery: Secondary | ICD-10-CM

## 2019-05-14 DIAGNOSIS — I10 Essential (primary) hypertension: Secondary | ICD-10-CM

## 2019-05-14 DIAGNOSIS — Z95818 Presence of other cardiac implants and grafts: Secondary | ICD-10-CM | POA: Diagnosis not present

## 2019-05-14 DIAGNOSIS — I69319 Unspecified symptoms and signs involving cognitive functions following cerebral infarction: Secondary | ICD-10-CM

## 2019-05-14 NOTE — Progress Notes (Signed)
[facility] [address] [place] [address] [phone]       OFFICE FOLLOW UP NOTE  Ms. [name] Date of Birth:  [birth year] Medical Record Number:  [id number]   Reason for Referral:  hospital stroke follow up  CHIEF COMPLAINT:  Chief Complaint  Patient presents with   Follow-up    6 mon f/u. Alone. Treatment room. Patient mentioned that she would like to know does her heart play a factor in why she had a stroke.     HPI:  Stroke admission 09/29/2018: Kharma Luckettis a 63 y.o.femalewith history of tinnitus, hypertension, hyperlipidemia and Sjogren's disease who presented with sudden onset headache and coworkers noticing left-sided facial droop.  Upon initial ED evaluation, she was noted to have left facial droop, significant dysarthria and decreased sensation of the left arm.  CT head obtained and was negative for acute infarct or hemorrhage.  CTA head and neck showed likely right M2 branch occlusion.  NIHSS 4.  IV TPA administered due to ongoing deficits without complication and stable repeat CT head with possible punctate hyperdensity right MCA bifurcation.  MRI head reviewed and showed scattered right MCA territory infarcts.  2D echo showed an EF of 55 to 60% without cardiac source embolus identified.  LDL 99 and A1c 5.8.  HIV testing negative.  Due to cryptogenic etiology, recommended outpatient TEE with possible loop recorder placement to rule out atrial fibrillation (unable to perform during admission due to Christmas holiday).  HTN stable and recommended resuming home antihypertensives upon discharge.  No antithrombotic PTA and recommended initiate aspirin 81 mg daily.  Previously on Zetia 10 mg for HLD management and due to no intolerance for statins identified, initiate statin with discontinuation of Zetia.  Other stroke risk factors include prior tobacco use and obesity.  She was discharged home in stable condition without therapy  needs.  Initial visit 11/11/2018: Ms. Wrobel is being seen today for hospital follow-up.  She did undergo TEE on 10/21/2018 which did not show evidence of PFO or cardiac source of embolus therefore loop recorder placed.  Loop recorder has not shown atrial fibrillation thus far.  She continues on aspirin 81 mg without side effects of bleeding or bruising.  Continues on atorvastatin without side effects myalgias.  Blood pressure today 107/72. She believes she has returned to baseline without any residual deficits or reoccurring of symptoms. She has returned to work without complications along with returning to all prior activities.  She does report occasional dizziness episodes but was experiencing this prior to her stroke.  Denies new or worsening stroke/TIA symptoms.  05/13/2017 update: Ms. Wardle is a 63 year old female who is being seen today for stroke follow-up.  She has concerns of cognition/memory loss which has been present since her stroke and believes it has been worsening since that time.  She has difficulty performing her job duties with complains of decreased concentration, difficulty remembering certain words or spelling of words.  Experiences worsening during increased stress or fatigue.  She does have underlying history of depression/anxiety and overall stressful home life.  She does continue to follow with psychiatry for ongoing management.  She also endorses nighttime head pains which consists of a sharp stabbing sensation lasting for a couple seconds and will experience the sensation in multiple areas on her head and will last for a total of around 5 minutes.  She has been experiencing this since her stroke and has overall been improving as they do not last as long.  These head pains will only be present when attempting to go to sleep.  She denies any other headache symptoms, headache history or migraine history.  She denies any other neurological deficit or worsening of symptoms.  Loop recorder  has not shown atrial fibrillation thus far.  Continues on aspirin 81 mg and atorvastatin for secondary stroke prevention without side effects.  Blood pressure today stable at 113/71.  No other concerns at this time.  Denies new or worsening stroke/TIA symptoms.      ROS:   14 system review of systems performed and negative with exception of headache, memory loss, depression anxiety  PMH:  Past Medical History:  Diagnosis Date   Anxiety    Dental bridge present    upper and lower   Depression    Hypertension    under control with med., has been on med. x 8 yr.   Medial meniscus tear 12/2012   left   Sjogren's syndrome ([place])    Stroke [facility])     PSH:  Past Surgical History:  Procedure Laterality Date   APPENDECTOMY  04/1991   KNEE ARTHROSCOPY Left 01/07/2013   Procedure: LEFT KNEE ARTHROSCOPY PARTIAL MEDIAL MENISECTOMY CHRONDOPLASTY MEDIAL PLICA RESECTION;  Surgeon: Alta Corning, MD;  Location: [facility];  Service: Orthopedics;  Laterality: Left;   KNEE SURGERY  06/22/2015   LIVER BIOPSY  summer 2011   LOOP RECORDER INSERTION N/A 10/21/2018   Procedure: LOOP RECORDER INSERTION;  Surgeon: Thompson Grayer, MD;  Location: [facility];  Service: Cardiovascular;  Laterality: N/A;   LUNG BIOPSY  summer 2011   PARTIAL HYSTERECTOMY  04/1991   partial   TEE WITHOUT CARDIOVERSION N/A 10/21/2018   Procedure: TRANSESOPHAGEAL ECHOCARDIOGRAM (TEE);  Surgeon: Elouise Munroe, MD;  Location: [facility];  Service: Cardiology;  Laterality: N/A;  possible loop recorder    Social History:  Social History   Socioeconomic History   Marital status: Single    Spouse name: Not on file   Number of children: N   Years of education: Not on file   Highest education level: Not on file  Occupational History   Occupation: parts specialist [employer] resource strain: Not on file   Food insecurity    Worry: Not on file     Inability: Not on file   Transportation needs    Medical: Not on file    Non-medical: Not on file  Tobacco Use   Smoking status: Former Smoker    Packs/day: 1.00    Years: 31.00    Pack years: 31.00    Types: Cigarettes    Quit date: 10/08/2000    Years since quitting: 18.6   Smokeless tobacco: Never Used  Substance and Sexual Activity   Alcohol use: Yes    Comment: rarely   Drug use: No   Sexual activity: Not on file  Lifestyle   Physical activity    Days per week: Not on file    Minutes per session: Not on file   Stress: Not on file  Relationships   Social connections    Talks on phone: Not on file    Gets together: Not on file    Attends religious service: Not on file    Active member of club or organization: Not on file    Attends meetings of clubs or organizations: Not on file    Relationship status: Not on file   Intimate partner violence  Fear of current or ex partner: Not on file    Emotionally abused: Not on file    Physically abused: Not on file    Forced sexual activity: Not on file  Other Topics Concern   Not on file  Social History Narrative   Not on file    Family History:  Family History  Problem Relation Age of Onset   Hypertension Father    Emphysema Father    Heart attack Father    Cirrhosis Father    Coronary artery disease Mother    Heart failure Mother    COPD Mother    Breast cancer Maternal Grandmother    HIV Brother    Diabetes Sister     Medications:   Current Outpatient Medications on File Prior to Visit  Medication Sig Dispense Refill   ARIPiprazole (ABILIFY) 2 MG tablet Take 2 mg by mouth daily.     aspirin EC 81 MG EC tablet Take 1 tablet (81 mg total) by mouth daily.     atorvastatin (LIPITOR) 40 MG tablet Take 1 tablet (40 mg total) by mouth daily at 6 PM. (Patient taking differently: Take 40 mg by mouth daily. ) 30 tablet 2   Cholecalciferol (VITAMIN D3 PO) Take 50,000 Units by mouth once a week.      citalopram (CELEXA) 40 MG tablet Take 40 mg by mouth daily.   1   colesevelam (WELCHOL) 625 MG tablet Take 1,875 mg by mouth 2 (two) times daily.     metoprolol (TOPROL-XL) 50 MG 24 hr tablet Take 50 mg by mouth daily.      Olmesartan-Amlodipine-HCTZ (TRIBENZOR) 40-10-25 MG TABS Take 1 tablet by mouth daily.      No current facility-administered medications on file prior to visit.     Allergies:  No Known Allergies   Physical Exam  Vitals:   05/14/19 0938  BP: 113/71  Pulse: (!) 59  Temp: (!) 97.5 F (36.4 C)  TempSrc: Oral  Weight: 187 lb (84.8 kg)  Height: 5\' 4"  (1.626 m)   Body mass index is 32.1 kg/m. No exam data present  General: well developed, well nourished, pleasant middle-aged African-American female, seated, in no evident distress Head: head normocephalic and atraumatic.   Neck: supple with no carotid or supraclavicular bruits Cardiovascular: regular rate and rhythm, no murmurs Musculoskeletal: no deformity Skin:  no rash/petichiae Vascular:  Normal pulses all extremities  Neurologic Exam Mental Status: Awake and fully alert. Oriented to place and time. Recent and remote memory intact. Attention span, concentration and fund of knowledge appropriate. Mood and affect appropriate.  Unable to appreciate memory difficulties during visit. Cranial Nerves: Pupils equal, briskly reactive to light. Extraocular movements full without nystagmus. Visual fields full to confrontation. Hearing intact. Facial sensation intact. Face, tongue, palate moves normally and symmetrically.  Motor: Normal bulk and tone. Normal strength in all tested extremity muscles. Sensory.: intact to touch , pinprick , position and vibratory sensation.  Coordination: Rapid alternating movements normal in all extremities. Finger-to-nose and heel-to-shin performed accurately bilaterally. Gait and Station: Arises from chair without difficulty. Stance is normal. Gait demonstrates normal stride  length and balance. Able to heel, toe and tandem walk without difficulty.  Reflexes: 1+ and symmetric. Toes downgoing.       Diagnostic Data (Labs, Imaging, Testing)  CT HEAD WO CONTRAST 09/29/2018 IMPRESSION: 1. Negative CT head 2. ASPECTS is 10  CT ANGIO HEAD W OR WO CONTRAST CT ANGIO NECK W OR WO CONTRAST 09/29/2018 IMPRESSION: Focal  stenosis right MCA bifurcation compatible with embolus. Possible branch occlusion right M2. No significant carotid or vertebral stenosis in the neck.  MR BRAIN WO CONTRAST 09/30/2018 IMPRESSION: Scattered areas of acute infarction in the right MCA territory as outlined above. The largest region affects the deep insula. Minimal petechial blood products in that location. No frank hematoma.  ECHOCARDIOGRAM 09/29/2018 Study Conclusions - Left ventricle: The cavity size was normal. Wall thickness was   normal. Systolic function was normal. The estimated ejection   fraction was in the range of 55% to 60%. Wall motion was normal;   there were no regional wall motion abnormalities. Left   ventricular diastolic function parameters were normal. - Left atrium: The atrium was mildly dilated. - Atrial septum: No defect or patent foramen ovale was identified.  ECHO TEE 10/21/2018 Study Conclusions - Left ventricle: Systolic function was normal. The estimated   ejection fraction was in the range of 60% to 65%. Wall motion was   normal; there were no regional wall motion abnormalities. - Aorta: There was mild atheromatous plaque. - Mitral valve: There was trivial regurgitation. - Left atrium: No evidence of thrombus in the atrial cavity or   appendage. - Right ventricle: The cavity size was normal. Wall thickness was   normal. Systolic function was normal. - Right atrium: The atrium was normal in size. - Atrial septum: Aneurysmal atrial septum with significant   mobility. No evidence of atrial level shunt by color flow Doppler   or agitated  saline contrast injection. - Tricuspid valve: There was trivial regurgitation. - Pericardium, extracardiac: There was no pericardial effusion. Impressions: - No cardiac source of emboli was identified.  ILR placement 10/21/2018     ASSESSMENT: Breanna Perry is a 63 y.o. year old female here with scattered right MCA infarcts embolic pattern on 61/60/7371 secondary to undetermined source.  TEE negative on 10/21/2018 therefore loop recorder placed.  Vascular risk factors include HTN, HLD, former tobacco use and obesity.  Residual deficits cognition/memory complaints and headache    PLAN:  1. Right MCA infarcts: Continue aspirin 81 mg daily  and atorvastatin 40 mg for secondary stroke prevention. Maintain strict control of hypertension with blood pressure goal below 130/90, diabetes with hemoglobin A1c goal below 6.5% and cholesterol with LDL cholesterol (bad cholesterol) goal below 70 mg/dL.  I also advised the patient to eat a healthy diet with plenty of whole grains, cereals, fruits and vegetables, exercise regularly with at least 30 minutes of continuous activity daily and maintain ideal body weight.  2. Cognitive concerns: Referral placed to neuropsychology for neurocognitive evaluation.  Discussion regarding likely residual deficit from stroke and potential influence of depression/anxiety and increased stress.  Also advised her to continue to follow with her routine psychiatrist 3. Headaches: Likely residual deficit of stroke and as they have been improving, recommend monitoring at this time.  Advised her to notify office with any worsening or new headache concerns 4. HTN: Advised to continue current treatment regimen.  Today's BP stable.  Advised to continue to follow with PCP for ongoing management 5. HLD: Advised to continue current treatment regimen along with continued follow-up with PCP for future prescribing and monitoring of lipid panel 6. Loop recorder placement: Continue to  monitor for atrial fibrillation   At this time, she can follow-up as needed but advised her if recommended follow-up after neurocognitive evaluation needed, she will be rescheduled at this time   Greater than 50% of time during this 25 minute visit was spent  on counseling, explanation of diagnosis of right MCA infarcts, reviewing risk factor management of HTN and HLD, discussion regarding residual deficits, planning of further management along with potential future management, and discussion with patient and family answering all questions.    Venancio Poisson, AGNP-BC  [facility] [address] [place] [place], [place] [address]  Phone [phone] Fax [phone] Note: This document was prepared with digital dictation and possible smart phrase technology. Any transcriptional errors that result from this process are unintentional.

## 2019-05-14 NOTE — Patient Instructions (Signed)
Your Plan:  Continue current treatment plan  Referral placed for neurocognitive evaluation -he will be called to schedule this visit  Continue to monitor your head pains and let us know if anything worsens        Thank you for coming to see [place] at [facility]. I hope we have been able to provide you high quality care today.  You may receive a patient satisfaction survey over the next few weeks. We would appreciate your feedback and comments so that we may continue to improve ourselves and the health of our patients.

## 2019-05-15 NOTE — Progress Notes (Signed)
[facility] Summary Report / Loop Recorder 

## 2019-05-18 NOTE — Progress Notes (Signed)
I agree with the above plan 

## 2019-05-27 DIAGNOSIS — F329 Major depressive disorder, single episode, unspecified: Secondary | ICD-10-CM | POA: Diagnosis not present

## 2019-05-27 DIAGNOSIS — E785 Hyperlipidemia, unspecified: Secondary | ICD-10-CM | POA: Diagnosis not present

## 2019-05-27 DIAGNOSIS — R413 Other amnesia: Secondary | ICD-10-CM | POA: Diagnosis not present

## 2019-05-27 DIAGNOSIS — I10 Essential (primary) hypertension: Secondary | ICD-10-CM | POA: Diagnosis not present

## 2019-06-02 DIAGNOSIS — F411 Generalized anxiety disorder: Secondary | ICD-10-CM | POA: Diagnosis not present

## 2019-06-09 ENCOUNTER — Ambulatory Visit (INDEPENDENT_AMBULATORY_CARE_PROVIDER_SITE_OTHER): Payer: BC Managed Care – PPO | Admitting: *Deleted

## 2019-06-09 DIAGNOSIS — I63511 Cerebral infarction due to unspecified occlusion or stenosis of right middle cerebral artery: Secondary | ICD-10-CM

## 2019-06-10 DIAGNOSIS — E669 Obesity, unspecified: Secondary | ICD-10-CM | POA: Diagnosis not present

## 2019-06-10 DIAGNOSIS — Z6832 Body mass index (BMI) 32.0-32.9, adult: Secondary | ICD-10-CM | POA: Diagnosis not present

## 2019-06-10 DIAGNOSIS — Z713 Dietary counseling and surveillance: Secondary | ICD-10-CM | POA: Diagnosis not present

## 2019-06-10 LAB — CUP PACEART REMOTE DEVICE CHECK
Date Time Interrogation Session: 20200901234155
Implantable Pulse Generator Implant Date: 20200114

## 2019-06-23 DIAGNOSIS — E669 Obesity, unspecified: Secondary | ICD-10-CM | POA: Diagnosis not present

## 2019-06-23 DIAGNOSIS — Z6831 Body mass index (BMI) 31.0-31.9, adult: Secondary | ICD-10-CM | POA: Diagnosis not present

## 2019-06-23 DIAGNOSIS — Z713 Dietary counseling and surveillance: Secondary | ICD-10-CM | POA: Diagnosis not present

## 2019-06-23 NOTE — Progress Notes (Signed)
[facility] Summary Report / Loop Recorder 

## 2019-07-09 DIAGNOSIS — N183 Chronic kidney disease, stage 3 unspecified: Secondary | ICD-10-CM | POA: Diagnosis not present

## 2019-07-09 DIAGNOSIS — I10 Essential (primary) hypertension: Secondary | ICD-10-CM | POA: Diagnosis not present

## 2019-07-09 DIAGNOSIS — F322 Major depressive disorder, single episode, severe without psychotic features: Secondary | ICD-10-CM | POA: Diagnosis not present

## 2019-07-09 DIAGNOSIS — E782 Mixed hyperlipidemia: Secondary | ICD-10-CM | POA: Diagnosis not present

## 2019-07-13 ENCOUNTER — Ambulatory Visit (INDEPENDENT_AMBULATORY_CARE_PROVIDER_SITE_OTHER): Payer: BC Managed Care – PPO | Admitting: *Deleted

## 2019-07-13 DIAGNOSIS — I63511 Cerebral infarction due to unspecified occlusion or stenosis of right middle cerebral artery: Secondary | ICD-10-CM | POA: Diagnosis not present

## 2019-07-14 DIAGNOSIS — E669 Obesity, unspecified: Secondary | ICD-10-CM | POA: Diagnosis not present

## 2019-07-14 DIAGNOSIS — Z713 Dietary counseling and surveillance: Secondary | ICD-10-CM | POA: Diagnosis not present

## 2019-07-14 DIAGNOSIS — Z6831 Body mass index (BMI) 31.0-31.9, adult: Secondary | ICD-10-CM | POA: Diagnosis not present

## 2019-07-14 LAB — CUP PACEART REMOTE DEVICE CHECK
Date Time Interrogation Session: 20201004234244
Implantable Pulse Generator Implant Date: 20200114

## 2019-07-21 NOTE — Progress Notes (Signed)
[facility] Summary Report / Loop Recorder 

## 2019-07-23 DIAGNOSIS — M67911 Unspecified disorder of synovium and tendon, right shoulder: Secondary | ICD-10-CM | POA: Diagnosis not present

## 2019-07-29 DIAGNOSIS — E782 Mixed hyperlipidemia: Secondary | ICD-10-CM | POA: Diagnosis not present

## 2019-07-29 DIAGNOSIS — I10 Essential (primary) hypertension: Secondary | ICD-10-CM | POA: Diagnosis not present

## 2019-07-29 DIAGNOSIS — Z23 Encounter for immunization: Secondary | ICD-10-CM | POA: Diagnosis not present

## 2019-08-04 DIAGNOSIS — Z713 Dietary counseling and surveillance: Secondary | ICD-10-CM | POA: Diagnosis not present

## 2019-08-04 DIAGNOSIS — Z6831 Body mass index (BMI) 31.0-31.9, adult: Secondary | ICD-10-CM | POA: Diagnosis not present

## 2019-08-04 DIAGNOSIS — E669 Obesity, unspecified: Secondary | ICD-10-CM | POA: Diagnosis not present

## 2019-08-14 ENCOUNTER — Ambulatory Visit (INDEPENDENT_AMBULATORY_CARE_PROVIDER_SITE_OTHER): Payer: BC Managed Care – PPO | Admitting: *Deleted

## 2019-08-14 DIAGNOSIS — I63511 Cerebral infarction due to unspecified occlusion or stenosis of right middle cerebral artery: Secondary | ICD-10-CM | POA: Diagnosis not present

## 2019-08-15 LAB — CUP PACEART REMOTE DEVICE CHECK
Date Time Interrogation Session: 20201106234527
Implantable Pulse Generator Implant Date: 20200114

## 2019-08-27 DIAGNOSIS — F329 Major depressive disorder, single episode, unspecified: Secondary | ICD-10-CM | POA: Diagnosis not present

## 2019-08-30 NOTE — Progress Notes (Signed)
[facility] Summary Report / Loop Recorder 

## 2019-09-16 ENCOUNTER — Ambulatory Visit (INDEPENDENT_AMBULATORY_CARE_PROVIDER_SITE_OTHER): Payer: BC Managed Care – PPO | Admitting: *Deleted

## 2019-09-16 DIAGNOSIS — I63511 Cerebral infarction due to unspecified occlusion or stenosis of right middle cerebral artery: Secondary | ICD-10-CM

## 2019-09-17 LAB — CUP PACEART REMOTE DEVICE CHECK
Date Time Interrogation Session: 20201209184504
Implantable Pulse Generator Implant Date: 20200114

## 2019-10-05 ENCOUNTER — Encounter: Payer: Self-pay | Admitting: Psychology

## 2019-10-05 ENCOUNTER — Other Ambulatory Visit: Payer: Self-pay

## 2019-10-05 ENCOUNTER — Encounter: Payer: BC Managed Care – PPO | Attending: Psychology | Admitting: Psychology

## 2019-10-05 DIAGNOSIS — I69311 Memory deficit following cerebral infarction: Secondary | ICD-10-CM | POA: Diagnosis not present

## 2019-10-05 DIAGNOSIS — M3500 Sicca syndrome, unspecified: Secondary | ICD-10-CM | POA: Diagnosis not present

## 2019-10-05 DIAGNOSIS — I6990 Unspecified sequelae of unspecified cerebrovascular disease: Secondary | ICD-10-CM | POA: Diagnosis not present

## 2019-10-05 NOTE — Progress Notes (Signed)
Neuropsychological Consultation   Patient:   Breanna Perry   DOB:   [birth year]  MR Number:  [id number]  Location:  [facility] [place], [place] [id number] [facility] [place] [address] Dept: [phone]           Date of Service:   10/05/2019  Start Time:   2 PM End Time:   4 PM  Today's visit was an in person visit.  It was conducted in my outpatient clinic office with myself and the patient present.  First hour was face-to-face clinical interview and the second hour was review of available medical records and report writing.  Provider/Observer:  Ilean Skill, Psy.D.       Clinical Neuropsychologist       Billing Code/Service: [phone], [phone]  Chief Complaint:    Breanna Perry is a 63 year old female referred by Frann Rider, NP with [facility] due to ongoing cognitive changes and other symptoms following the stroke in December 2019.  The patient is also being followed by her PCP [name], MD.  The patient has a history of surgeons disease, tinnitus, hypertension, hyperlipidemia.  The patient presented to the emergency department on 09/29/2018 with sudden onset headache that was described as severe and coworkers noticing that she had left-sided facial droop.  There was also dysarthria and decreased sensation of left arm noted in the emergency department.  The patient's CTA showed a likely right M2 branch occlusion.  MRI showed scattered right MCA territory infarcts.  The patient has had a loop recorder placement.  The patient has continued to have concerns about cognitive functioning and memory loss that been present since her stroke but feels like it may be worsening over time.  She describes decreased concentration, difficulty remembering certain words and spelling errors and retrieval of information difficulty.  The patient also describes "spiky pains in  her head", lack of concentration, lack of speech fluency, trembling in her legs at night as well as in her bottom lip and sometimes her hands.  She also describes a loss of appetite that started approximately 2 weeks ago.  Reason for Service:  Breanna Perry is a 63 year old female referred by Frann Rider, NP with [facility] due to ongoing cognitive changes and other symptoms following the stroke in December 2019.  The patient is also being followed by her PCP [name], MD.  The patient has a history of surgeons disease, tinnitus, hypertension, hyperlipidemia.  The patient presented to the emergency department on 09/29/2018 with sudden onset headache that was described as severe and coworkers noticing that she had left-sided facial droop.  There was also dysarthria and decreased sensation of left arm noted in the emergency department.  The patient's CTA showed a likely right M2 branch occlusion.  MRI showed scattered right MCA territory infarcts.  The patient has had a loop recorder placement.  The patient has continued to have concerns about cognitive functioning and memory loss that been present since her stroke but feels like it may be worsening over time.  She describes decreased concentration, difficulty remembering certain words and spelling errors and retrieval of information difficulty.  The patient also describes "spiky pains in her head", lack of concentration, lack of speech fluency, trembling in her legs at night as well as in her bottom lip and sometimes her hands.  She also describes a loss of appetite that started approximately 2 weeks ago.  The patient  reports that her initial symptoms in December 2019 were developing a severe headache and being a bit confused about how to get in her vehicle.  One of her colleagues told her that she was speaking incoherently and noticed that her face was drooping and 911 was called and she was taken to [facility].  The  patient reports that many of her muscle changes have improved and she does not notice any muscle weakness or coordination issues.  The patient reports that she has experienced a significant loss in appetite that started approximately 2 months ago when she just does not feel like eating.  The patient describes prickly headaches that happen at night and she associates these headaches with her stroke and fears that she is having another stroke.  The patient also describes significant problems with concentration and attention and trouble recalling simple things that she should remember and difficulty staying focused.  The patient describes trembling in her legs at night and reports that she will wake up feel like someone is in the bed with her because of the movement.  The patient reports that the symptoms have generally stayed the same for some time.  The patient has also been seen through the [facility] hospital and I do not have access to these records.  The patient initially had a sleep study on 07/31/2017 where she was diagnosed with moderate obstructive sleep apnea after a visit with Keturah Barre, MD with the [facility] sleep lab.  For what ever reason the patient did not start using a CPAP machine at that time.  The patient reports that she is also been seen at the [facility] recently for another sleep study and on this 1 she reports that she was diagnosed with severe sleep apnea.  She still has not started using a CPAP machine and she reports that they are waiting for her to call back.  I did stress that she needs to follow-up on this and be fitted for a CPAP device.  The patient is also been diagnosed with Sjogren's disease and some of her other symptoms that have developed may be related to this condition.  The patient reports that she is also being assessed for possible other associating conditions such as MS/lupus.  None of these results have been completed.  The patient reports that she wakes up 2-4 times each  night and has poor sleep and often wakes up because of her tremors in her legs.  The patient reports that she has a lack of appetite and has difficulty with her memory recall and lack of concentration.  Reliability of Information: Information is derived from 1 hour face-to-face clinical interview as well as review of available medical records.  Behavioral Observation: Breanna Perry  presents as a 63 y.o.-year-old Right African American Female who appeared her stated age. her dress was Appropriate and she was Well Groomed and her manners were Appropriate to the situation.  her participation was indicative of Appropriate and Redirectable behaviors.  There were not any physical disabilities noted.  she displayed an appropriate level of cooperation and motivation.     Interactions:    Active Appropriate and Redirectable  Attention:   abnormal and attention span appeared shorter than expected for age  Memory:   abnormal; remote memory intact, recent memory impaired  Visuo-spatial:  not examined  Speech (Volume):  normal  Speech:   normal; with some word finding difficulties noted.  Thought Process:  Coherent and Relevant  Though Content:  WNL;  not suicidal and not homicidal  Orientation:   person, place, time/date and situation  Judgment:   Good  Planning:   Fair  Affect:    Anxious  Mood:    Anxious  Insight:   Good  Intelligence:   normal  Marital Status/Living: The patient was born and raised in [place] with 6 siblings.  The patient currently lives alone and has lived alone for the past 13 years.  The patient has never been married and has no children.  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  Substance Use:  No concerns of substance abuse are reported.    Education:   The patient has her masters degree.  She attended the [employer] and [employer].  Medical History:   Past Medical History:  Diagnosis Date  . Anxiety   . Dental bridge present    upper and lower  . Depression   . Hypertension    under control with med., has been on med. x 8 yr.  . Medial meniscus tear 12/2012   left  . Sjogren's syndrome ([place])   . Stroke [facility])          Psychiatric History:  Patient does have a history of anxiety and depressive symptoms in the past but denies any significant exacerbation of the symptoms although she is rather anxious about possible further strokes.  The patient reports that prior to her stroke she had significant issues with obsessive-compulsive type symptoms particularly with compulsively making lists and repetitively planning out things.  However, she reports that the symptoms essentially stopped after her stroke.  Family Med/Psych History:  Family History  Problem Relation Age of Onset  . Hypertension Father   . Emphysema Father   . Heart attack Father   . Cirrhosis Father   . Coronary artery disease Mother   . Heart failure Mother   . COPD Mother   . Breast cancer Maternal Grandmother   . HIV Brother   . Diabetes Sister     Impression/DX:  Breanna Perry is a 63 year old female referred by Frann Rider, NP with [facility] due to ongoing cognitive changes and other symptoms following the stroke in December 2019.  The patient is also being followed by her PCP [name], MD.  The patient has a history of surgeons disease, tinnitus, hypertension, hyperlipidemia.  The patient presented to the emergency department on 09/29/2018 with sudden onset headache that was described as severe and coworkers noticing that she had left-sided facial droop.  There was also dysarthria and decreased sensation of left arm noted in the emergency department.  The patient's CTA showed a likely right M2 branch occlusion.  MRI showed scattered right MCA territory infarcts.   The patient has had a loop recorder placement.  The patient has continued to have concerns about cognitive functioning and memory loss that been present since her stroke but feels like it may be worsening over time.  She describes decreased concentration, difficulty remembering certain words and spelling errors and retrieval of information difficulty.  The patient also describes "spiky pains in her head", lack of concentration, lack of speech fluency, trembling in her legs at night as well as in her bottom lip and sometimes her hands.  She also describes a loss of appetite that started approximately 2 weeks ago.   Disposition/Plan:  The patient has been  set up for formal neuropsychological testing.  Initially we will administer the [facility] adult intelligence scale-from in all 4 as well as the Wechsler Memory Scale's.  We will also administered the comprehensive attention battery and CPT measures.  Other test may be utilized if warranted once these formal test batteries have been completed.  I have also encouraged the patient to follow-up with the [facility] about initiating CPAP use and she is to follow up with some of the studies relative to her Sjogren's disease and the fact that it may be playing a role in some of her symptoms that she is experiencing that of been associated with her stroke.  Diagnosis:    Sjogren's syndrome, with unspecified organ involvement ([place])  Late effects of CVA (cerebrovascular accident)  Memory deficit after cerebral infarction         Electronically Signed   _______________________ Ilean Skill, Psy.D.

## 2019-10-12 DIAGNOSIS — L509 Urticaria, unspecified: Secondary | ICD-10-CM | POA: Diagnosis not present

## 2019-10-12 DIAGNOSIS — R630 Anorexia: Secondary | ICD-10-CM | POA: Diagnosis not present

## 2019-10-12 DIAGNOSIS — R5383 Other fatigue: Secondary | ICD-10-CM | POA: Diagnosis not present

## 2019-10-15 DIAGNOSIS — Z20828 Contact with and (suspected) exposure to other viral communicable diseases: Secondary | ICD-10-CM | POA: Diagnosis not present

## 2019-10-15 DIAGNOSIS — R5381 Other malaise: Secondary | ICD-10-CM | POA: Diagnosis not present

## 2019-10-18 DIAGNOSIS — D649 Anemia, unspecified: Secondary | ICD-10-CM | POA: Diagnosis not present

## 2019-10-18 DIAGNOSIS — R634 Abnormal weight loss: Secondary | ICD-10-CM | POA: Diagnosis not present

## 2019-10-18 DIAGNOSIS — R42 Dizziness and giddiness: Secondary | ICD-10-CM | POA: Diagnosis not present

## 2019-10-19 ENCOUNTER — Ambulatory Visit (INDEPENDENT_AMBULATORY_CARE_PROVIDER_SITE_OTHER): Payer: BC Managed Care – PPO | Admitting: *Deleted

## 2019-10-19 DIAGNOSIS — I63511 Cerebral infarction due to unspecified occlusion or stenosis of right middle cerebral artery: Secondary | ICD-10-CM

## 2019-10-19 LAB — CUP PACEART REMOTE DEVICE CHECK
Date Time Interrogation Session: 20210111185714
Implantable Pulse Generator Implant Date: 20200114

## 2019-10-26 ENCOUNTER — Encounter: Payer: Self-pay | Admitting: Psychology

## 2019-10-26 ENCOUNTER — Encounter: Payer: BC Managed Care – PPO | Attending: Psychology | Admitting: Psychology

## 2019-10-26 ENCOUNTER — Other Ambulatory Visit: Payer: Self-pay

## 2019-10-26 DIAGNOSIS — M3500 Sicca syndrome, unspecified: Secondary | ICD-10-CM | POA: Diagnosis not present

## 2019-10-26 DIAGNOSIS — I6990 Unspecified sequelae of unspecified cerebrovascular disease: Secondary | ICD-10-CM | POA: Insufficient documentation

## 2019-10-26 DIAGNOSIS — I69311 Memory deficit following cerebral infarction: Secondary | ICD-10-CM | POA: Insufficient documentation

## 2019-10-26 NOTE — Progress Notes (Addendum)
The patient arrived on time to her 8:00 testing appointment, which lasted 240 minutes.   Behavioral Observations:  Appearance: Casually and appropriately dressed with adequate hygiene. Gait: Ambulated independently without assistance. Speech: Clear, normal rate, normal tone & volume. Thought process:  Linear, organized, somewhat concrete.   Mood/Affect:  Depressed, appropriate  Interpersonal: Polite and appropriate. Orientation: Oriented x 4 Effort/Motivation: Good   She did not appear to have difficulty seeing, hearing, or understanding test items/questions but did require additional prompting on most measures (e.g. repeated questions/instructions). She exhibited reduced distress tolerance (e.g. mildly frustrated) on questions she did not know or tasks that were more difficult. The patient is scheduled for another 2 hour testing appointment on 10/30/2019 to complete Comprehensive Attention Battery (CAB) and Continuous Performance Test (CAB-CPT).   Tests Administered: . Clock Drawing Test . Wechsler Adult Intelligence Scale, 4th Edition (WAIS-IV) . Wechsler Memory Scale, 4th edition, Adult Battery (WMS-IV-A) Results:  Clock Drawing Test  . WNL   WAIS-IV  Composite Score Summary  Scale Sum of Scaled Scores Composite Score Percentile Rank 95% Conf. Interval Qualitative Description  Verbal Comprehension 25 VCI 91 27 86-97 Average  Perceptual Reasoning 29 PRI 98 45 92-104 Average  Working Memory 14 WMI 83 13 77-91 Low Average  Processing Speed 13 PSI 81 10 75-91 Low Average  Full Scale 81 FSIQ 86 18 82-90 Low Average  General Ability 54 GAI 94 34 89-99 Average   Index Level Discrepancy Comparisons  Comparison Score 1 Score 2 Difference Critical Value .05 Significant Difference Y/N Base Rate by Overall Sample  VCI - PRI 91 98 -7 8.31 N 31.8  VCI - WMI 91 83 8 8.82 N 27.1  VCI - PSI 91 81 10 10.19 N 27.6  PRI - WMI 98 83 15 9.74 Y 12.3  PRI - PSI 98 81 17 11.00 Y 13.4  WMI -  PSI 83 81 2 11.38 N 45.7  FSIQ - GAI 86 94 -8 3.51 Y 5.7   Differences Between Subtest and Overall Mean of Subtest Scores  Subtest Subtest Scaled Score Mean Scaled Score Difference Critical Value .05 Strength or Weakness Base Rate  Block Design 7 8.10 -1.10 2.85  >25%  Similarities 8 8.10 -0.10 2.82  >25%  Digit Span 8 8.10 -0.10 2.22  >25%  Matrix Reasoning 13 8.10 4.90 2.54 S 2%  Vocabulary 10 8.10 1.90 2.03  >25%  Arithmetic 6 8.10 -2.10 2.73  >25%  Symbol Search 8 8.10 -0.10 3.42  >25%  Visual Puzzles 9 8.10 0.90 2.71  >25%  Information 7 8.10 -1.10 2.19  >25%  Coding 5 8.10 -3.10 2.97 W 15-25%   WMS-IV (Adult Battery)   Index Score Summary  Index Sum of Scaled Scores Index Score Percentile Rank 95% Confidence Interval Qualitative Descriptor  Auditory Memory (AMI) 37 95 37 89-101 Average  Visual Memory (VMI) 45 107 68 101-112 Average  Visual Working Memory (VWMI) 16 88 21 82-96 Low Average  Immediate Memory (IMI) 38 96 39 90-102 Average  Delayed Memory (DMI) 44 107 68 100-113 Average   Primary Subtest Scaled Score Summary  Subtest Domain Raw Score Scaled Score Percentile Rank  Logical Memory I AM 23 9 37  Logical Memory II AM 20 10 50  Verbal Paired Associates I AM 27 9 37  Verbal Paired Associates II AM 8 9 37  Designs I VM 66 10 50  Designs II VM 70 14 91  Visual Reproduction I VM 34 10 50  Visual Reproduction  II VM 25 11 63  Spatial Addition VWM 9 8 25   Symbol Span VWM 17 8 25    PROCESS SCORE CONVERSIONS  Auditory Memory Process Score Summary  Process Score Raw Score Scaled Score Percentile Rank Cumulative Percentage (Base Rate)  LM II Recognition 27 - - >75%  VPA II Recognition 36 - - 17-25%   Visual Memory Process Score Summary  Process Score Raw Score Scaled Score Percentile Rank Cumulative Percentage (Base Rate)  DE I Content 38 12 75 -  DE I Spatial 16 10 50 -  DE II Content 40 14 91 -  DE II Spatial 14 12 75 -  DE II Recognition 16 - - 51-75%   VR II Recognition 4 - - 17-25%    ABILITY-MEMORY ANALYSIS  Ability Score:  GAI: 94 Date of Testing:  WAIS-IV; WMS-IV 2019/10/26  Predicted Difference Method   Index Predicted WMS-IV Index Score Actual WMS-IV Index Score Difference Critical Value  Significant Difference Y/N Base Rate  Auditory Memory 97 95 2 8.95 N   Visual Memory 96 107 -11 8.82 Y 20%  Visual Working Memory 96 88 8 11.24 N   Immediate Memory 96 96 0 10.35 N   Delayed Memory 97 107 -10 10.08 N   Statistical significance (critical value) at the .01 level.   Contrast Scaled Scores  Score Score 1 Score 2 Contrast Scaled Score  General Ability Index vs. Auditory Memory Index 94 95 10  General Ability Index vs. Visual Memory Index 94 107 13  General Ability Index vs. Visual Working Memory Index 94 88 8  General Ability Index vs. Immediate Memory Index 94 96 10  General Ability Index vs. Delayed Memory Index 94 107 13  Verbal Comprehension Index vs. Auditory Memory Index 91 95 10  Perceptual Reasoning Index vs. Visual Memory Index 98 107 12  Perceptual Reasoning Index vs. Visual Working Memory Index 98 88 7  Working Memory Index vs. Auditory Memory Index 83 95 11  Working Memory Index vs. Visual Working Memory Index 83 88 10    ABILITY-MEMORY ANALYSIS  Ability Score:  GAI: 94 Date of Testing:  WAIS-IV; WMS-IV 2019/10/26  Predicted Difference Method   Index Predicted WMS-IV Index Score Actual WMS-IV Index Score Difference Critical Value  Significant Difference Y/N Base Rate  Auditory Memory 97 95 2 8.95 N   Visual Memory 96 107 -11 8.82 Y 20%  Visual Working Memory 96 88 8 11.24 N   Immediate Memory 96 96 0 10.35 N   Delayed Memory 97 107 -10 10.08 N   Statistical significance (critical value) at the .01 level.   Contrast Scaled Scores  Score Score 1 Score 2 Contrast Scaled Score  General Ability Index vs. Auditory Memory Index 94 95 10  General Ability Index vs. Visual Memory Index 94 107 13   General Ability Index vs. Visual Working Memory Index 94 88 8  General Ability Index vs. Immediate Memory Index 94 96 10  General Ability Index vs. Delayed Memory Index 94 107 13  Verbal Comprehension Index vs. Auditory Memory Index 91 95 10  Perceptual Reasoning Index vs. Visual Memory Index 98 107 12  Perceptual Reasoning Index vs. Visual Working Memory Index 98 88 7  Working Memory Index vs. Auditory Memory Index 83 95 11  Working Memory Index vs. Visual Working Memory Index 83 88 10

## 2019-10-30 ENCOUNTER — Other Ambulatory Visit: Payer: Self-pay

## 2019-10-30 ENCOUNTER — Encounter: Payer: Self-pay | Admitting: Psychology

## 2019-10-30 ENCOUNTER — Encounter (HOSPITAL_BASED_OUTPATIENT_CLINIC_OR_DEPARTMENT_OTHER): Payer: BC Managed Care – PPO | Admitting: Psychology

## 2019-10-30 DIAGNOSIS — I69311 Memory deficit following cerebral infarction: Secondary | ICD-10-CM | POA: Diagnosis not present

## 2019-10-30 DIAGNOSIS — M3500 Sicca syndrome, unspecified: Secondary | ICD-10-CM

## 2019-10-30 DIAGNOSIS — I6990 Unspecified sequelae of unspecified cerebrovascular disease: Secondary | ICD-10-CM | POA: Diagnosis not present

## 2019-10-30 NOTE — Progress Notes (Signed)
The patient arrived on time to her 10:00 testing appointment, which lasted 120 minutes.  Behavioral Observations:  Appearance: Casually and appropriately dressed with adequate hygiene. Gait: Ambulated independently without assistance. Speech: Clear, normal rate, low tone & volume.  Thought process:  Logical, organized, somewhat concrete.  Mood/Affect: Depressed, blunted Interpersonal: Polite and appropriate. Orientation: Oriented x 4 Effort/Motivation: Good   She did not appear to have difficulty seeing, hearing, or understanding test items. She exhibited mildly reduced distress tolerance. She was cooperative with all assigned tasks.  Tests Administered:  . Comprehension Attention Battery (CAB) . CAB Continuous Performance Test (CPT) Results  To be included in final report. See previous note (10/26/2019) for results of Clock Drawing Test, WAIS-IV & WMS-IV (Adult Battery)

## 2019-11-09 DIAGNOSIS — D72819 Decreased white blood cell count, unspecified: Secondary | ICD-10-CM | POA: Diagnosis not present

## 2019-11-09 DIAGNOSIS — D649 Anemia, unspecified: Secondary | ICD-10-CM | POA: Diagnosis not present

## 2019-11-09 DIAGNOSIS — R944 Abnormal results of kidney function studies: Secondary | ICD-10-CM | POA: Diagnosis not present

## 2019-11-11 ENCOUNTER — Telehealth: Payer: Self-pay | Admitting: Oncology

## 2019-11-11 NOTE — Telephone Encounter (Signed)
Received a new hem referral from Dr. Jeremy Johann at [facility] for anemia and neutropenia. [name] has been cld and scheduled to see Dr. Alen Blew on 2/3 at 2pm. Pt aware to arrive 15 minutes early.

## 2019-11-12 ENCOUNTER — Inpatient Hospital Stay: Payer: No Typology Code available for payment source | Attending: Oncology | Admitting: Oncology

## 2019-11-12 ENCOUNTER — Other Ambulatory Visit: Payer: Self-pay

## 2019-11-12 ENCOUNTER — Other Ambulatory Visit: Payer: Self-pay | Admitting: Gastroenterology

## 2019-11-12 VITALS — BP 96/52 | HR 73 | Temp 98.2°F | Resp 18 | Ht 64.0 in | Wt 168.0 lb

## 2019-11-12 DIAGNOSIS — D72819 Decreased white blood cell count, unspecified: Secondary | ICD-10-CM

## 2019-11-12 DIAGNOSIS — R710 Precipitous drop in hematocrit: Secondary | ICD-10-CM | POA: Diagnosis not present

## 2019-11-12 DIAGNOSIS — D649 Anemia, unspecified: Secondary | ICD-10-CM | POA: Diagnosis not present

## 2019-11-12 NOTE — Progress Notes (Signed)
Reason for the request:    Anemia and leukocytopenia.  HPI: I was asked by Dr. Tamala Julian to evaluate Ms. Wiersma for new finding of worsening anemia and leukocytopenia.  She is 64 year old woman with history of CVA in December 2019 in addition to Sjogren's syndrome.  She had laboratory testing on October 12, 2019 which showed a white cell count of 2.1, hemoglobin of 10.2 with MCV 100 and platelet count of 336.  The differential showed a neutrophil count of 68%, normal lymphocytes and monocyte percentage.  She did have mild eosinophilia of 0.4%.  Repeat CBC done on November 09, 2019 showed a white cell count of 1.6, hemoglobin of 7.9 with MCV 102.7.  Platelet count was 402 with monocytosis of 12.5%.  Absolute neutrophil count was 900 although the neutrophil percentage was normal at 51.8%.  Her work-up to include iron studies which showed an ferritin of 435, iron level of 132 with saturation of 48%.  Her B12 level was at 1500.  Creatinine was 1.25 with a creatinine clearance of 53 cc/min.  Previous counts were reviewed which showed mild leukocytopenia dating back to 2013.  Her white cell count was as low as 2.6 in March 2013 and was 3.0 in September 2012.  Clinically, she reports a few complaints including increased fatigue and tiredness.  She denies any chest pain or dyspnea on exertion.  She does report increased lightheadedness and dizziness with exertion.  She denies any hematochezia, melena or hemoptysis.  She denies any abdominal pain or early satiety.  She denies any weight loss.  She does not report any headaches, blurry vision, syncope or seizures. Does not report any fevers, chills or sweats.  Does not report any cough, wheezing or hemoptysis.  Does not report any chest pain, palpitation, orthopnea or leg edema.  Does not report any nausea, vomiting or abdominal pain.  Does not report any constipation or diarrhea.  Does not report any skeletal complaints.    Does not report frequency, urgency or  hematuria.  Does not report any skin rashes or lesions. Does not report any heat or cold intolerance.  Does not report any lymphadenopathy or petechiae.  Does not report any anxiety or depression.  Remaining review of systems is negative.    Past Medical History:  Diagnosis Date  . Anxiety   . Dental bridge present    upper and lower  . Depression   . Hypertension    under control with med., has been on med. x 8 yr.  . Medial meniscus tear 12/2012   left  . Sjogren's syndrome ([place])   . Stroke [facility])   :  Past Surgical History:  Procedure Laterality Date  . APPENDECTOMY  04/1991  . KNEE ARTHROSCOPY Left 01/07/2013   Procedure: LEFT KNEE ARTHROSCOPY PARTIAL MEDIAL MENISECTOMY CHRONDOPLASTY MEDIAL PLICA RESECTION;  Surgeon: Alta Corning, MD;  Location: [facility];  Service: Orthopedics;  Laterality: Left;  . KNEE SURGERY  06/22/2015  . LIVER BIOPSY  summer 2011  . LOOP RECORDER INSERTION N/A 10/21/2018   Procedure: LOOP RECORDER INSERTION;  Surgeon: Thompson Grayer, MD;  Location: [place] [facility];  Service: Cardiovascular;  Laterality: N/A;  . LUNG BIOPSY  summer 2011  . PARTIAL HYSTERECTOMY  04/1991   partial  . TEE WITHOUT CARDIOVERSION N/A 10/21/2018   Procedure: TRANSESOPHAGEAL ECHOCARDIOGRAM (TEE);  Surgeon: Elouise Munroe, MD;  Location: [place];  Service: Cardiology;  Laterality: N/A;  possible loop recorder  :   Current Outpatient Medications:  .  ARIPiprazole (ABILIFY) 2 MG tablet, Take 2 mg by mouth daily., Disp: , Rfl:  .  aspirin EC 81 MG EC tablet, Take 1 tablet (81 mg total) by mouth daily., Disp: , Rfl:  .  atorvastatin (LIPITOR) 40 MG tablet, Take 1 tablet (40 mg total) by mouth daily at 6 PM. (Patient taking differently: Take 40 mg by mouth daily. ), Disp: 30 tablet, Rfl: 2 .  Cholecalciferol (VITAMIN D3 PO), Take 50,000 Units by mouth once a week., Disp: , Rfl:  .  citalopram (CELEXA) 40 MG tablet, Take 40 mg by mouth daily. , Disp: , Rfl: 1 .   colesevelam (WELCHOL) 625 MG tablet, Take 1,875 mg by mouth 2 (two) times daily., Disp: , Rfl:  .  metoprolol (TOPROL-XL) 50 MG 24 hr tablet, Take 50 mg by mouth daily. , Disp: , Rfl:  .  Olmesartan-Amlodipine-HCTZ (TRIBENZOR) 40-10-25 MG TABS, Take 1 tablet by mouth daily. , Disp: , Rfl: :  No Known Allergies:  Family History  Problem Relation Age of Onset  . Hypertension Father   . Emphysema Father   . Heart attack Father   . Cirrhosis Father   . Coronary artery disease Mother   . Heart failure Mother   . COPD Mother   . Breast cancer Maternal Grandmother   . HIV Brother   . Diabetes Sister   :  Social History   Socioeconomic History  . Marital status: Single    Spouse name: Not on file  . Number of children: N  . Years of education: Not on file  . Highest education level: Not on file  Occupational History  . Occupation: parts specialist [employer]   Tobacco Use  . Smoking status: Former Smoker    Packs/day: 1.00    Years: 31.00    Pack years: 31.00    Types: Cigarettes    Quit date: 10/08/2000    Years since quitting: 19.1  . Smokeless tobacco: Never Used  Substance and Sexual Activity  . Alcohol use: Yes    Comment: rarely  . Drug use: No  . Sexual activity: Not on file  Other Topics Concern  . Not on file  Social History Narrative  . Not on file   Social Determinants of Health   Financial Resource Strain:   . Difficulty of Paying Living Expenses: Not on file  Food Insecurity:   . Worried About Charity fundraiser in the Last Year: Not on file  . Ran Out of Food in the Last Year: Not on file  Transportation Needs:   . Lack of Transportation (Medical): Not on file  . Lack of Transportation (Non-Medical): Not on file  Physical Activity:   . Days of Exercise per Week: Not on file  . Minutes of Exercise per Session: Not on file  Stress:   . Feeling of Stress : Not on file  Social Connections:   . Frequency of Communication with Friends and Family:  Not on file  . Frequency of Social Gatherings with Friends and Family: Not on file  . Attends Religious Services: Not on file  . Active Member of Clubs or Organizations: Not on file  . Attends Archivist Meetings: Not on file  . Marital Status: Not on file  Intimate Partner Violence:   . Fear of Current or Ex-Partner: Not on file  . Emotionally Abused: Not on file  . Physically Abused: Not on file  . Sexually Abused: Not on file  :  Exam   Blood pressure (!) 96/52, pulse 73, temperature 98.2 F (36.8 C), temperature source Temporal, resp. rate 18, height '5\' 4"'  (1.626 m), weight 168 lb (76.2 kg), SpO2 100 %.   ECOG 1   General appearance: alert and cooperative appeared without distress. Head: atraumatic without any abnormalities. Eyes: conjunctivae/corneas clear. PERRL.  Sclera anicteric. Throat: lips, mucosa, and tongue normal; without oral thrush or ulcers. Resp: clear to auscultation bilaterally without rhonchi, wheezes or dullness to percussion. Cardio: regular rate and rhythm, S1, S2 normal, no murmur, click, rub or gallop GI: soft, non-tender; bowel sounds normal; no masses,  no organomegaly Skin: Skin color, texture, turgor normal. No rashes or lesions Lymph nodes: Cervical, supraclavicular, and axillary nodes normal. Neurologic: Grossly normal without any motor, sensory or deep tendon reflexes. Musculoskeletal: No joint deformity or effusion.  CBC    Component Value Date/Time   WBC 5.8 10/10/2018 1333   WBC 3.5 (L) 09/29/2018 1140   RBC 3.63 (L) 10/10/2018 1333   RBC 3.92 09/29/2018 1140   HGB 11.6 10/10/2018 1333   HCT 34.0 10/10/2018 1333   PLT 389 10/10/2018 1333   MCV 94 10/10/2018 1333   MCH 32.0 10/10/2018 1333   MCH 31.1 09/29/2018 1140   MCHC 34.1 10/10/2018 1333   MCHC 33.1 09/29/2018 1140   RDW 14.7 10/10/2018 1333   LYMPHSABS 1.2 09/29/2018 1140   MONOABS 0.3 09/29/2018 1140   EOSABS 0.0 09/29/2018 1140   BASOSABS 0.0 09/29/2018 1140    CMP Latest Ref Rng & Units 10/10/2018 09/29/2018 09/29/2018  Glucose 65 - 99 mg/dL 83 88 94  BUN 8 - 27 mg/dL '12 11 10  ' Creatinine 0.57 - 1.00 mg/dL 1.04(H) 1.10(H) 1.07(H)  Sodium 134 - 144 mmol/L 141 139 139  Potassium 3.5 - 5.2 mmol/L 4.0 3.3(L) 3.3(L)  Chloride 96 - 106 mmol/L 103 104 102  CO2 20 - 29 mmol/L 23 - 27  Calcium 8.7 - 10.3 mg/dL 9.8 - 9.6  Total Protein 6.5 - 8.1 g/dL - - 7.3  Total Bilirubin 0.3 - 1.2 mg/dL - - 1.0  Alkaline Phos 38 - 126 U/L - - 49  AST 15 - 41 U/L - - 19  ALT 0 - 44 U/L - - 23        Assessment and Plan:   64 year old woman with:  1.  Leukocytopenia and anemia detected in January 2020.  At that time her white cell count total was 2.19 declined to 1.6 in February 2021.  Her differential was with a slight monocytosis.  Her hemoglobin of also dropped from 10.2-7.9.  MCV has increased to 102.7.  The differential diagnosis of these findings were reviewed.  Ruling out bleeding would be reasonable at this time and she is scheduled for an endoscopy in the near future.  Primary hematological condition the bone marrow are important to to evaluate at this time.  Conditions such as myelodysplastic syndrome leukemia, myeloproliferative disorder among others need to be evaluated at this time.  I see no evidence to suggest hemolysis at this point.  I see no clear etiology at this time to explain these findings without investigation of the bone marrow.  Vitamin B12, iron levels all appear to be within normal range.  Autoimmune possibilities could explain some of these findings but does not explain the severity of her acute drop in hemoglobin level.  I have recommended proceeding with a bone marrow biopsy at this time.  Risks and benefits including bleeding, infection and pain were  reiterated.  It is reasonable to defer that option till her endoscopy proceed after bleeding has been ruled out.   After discussion today, she understands the importance of obtaining  bone marrow biopsy especially if her GI work-up is unrevealing.  I suspect that it will be any will be important to obtain a bone marrow biopsy.  She will think about it and let me know before deciding at this time.  She prefers to proceed with endoscopy first before committing to bone marrow biopsy.  2.  Symptomatic anemia: She is experiencing more more symptoms of fatigue and tiredness.  Have discussed the possible need for packed red cell transfusion as well as growth factor support as a way to alleviate her symptoms.  I suggested preventative packed red cell transfusion before she becomes more symptomatic.  She will consider this and let me know before deciding.  I urged her to make these decisions quick to expedite her diagnostic work-up as well as management.  3.  Follow-up: To be determined after bone marrow biopsy was she is agreeable to it.  60  minutes was spent on this encounter.  Time was dedicated to reviewing laboratory data, discussing differential diagnosis, management options as well as complications related to therapy and management choices..   Thank you for the referral. A copy of this consult has been forwarded to the requesting physician.

## 2019-11-13 ENCOUNTER — Telehealth: Payer: Self-pay | Admitting: Oncology

## 2019-11-13 NOTE — Telephone Encounter (Signed)
No los per 2/4. 

## 2019-11-16 ENCOUNTER — Encounter: Payer: Self-pay | Admitting: Psychology

## 2019-11-16 ENCOUNTER — Other Ambulatory Visit: Payer: Self-pay

## 2019-11-16 ENCOUNTER — Encounter (HOSPITAL_COMMUNITY): Payer: Self-pay | Admitting: Gastroenterology

## 2019-11-16 ENCOUNTER — Encounter: Payer: BC Managed Care – PPO | Attending: Psychology | Admitting: Psychology

## 2019-11-16 DIAGNOSIS — I6990 Unspecified sequelae of unspecified cerebrovascular disease: Secondary | ICD-10-CM | POA: Diagnosis not present

## 2019-11-16 DIAGNOSIS — M3500 Sicca syndrome, unspecified: Secondary | ICD-10-CM | POA: Diagnosis not present

## 2019-11-16 DIAGNOSIS — I69311 Memory deficit following cerebral infarction: Secondary | ICD-10-CM | POA: Diagnosis not present

## 2019-11-16 DIAGNOSIS — F09 Unspecified mental disorder due to known physiological condition: Secondary | ICD-10-CM

## 2019-11-16 DIAGNOSIS — F0789 Other personality and behavioral disorders due to known physiological condition: Secondary | ICD-10-CM

## 2019-11-16 NOTE — Progress Notes (Signed)
Patient:  Breanna Perry   DOB: [birth year]  MR Number: [id number]  Location: [facility] [place], [place] [id number] [place] [id number] Dept: [phone]  Start: 2 PM End: 3 PM  Provider/Observer:     Edgardo Roys PsyD  Chief Complaint:      Chief Complaint  Patient presents with  . Memory Loss  . Headache  . Pain    Reason For Service:     Breanna Perry is a 64 year old female referred by Frann Rider, NP with [facility] due to ongoing cognitive changes and other symptoms following the stroke in December 2019.  The patient is also being followed by her PCP [name], MD.  The patient has a history of Sjogren's syndrome, tinnitus, hypertension, hyperlipidemia, stroke, depression and anxiety..  The patient presented to the emergency department on 09/29/2018 with sudden onset headache that was described as severe and coworkers noticing that she had left-sided facial droop.  There was also dysarthria and decreased sensation of left arm noted in the emergency department.  The patient's CTA showed a likely right M2 branch occlusion.  MRI showed scattered right MCA territory infarcts.  The patient has had a loop recorder placement.  The patient has continued to have concerns about cognitive functioning and memory loss that been present since her stroke but feels like it may be worsening over time.  She describes decreased concentration, difficulty remembering certain words and spelling errors and retrieval of information difficulty.  The patient also describes "spiky pains in her head", lack of concentration, lack of speech fluency, trembling in her legs at night as well as in her bottom lip and sometimes her hands.  She also describes a loss of appetite that started approximately 2 weeks ago.  The patient reports that her initial symptoms in December  2019 were developing a severe headache and being a bit confused about how to get in her vehicle.  One of her colleagues told her that she was speaking incoherently and noticed that her face was drooping and 911 was called and she was taken to [facility].  The patient reports that many of her muscle changes have improved and she does not notice any muscle weakness or coordination issues.  The patient reports that she has experienced a significant loss in appetite that started approximately 2 months ago when she just does not feel like eating.  The patient describes prickly headaches that happen at night and she associates these headaches with her stroke and fears that she is having another stroke.  The patient also describes significant problems with concentration and attention and trouble recalling simple things that she should remember and difficulty staying focused.  The patient describes trembling in her legs at night and reports that she will wake up feel like someone is in the bed with her because of the movement.  The patient reports that the symptoms have generally stayed the same for some time.  The patient has also been seen through the [facility] and I do not have access to these records.  The patient initially had a sleep study on 07/31/2017 where she was diagnosed with moderate obstructive sleep apnea after a visit with Keturah Barre, MD with the [facility] sleep [facility].  For what ever reason the patient did not start using a CPAP machine at that time.  The patient reports that she is also been seen at the  [facility] recently for another sleep study and on this 1 she reports that she was diagnosed with severe sleep apnea.  She still has not started using a CPAP machine and she reports that they are waiting for her to call back.  I did stress that she needs to follow-up on this and be fitted for a CPAP device.  The patient is also been diagnosed with Sjogren's disease and some of her other symptoms  that have developed may be related to this condition.  The patient reports that she is also being assessed for possible other associating conditions such as MS/lupus.  None of these results have been completed.  The patient reports that she wakes up 2-4 times each night and has poor sleep and often wakes up because of her tremors in her legs.  The patient reports that she has a lack of appetite and has difficulty with her memory recall and lack of concentration.  Behavioral Observations: Appearance:Casually and appropriately dressed with adequate hygiene. Gait:Ambulated independently without assistance. Speech:Clear, normal rate, normal tone & volume. Thought process: Linear, organized, somewhat concrete.   Mood/Affect: Depressed, appropriate  Interpersonal: Polite and appropriate. Orientation: Oriented x 4 Effort/Motivation: Good   She did not appear to have difficulty seeing, hearing, or understanding test items/questions but did require additional prompting on most measures (e.g. repeated questions/instructions). She exhibited reduced distress tolerance (e.g. mildly frustrated) on questions she did not know or tasks that were more difficult.   Tests Administered:  Clock Drawing [employer] Adult Intelligence Scale, 4th Edition (WAIS-IV)  Wechsler Memory Scale, 4th edition, Adult Battery (WMS-IV-A)  Comprehension Attention Battery (CAB)  CAB Continuous Performance Test (CPT)    Test Results:   Initially, an estimation was made for the patient's historical/premorbid cognitive and intellectual functioning.  Taking in account the patient's occupational and academic history as well as psychosocial variables it is estimated that the patient has likely performed somewhere between standard scores of 110 and 120 conservatively as far as historical functioning.  We did we will utilize this T score range is an estimation of her prior functioning.  Initially, we will look at the  patient's performance on a broad range of measures assessing attention and concentration and executive functioning as they play such a significant role in overall cognitive performance.  Initially, the patient was administered the auditory/visual pure reaction time test.  On the visual reaction time test the patient correctly identified 48 of 50 targets with 0 errors of commission and 2 errors of omission.  The patient's average response time was 361 ms.  Both the accuracy and response times were within normal limits.  On the pure auditory reaction time measure, the patient correctly identified 49 of 50 targets with 0 errors of commission and one error of omission.  Average response time was 404 ms.  Again, both her accuracy as well as response times were well within normal limits for pure reaction time measures.  The patient was then administered the discriminate reaction time measures.  On the visual discriminate reaction time test the patient correctly identified 33 of 35 targets with 0 errors of commission and 2 errors of omission.  Her average response time was 408 ms.  Both her accuracy scores and response times were within normal limits.  On the auditory discriminate reaction time the patient correctly identified 35 of 35 targets with 0 errors of commission and 0 errors of omission.  Average response time was 644 ms.  Both accuracy and response  scores were within normal limits.  On the mixed/shift discriminate reaction time measure the patient correctly identified 25 of 30 targets with 1 error of commission and 5 errors of omission.  This accuracy score while within normal limits does indicate 1 incident of loss set.  The patient's average response time was 867 ms which is within normal limits.  The patient was then administered the auditory/visual scan reaction time measure.  On the visual, auditory and mixed modalities of this measure the patient's accuracy scores and response times were all within  normal limits without indication of difficulty with shifting attention or slowed primary auditory or visual information processing speeds.  The patient was then administered the auditory/visual encoding test.  On the auditory forwards, backwards and visual forwards and backwards measures her performances were all within normal limits and there were no indications of either auditory or visual primary encoding without processing deficits.  The patient was then administered the Stroop interference cancellation test.  On the first 4 series of this measure that are a straight focus execute task the patient's performance was within normal limits on all 4 series with no indications of difficulties with focus execute abilities.  The task is then shifted to a target of distracting environment.  The patient handled this targeted Stroop interference quite well without any indication of significant reduction in overall capacity or vulnerability to distraction relative to a normative population.  Finally, the patient was administered the visual monitor continuous performance test.  This is a 15-minute task that is broken down into five 3-minute blocks of time for analysis.  On the first 3 minutes of this measure the patient correctly identified 30 of 30 targets with no errors of commission and no errors of omission.  Her average response time was 408 ms both of which are within normal limits.  The patient's performance stayed steady throughout the next 12 minutes of this task having no more than 1 or 2 errors of omission or errors of commission per 3-minute block of time.  Also, the patient's average response time did not show any significant slowing throughout the entire 15 minutes of the task.  For example by the final 3 minutes of this task the patient was correctly identifying 28 of 30 targets with only 2 areas of omission with an average response time of 553 ms.  This pattern clearly shows the patient's ability to  sustain attention and concentration over time and there were no deficits with regard to sustaining attention.  Composite Score Summary  Scale Sum of Scaled Scores Composite Score Percentile Rank 95% Conf. Interval Qualitative Description  Verbal Comprehension 25 VCI 91 27 86-97 Average  Perceptual Reasoning 29 PRI 98 45 92-104 Average  Working Memory 14 WMI 83 13 77-91 Low Average  Processing Speed 13 PSI 81 10 75-91 Low Average  Full Scale 81 FSIQ 86 18 82-90 Low Average  General Ability 54 GAI 94 34 89-99 Average   The patient was also administered the Wechsler Adult Intelligence Scale-IV.  While the patient's full-scale IQ scores would be inappropriate to interpret as the patient's overall past or current intellectual functioning as the patient has been showing some significant difficulties with cognition after her stroke it is more relevant to look at these measures as estimations of specific areas of deficits secondary to cerebrovascular events or other more recent factors.  The patient produced a full-scale IQ score of 86 which falls at the 18th percentile and in the low average range.  This is significantly below predicted levels based on her education and occupational history and in fact represents a roughly 25 point or more decrease in global performance.  We also calculated the patient's general abilities index score which places less emphasis on the variables most impacted by various acute factors.  The patient produced a general abilities index score of 94 which falls in the 34th percentile and is in the average range.  This performance is still below predicted levels and does suggest some cognitive difficulties or changes beyond her immediate attention and concentration and measures of information processing speed and visual scanning.  Verbal Comprehension Subtests Summary  Subtest Raw Score Scaled Score Percentile Rank Reference Group Scaled Score SEM  Similarities 20 8 25 7  1.08   Vocabulary 39 10 50 11 0.73  Information 9 7 16 8  0.67   The patient produced a verbal comprehension index score of 91 which falls at the 27th percentile and is in the average range.  This is somewhat below predicted levels.  The patient shows deficits with regard to her verbal reasoning and problem-solving abilities as well as her retrieval of general fund of information.  The patient's vocabulary skills appear to be primarily intact.  Perceptual Reasoning Subtests Summary  Subtest Raw Score Scaled Score Percentile Rank Reference Group Scaled Score SEM  Block Design 24 7 16 6  1.04  Matrix Reasoning 20 13 84 11 0.95  Visual Puzzles 11 9 37 7 0.99   The patient produced a perceptual reasoning index score of 98 which falls at the 45th percentile and is in the average range.  This is her best global area of functioning and while it is somewhat below predicted levels is not indicative of any significant difficulty with regards to aspects of visual reasoning and problem solving or visual estimation and judgment.  The patient did have some difficulties with regard to visual analysis and organizational abilities.  The patient was also administered the clock drawing test and her performance on the clock drawing test was within normal limits without indication of visual constructional deficits. Clock Drawing Test   WNL     Working Memory Subtests Summary  Subtest Raw Score Scaled Score Percentile Rank Reference Group Scaled Score SEM  Digit Span 23 8 25 7  0.85  Arithmetic 9 6 9 6  1.04   The patient produced a working memory index score of 83 which falls to 13 percentile and is in the low average range.  This performance is in contrast to the auditory and visual encoding measures from the comprehensive attention battery in which she performed well within normal limits.  The patient did better on pure auditory encoding but when she was asked to do more processing of that information she had  difficulty.   Processing Speed Subtests Summary  Subtest Raw Score Scaled Score Percentile Rank Reference Group Scaled Score SEM  Symbol Search 24 8 25 7  1.31  Coding 33 5 5 3  0.99    The patient produced a processing speed index score of 81 which falls at the 10th percentile and is in the low average range.  The patient did much better on measures allowing her to scan vertically versus scanning horizontally.  There may be some issues with motor function with the eyes that have special emphasis placed on these 2 measures.  For example on the comprehensive attention battery measures that do not require a great deal of visual scanning or searching components the patient performed quite well and within  normal limits for overall information processing speed.  Index Score Summary  Index Sum of Scaled Scores Index Score Percentile Rank 95% Confidence Interval Qualitative Descriptor  Auditory Memory (AMI) 37 95 37 89-101 Average  Visual Memory (VMI) 45 107 68 101-112 Average  Visual Working Memory (VWMI) 16 88 21 82-96 Low Average  Immediate Memory (IMI) 38 96 39 90-102 Average  Delayed Memory (DMI) 44 107 68 100-113 Average    The patient was then administered the Wechsler Memory Scale-IV.  The patient showed performances that were generally in the average range for both immediate and delayed recall with significant improvements for delayed memory recall as she produced a delayed memory index score of 107.  The patient's immediate memory index score was 96 both of which in the average range.  The patient did show average performance and breaking her memory down between auditory and visual memory functions with visual memory performing significantly better than auditory but both of them were in the average range.  This overall pattern strongly suggest that the information that is initially learned is stable and available for recall at a later time.  The patient also did well on recognition and cued  recall formats further indicating that information is being stored and organized appropriately.  ABILITY-MEMORY ANALYSIS  Ability Score:  GAI: 94 Date of Testing:  WAIS-IV; WMS-IV 2019/10/26  Predicted Difference Method   Index Predicted WMS-IV Index Score Actual WMS-IV Index Score Difference Critical Value  Significant Difference Y/N Base Rate  Auditory Memory 97 95 2 8.95 N   Visual Memory 96 107 -11 8.82 Y 20%  Visual Working Memory 96 88 8 11.24 N   Immediate Memory 96 96 0 10.35 N   Delayed Memory 97 107 -10 10.08 N    To further analyze the patient's memory functions we also conducted the abilities memory analysis utilizing the patient's general abilities index score of 94 to create a predicted score of how the patient should be performing on various memory task and comparing them to actual achieved performance.  On almost all memory task assessed both auditory and visual the patient performed equal to or better than predicted levels based on her general abilities index score.  The one exception was a weakness with regard to her visual working memory/visual encoding.  And again, on other auditory and visual encoding measures the patient did fairly well (comprehensive attention battery) and this would indicate more of a waxing and waning of attention rather than a fundamental inability to encode either auditory or visual information.  Summary of Results:   Overall, the results of the current neuropsychological evaluation are consistent with some residual effects of her stroke primarily affecting visual scanning and visual searching abilities, a global reduction in overall cognitive performances relative to predicted levels and changes in visual analysis and organization but visual reasoning and problem solving been quite well maintained.  There were no indications of significant expressive language deficits and no indications of significant attention and concentration deficits.  Overall  information processing speed was quite good with the exception of visual scanning and visual searching measures.  The patient's memory functions did not show any consistent pattern of significant deficits and there was some waxing and waning of encoding abilities between 2 separate testing days but overall her attention and concentration appeared to be well within normal limits.  The patient had more difficulties with multi processing types of attentional measures then she did primary encoding functions.  Impression/Diagnosis:   Overall, the  results of the current neuropsychological evaluation are primarily consistent with residual/late effects of her MCA territory infarct from December 2019 primarily affecting the right M2 branch (sylvian segment).  There are no indications of left hemisphere involvement in her weaknesses or deficits.  This region of the brain can affect visual scanning and visual searching abilities and some visual spatial aspects that are noted.  There did not appear to be any left-sided visual neglect noted.  There were clearly no impairments of alertness identified.  However, the patient does have some significant sensorimotor changes in the patient's identifications of leg movement and sensation when sleeping are likely directly related to her cerebrovascular event.  While the patient has some reduction overall cognitive functioning it is primarily related to visual scanning and searching weaknesses and difficulties with retrieval of learned information but the patient's overall memory and learning capacities appear to be within normal limits.  There does not appear to be significant right temporal lobe involvement in her impairments.  There does not appear to be any indications consistent with various degenerative cortically mediated dementia although we will need to do repeat testing in approximately 9 months to fully rule that out.  Again, I think that the biggest aspect as far as  objective cognitive deficits are directly related to her M2 branch right infarction and most of her symptoms can be explained by that.  However, the waxing and waning of some of the attentional issues and some of her other difficulties with changes in mood and other cognitive difficulties that have developed since her stroke may be directly related to sustained sleep disturbance due to obstructive sleep apnea.  I hope by now but the patient has begun regularly using her CPAP device as she may still experience some improvement in some of the changes she is noted since her stroke.  I will provide feedback to the patient regarding the results of the current neuropsychological evaluation and will set up repeat testing in about 9 months if the patient continues to describe subjective symptoms related to further deterioration in functioning.  Diagnosis:    Axis I: Late effects of CVA (cerebrovascular accident)  Memory deficit after cerebral infarction  Sjogren's syndrome, with unspecified organ involvement ([place])  Cognitive and neurobehavioral dysfunction   Ilean Skill, Psy.D. Neuropsychologist

## 2019-11-16 NOTE — Progress Notes (Signed)
Loop recorder in place last device check 10/19/19 in epic Last ekg 11/2017 IN Epic ECHO 10/21/2018 in epic

## 2019-11-17 ENCOUNTER — Other Ambulatory Visit (HOSPITAL_COMMUNITY)
Admission: RE | Admit: 2019-11-17 | Discharge: 2019-11-17 | Disposition: A | Discharge status: Home / Self Care | Payer: BC Managed Care – PPO | Source: Ambulatory Visit | Attending: Gastroenterology | Admitting: Gastroenterology

## 2019-11-17 DIAGNOSIS — Z01812 Encounter for preprocedural laboratory examination: Secondary | ICD-10-CM | POA: Diagnosis not present

## 2019-11-17 DIAGNOSIS — Z20822 Contact with and (suspected) exposure to covid-19: Secondary | ICD-10-CM | POA: Diagnosis not present

## 2019-11-17 LAB — SARS CORONAVIRUS 2 (TAT 6-24 HRS): SARS Coronavirus 2: NEGATIVE

## 2019-11-19 ENCOUNTER — Ambulatory Visit (INDEPENDENT_AMBULATORY_CARE_PROVIDER_SITE_OTHER): Payer: BC Managed Care – PPO | Admitting: *Deleted

## 2019-11-19 DIAGNOSIS — I63511 Cerebral infarction due to unspecified occlusion or stenosis of right middle cerebral artery: Secondary | ICD-10-CM | POA: Diagnosis not present

## 2019-11-19 LAB — CUP PACEART REMOTE DEVICE CHECK
Date Time Interrogation Session: 20210210231459
Implantable Pulse Generator Implant Date: 20200114

## 2019-11-19 NOTE — Progress Notes (Signed)
ILR Remote 

## 2019-11-20 ENCOUNTER — Ambulatory Visit (HOSPITAL_COMMUNITY): Payer: BC Managed Care – PPO | Admitting: Anesthesiology

## 2019-11-20 ENCOUNTER — Encounter (HOSPITAL_COMMUNITY): Payer: Self-pay | Admitting: Gastroenterology

## 2019-11-20 ENCOUNTER — Ambulatory Visit (HOSPITAL_COMMUNITY)
Admission: RE | Admit: 2019-11-20 | Discharge: 2019-11-20 | Disposition: A | Discharge status: Home / Self Care | Payer: BC Managed Care – PPO | Attending: Gastroenterology | Admitting: Gastroenterology

## 2019-11-20 ENCOUNTER — Encounter (HOSPITAL_COMMUNITY)
Admission: RE | Disposition: A | Discharge status: Home / Self Care | Payer: Self-pay | Source: Home / Self Care | Attending: Gastroenterology

## 2019-11-20 ENCOUNTER — Other Ambulatory Visit: Payer: Self-pay

## 2019-11-20 DIAGNOSIS — F329 Major depressive disorder, single episode, unspecified: Secondary | ICD-10-CM | POA: Diagnosis not present

## 2019-11-20 DIAGNOSIS — Z888 Allergy status to other drugs, medicaments and biological substances status: Secondary | ICD-10-CM | POA: Insufficient documentation

## 2019-11-20 DIAGNOSIS — I10 Essential (primary) hypertension: Secondary | ICD-10-CM | POA: Insufficient documentation

## 2019-11-20 DIAGNOSIS — Z90710 Acquired absence of both cervix and uterus: Secondary | ICD-10-CM | POA: Insufficient documentation

## 2019-11-20 DIAGNOSIS — K254 Chronic or unspecified gastric ulcer with hemorrhage: Secondary | ICD-10-CM | POA: Insufficient documentation

## 2019-11-20 DIAGNOSIS — Z6828 Body mass index (BMI) 28.0-28.9, adult: Secondary | ICD-10-CM | POA: Insufficient documentation

## 2019-11-20 DIAGNOSIS — K2950 Unspecified chronic gastritis without bleeding: Secondary | ICD-10-CM | POA: Diagnosis not present

## 2019-11-20 DIAGNOSIS — K222 Esophageal obstruction: Secondary | ICD-10-CM | POA: Insufficient documentation

## 2019-11-20 DIAGNOSIS — Z87891 Personal history of nicotine dependence: Secondary | ICD-10-CM | POA: Diagnosis not present

## 2019-11-20 DIAGNOSIS — Z811 Family history of alcohol abuse and dependence: Secondary | ICD-10-CM | POA: Diagnosis not present

## 2019-11-20 DIAGNOSIS — K449 Diaphragmatic hernia without obstruction or gangrene: Secondary | ICD-10-CM | POA: Diagnosis not present

## 2019-11-20 DIAGNOSIS — K3189 Other diseases of stomach and duodenum: Secondary | ICD-10-CM | POA: Insufficient documentation

## 2019-11-20 DIAGNOSIS — Z79899 Other long term (current) drug therapy: Secondary | ICD-10-CM | POA: Diagnosis not present

## 2019-11-20 DIAGNOSIS — D649 Anemia, unspecified: Secondary | ICD-10-CM | POA: Diagnosis not present

## 2019-11-20 DIAGNOSIS — Z803 Family history of malignant neoplasm of breast: Secondary | ICD-10-CM | POA: Insufficient documentation

## 2019-11-20 DIAGNOSIS — F419 Anxiety disorder, unspecified: Secondary | ICD-10-CM | POA: Insufficient documentation

## 2019-11-20 DIAGNOSIS — K5730 Diverticulosis of large intestine without perforation or abscess without bleeding: Secondary | ICD-10-CM | POA: Insufficient documentation

## 2019-11-20 DIAGNOSIS — R0602 Shortness of breath: Secondary | ICD-10-CM | POA: Insufficient documentation

## 2019-11-20 DIAGNOSIS — R634 Abnormal weight loss: Secondary | ICD-10-CM | POA: Insufficient documentation

## 2019-11-20 DIAGNOSIS — Z7982 Long term (current) use of aspirin: Secondary | ICD-10-CM | POA: Insufficient documentation

## 2019-11-20 DIAGNOSIS — M3500 Sicca syndrome, unspecified: Secondary | ICD-10-CM | POA: Insufficient documentation

## 2019-11-20 DIAGNOSIS — Z8249 Family history of ischemic heart disease and other diseases of the circulatory system: Secondary | ICD-10-CM | POA: Diagnosis not present

## 2019-11-20 DIAGNOSIS — Z833 Family history of diabetes mellitus: Secondary | ICD-10-CM | POA: Insufficient documentation

## 2019-11-20 DIAGNOSIS — G4700 Insomnia, unspecified: Secondary | ICD-10-CM | POA: Insufficient documentation

## 2019-11-20 DIAGNOSIS — K259 Gastric ulcer, unspecified as acute or chronic, without hemorrhage or perforation: Secondary | ICD-10-CM | POA: Diagnosis not present

## 2019-11-20 DIAGNOSIS — D509 Iron deficiency anemia, unspecified: Secondary | ICD-10-CM | POA: Insufficient documentation

## 2019-11-20 DIAGNOSIS — Z8673 Personal history of transient ischemic attack (TIA), and cerebral infarction without residual deficits: Secondary | ICD-10-CM | POA: Insufficient documentation

## 2019-11-20 DIAGNOSIS — K5790 Diverticulosis of intestine, part unspecified, without perforation or abscess without bleeding: Secondary | ICD-10-CM | POA: Diagnosis not present

## 2019-11-20 HISTORY — PX: BIOPSY: SHX5522

## 2019-11-20 HISTORY — PX: ESOPHAGOGASTRODUODENOSCOPY (EGD) WITH PROPOFOL: SHX5813

## 2019-11-20 HISTORY — PX: COLONOSCOPY WITH PROPOFOL: SHX5780

## 2019-11-20 HISTORY — DX: Decreased white blood cell count, unspecified: D72.819

## 2019-11-20 HISTORY — DX: Anemia, unspecified: D64.9

## 2019-11-20 HISTORY — DX: Presence of spectacles and contact lenses: Z97.3

## 2019-11-20 LAB — CBC
HCT: 18.9 % — ABNORMAL LOW (ref 36.0–46.0)
Hemoglobin: 6.4 g/dL — CL (ref 12.0–15.0)
MCH: 36 pg — ABNORMAL HIGH (ref 26.0–34.0)
MCHC: 33.9 g/dL (ref 30.0–36.0)
MCV: 106.2 fL — ABNORMAL HIGH (ref 80.0–100.0)
Platelets: 276 10*3/uL (ref 150–400)
RBC: 1.78 MIL/uL — ABNORMAL LOW (ref 3.87–5.11)
RDW: 18.1 % — ABNORMAL HIGH (ref 11.5–15.5)
WBC: 1.3 10*3/uL — CL (ref 4.0–10.5)
nRBC: 0 % (ref 0.0–0.2)

## 2019-11-20 LAB — BASIC METABOLIC PANEL
Anion gap: 11 (ref 5–15)
BUN: 16 mg/dL (ref 8–23)
CO2: 27 mmol/L (ref 22–32)
Calcium: 9.2 mg/dL (ref 8.9–10.3)
Chloride: 100 mmol/L (ref 98–111)
Creatinine, Ser: 1.67 mg/dL — ABNORMAL HIGH (ref 0.44–1.00)
GFR calc Af Amer: 38 mL/min — ABNORMAL LOW (ref >60)
GFR calc non Af Amer: 32 mL/min — ABNORMAL LOW (ref >60)
Glucose, Bld: 96 mg/dL (ref 70–99)
Potassium: 3.3 mmol/L — ABNORMAL LOW (ref 3.5–5.1)
Sodium: 138 mmol/L (ref 135–145)

## 2019-11-20 LAB — ABO/RH: ABO/RH(D): O POS

## 2019-11-20 SURGERY — ESOPHAGOGASTRODUODENOSCOPY (EGD) WITH PROPOFOL
Anesthesia: Monitor Anesthesia Care

## 2019-11-20 MED ORDER — SODIUM CHLORIDE 0.9% IV SOLUTION: Freq: Once | INTRAVENOUS | Status: DC

## 2019-11-20 MED ORDER — PROPOFOL 500 MG/50ML IV EMUL
INTRAVENOUS | Status: DC | PRN
  Administered 2019-11-20: 125 ug/kg/min via INTRAVENOUS

## 2019-11-20 MED ORDER — PROPOFOL 500 MG/50ML IV EMUL
INTRAVENOUS | Status: AC
  Filled 2019-11-20: qty 50

## 2019-11-20 MED ORDER — LACTATED RINGERS IV SOLN
INTRAVENOUS | Status: AC | PRN
  Administered 2019-11-20: 1000 mL via INTRAVENOUS

## 2019-11-20 MED ORDER — ALBUMIN HUMAN 5 % IV SOLN: INTRAVENOUS | Status: DC | PRN

## 2019-11-20 MED ORDER — LIDOCAINE 2% (20 MG/ML) 5 ML SYRINGE
INTRAMUSCULAR | Status: DC | PRN
  Administered 2019-11-20: 60 mg via INTRAVENOUS

## 2019-11-20 MED ORDER — PROPOFOL 10 MG/ML IV BOLUS
INTRAVENOUS | Status: DC | PRN
  Administered 2019-11-20: 20 mg via INTRAVENOUS

## 2019-11-20 MED ORDER — ALBUMIN HUMAN 5 % IV SOLN
INTRAVENOUS | Status: AC
  Filled 2019-11-20: qty 250

## 2019-11-20 MED ORDER — SODIUM CHLORIDE 0.9 % IV SOLN: INTRAVENOUS | Status: DC

## 2019-11-20 MED ORDER — PHENYLEPHRINE HCL-NACL 10-0.9 MG/250ML-% IV SOLN
INTRAVENOUS | Status: DC | PRN
  Administered 2019-11-20: 20 ug/min via INTRAVENOUS

## 2019-11-20 MED ORDER — PANTOPRAZOLE SODIUM 40 MG PO TBEC: 40.0000 mg | DELAYED_RELEASE_TABLET | Freq: Every day | ORAL | 1 refills | Status: AC

## 2019-11-20 SURGICAL SUPPLY — 24 items

## 2019-11-20 NOTE — H&P (Signed)
History of Present Illness  General:  This was an audio and visual visit using zoom that started at 11:05 AM until 11:21 AM. Labs from 11/09/19 showed BUN 13, creatinine 1.29, GFR 51, hemoglobin 7.9, MCV 102.7, platelet 402, WBC 1.6. Ferritin elevated at 435.9, iron panel showed saturation of 48 percent, TIBC 276, vitamin B12 more than 1500. Labs from 10/12/19 showed hemoglobin 10.2, MCV hundred .7, platelet 336, WBC 2.1 CMP showed GFR of 54 otherwise unremarkable. Normal TSH. In 01/2019 oh hemoglobin was normal at 12.2, MCV 96.1 and platelet 337. Colonoscopy from 2010 by Dr. Amedeo Plenty for screening was reported as normal except diverticulosis in the sigmoid. Prior notes from Dr. Amedeo Plenty states she had liver biopsy at [facility] and was on as a prior trend for short period of time for autoimmune hepatitis. In the past her ASMA have been negative but she has had elevated AMA. She has a history of Sjogren's syndrome and complains of a dry mouth. Patient states that she has not had any blood in the stool or black stools, denies vomiting blood, denies bleeding from gums. She reports that she has mucous in the sinuses and me sees small amount of blood when she cleans the nose in the morning. Her last colonoscopy was with the [facility] about 2 years ago, she had a few polyps removed and was recommended to repeat in 5 years. For the last 2 months she has developed worsening exertional shortness of breath, feeling tired and having lack of energy. She is also develop bloody vision on a few occasions. Her blood pressure has been as low as 74/55 mmHg, yesterday was 98/60 mm Hg. she has had 23 pounds of unintentional weight loss in the last 2 months. She feels where she forces herself to eat and has early satiety without associated bloating, acid reflux, heartburn, nausea, vomiting, difficulty swallowing or pain on swallowing. She denies abdominal or rectal pain. There is no family history of colon cancer celiac disease. Denies use of  any immunosuppressants. She has a bowel movement once in 3 days, while she was on iron pills a stools were hard and black. She has stopped taking iron for the last 2 days and she was found to have elevated ferritin.   Current Medications  Taking   Metoprolol Succinate 50 MG Tablet Extended Release 24 Hour 1 tablet Orally Once a day   Welchol(Colesevelam HCl) 625 MG Tablet 3 tablets with meals by mouth Twice a day   Atorvastatin Calcium 40 MG Tablet 1 tablet Orally Once a day   Tribenzor(Olmesartan-amLODIPine-HCTZ) 40-10-25 MG Tablet 1 tablet by mouth Once a day   Aripiprazole 2 MG Tablet 1 tablet Orally Once a day   Citalopram Hydrobromide 20 MG Tablet 1 tablet Orally Once a day   Aspirin EC 81 MG Tablet Delayed Release 1 tablet Orally Once a day, Notes: Hospital   Eszopiclone 3 MG Tablet 1 tablet immediately before bedtime Orally Once a day   Vitamin B12 1000 MCG Tablet Extended Release 1 tablet Orally Once a day   Not-Taking   Iron (Ferrous Sulfate) 325 (65 Fe) MG Tablet 1 tablet Orally Once a day   Medication List reviewed and reconciled with the patient    Past Medical History  Hypertension.   depression in [facility].   Insomnia.   BOOP 2011 stable.   autoimmune hepatitis 2011-tapered off Imuran 01/2012--Dr. Marcy Siren GI, no longer seeing.   Connective tissue disease-Sjogrens-ANA+, SSA+, SSB+, joint pain Dr. Dossie Der not  being seen at present .   Anxiety.   DEXA 12/17/2018 T score spine -1.9 and hip -1.7 .    Surgical History  hysterectomy ovaries intact , surgery due to menstrual issues and benign tumors   appendectomy   liver bx summer 2011   lung bx summer 2011 [facility]   left knee scope 01/2013  left knee unicompartmental surgery Dr. Berenice Primas 2016  Colonoscopy 2019   Family History  Father: deceased 31 yrs, hypertension, cholesterol, emphysema, heart attack, cirrhosis of liver  Mother: deceased 38 yrs, CAD, one lung due to hole , CHF COPD   Paternal Grand Father: deceased  Paternal Grand Mother: deceased  Maternal Grand Father: deceased  Maternal Grand Mother: deceased, cancer, breast  Brother 1: deceased 28 yrs, complications from [place]: alive, acute renal failure due to alcohol, recovering, now doing well  Brother 3: alive, healthy  Sister 1: alive, diabetes, smoker, diagnosed with Diabetes  Sister 2: alive, smoker  3 brother(s) , 2 sister(s) .   no family hx colon cancer, colon polyps, liver dx - no children.   Social History  General:  Tobacco use  cigarettes: Former smoker Quit in year 2002 Pack-year Hx: 15 Tobacco history last updated 11/09/2019 no EXPOSURE TO PASSIVE SMOKE.  no Alcohol.  Caffeine: very little.  no Recreational drug use.  DIET: low fat.  Exercise: twice a week, [employer].  DENTAL CARE: twice a year.  Marital Status: Single,.  Children: none.  EDUCATION: [employer], [employer].  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  COMMUNICATION BARRIERS: no hearing, vision or cognition issues.  Religion: Clarissa.  Seat belt use: always.  Tobacco Exposure: none.    Allergies  Prednisone: wt gain, facial hair growth, depressed   Hospitalization/Major Diagnostic Procedure  2 hospitalizations in summer 2011   surgery on knee   cva 09/29/2018  none in the past year 11/2019   Review of Systems  GI PROCEDURE:  no Pacemaker/ AICD, no. no Artificial heart valves. no MI/heart attack. no Abnormal heart rhythm. no Angina. CVA YES. Hypertension YES. no Hypotension. no Asthma, COPD. Sleep apnea YES. no Seizure disorders. no Artificial joints. no Severe DJD. no Diabetes. no Significant headaches. no Vertigo. Depression/anxiety YES. no Abnormal bleeding. no Kidney Disease. no Liver disease, no. no Chance of pregnancy. no Blood transfusion. no Method of Birth Control. no Birth control pills.      Vital Signs  Wt 165, Wt change -3.6 lb, Ht  63.5, BMI 28.77.   Examination  Gastroenterology:: GENERAL APPEARANCE: Well developed, well nourished, no active distress, pleasant.  SCLERA: anicteric.  RESPIRATORY able to speak in full sentences, not in respiratory distress .  NEURO: Alert, oriented to time, place and person.  PSYCH: mood/affect normal.  This was an audio and visual visit.    Assessments     1. Anemia, unspecified type - D64.9 (Primary)   Treatment  1. Anemia, unspecified type  IMAGING: Colonoscopy    Powell,Amy 11/12/2019 11:40:55 AM > Pt scheduled EGD/Colon at [facility] - spoke with [facility] # [phone]. Mailed pt instructions, rx, consents and bill of rights.  IMAGING: Esophagoscopy    Powell,Amy 11/12/2019 11:40:55 AM > Pt scheduled EGD/Colon at [facility] - spoke with [facility] # [phone]. Mailed pt instructions, rx, consents and bill of rights.   Notes: Severe symptomatic anemia without obvious hematochezia/melena.  Recommend diagnostic endoscopy and colonoscopy.  To be performed as outpatient at [facility], in case patient has AVMs as  APC will be available.  Discussed about the risks and benefits of the procedure.  The patient understands and verbalizes consent.  She will be given written instructions, prescription for preparation and will be scheduled for the same.  Has an appointment with a hematologist.     Ronnette Juniper, MD

## 2019-11-20 NOTE — Op Note (Signed)
[facility] Patient Name: Breanna Perry Procedure Date: 11/20/2019 MRN: [id number] Attending MD: Ronnette Juniper , MD Date of Birth: [birth year] CSN: [id number] Age: 64 Admit Type: Outpatient Procedure:                Colonoscopy Indications:              Last colonoscopy within the past 3 years,                            Unexplained iron deficiency anemia, Weight loss,                            (was hypotensive pre-procedure,was given IV albumin                            X 2,Hb was 6.4 and was given 2 units PRBC                            transfusion during the procedure) Providers:                Ronnette Juniper, MD, Grace Isaac, RN, Lina Sar,                            Technician,Melanie Good CRNA Referring MD:             Dr. Jeremy Johann (PCP) Medicines:                Monitored Anesthesia Care Complications:            No immediate complications. Estimated Blood Loss:     Estimated blood loss: none. Procedure:                Pre-Anesthesia Assessment:                           - Prior to the procedure, a History and Physical                            was performed, and patient medications and                            allergies were reviewed. The patient's tolerance of                            previous anesthesia was also reviewed. The risks                            and benefits of the procedure and the sedation                            options and risks were discussed with the patient.                            All questions were answered, and informed consent  was obtained. Prior Anticoagulants: The patient has                            taken no previous anticoagulant or antiplatelet                            agents except for aspirin. ASA Grade Assessment:                            III - A patient with severe systemic disease. After                            reviewing the risks and benefits, the patient was                  deemed in satisfactory condition to undergo the                            procedure.                           After obtaining informed consent, the colonoscope                            was passed under direct vision. Throughout the                            procedure, the patient's blood pressure, pulse, and                            oxygen saturations were monitored continuously. The                            [id number] Olympus pediatric colonscope                            was introduced through the anus and advanced to the                            the terminal ileum. The colonoscopy was performed                            without difficulty. The patient tolerated the                            procedure well. The quality of the bowel                            preparation was good. Scope In: 11:40:45 AM Scope Out: 11:55:27 AM Scope Withdrawal Time: 0 hours 7 minutes 58 seconds  Total Procedure Duration: 0 hours 14 minutes 42 seconds  Findings:      The perianal and digital rectal examinations were normal.      The terminal ileum appeared normal.      Multiple small and large-mouthed diverticula were found in the sigmoid  colon, descending colon, transverse colon and hepatic flexure.      The exam was otherwise without abnormality.      The retroflexed view of the distal rectum and anal verge was normal and       showed no anal or rectal abnormalities. Impression:               - The examined portion of the ileum was normal.                           - Diverticulosis in the sigmoid colon, in the                            descending colon, in the transverse colon and at                            the hepatic flexure.                           - The examination was otherwise normal.                           - The distal rectum and anal verge are normal on                            retroflexion view.                           - No specimens  collected. Moderate Sedation:      Patient did not receive moderate sedation for this procedure, but       instead received monitored anesthesia care. Recommendation:           - Patient has a contact number available for                            emergencies. The signs and symptoms of potential                            delayed complications were discussed with the                            patient. Return to normal activities tomorrow.                            Written discharge instructions were provided to the                            patient.                           - Resume regular diet.                           - Continue present medications.                           - Repeat colonoscopy in 10  years for screening                            purposes.                           - Hematology workup for unexplained worsening                            anemia. Procedure Code(s):        --- Professional ---                           [phone], Colonoscopy, flexible; diagnostic, including                            collection of specimen(s) by brushing or washing,                            when performed (separate procedure) Diagnosis Code(s):        --- Professional ---                           D50.9, Iron deficiency anemia, unspecified                           R63.4, Abnormal weight loss                           K57.30, Diverticulosis of large intestine without                            perforation or abscess without bleeding CPT copyright 2019 American Medical Association. All rights reserved. The codes documented in this report are preliminary and upon coder review may  be revised to meet current compliance requirements. Ronnette Juniper, MD 11/20/2019 12:07:49 PM This report has been signed electronically. Number of Addenda: 0

## 2019-11-20 NOTE — Anesthesia Preprocedure Evaluation (Addendum)
Anesthesia Evaluation  Patient identified by MRN, date of birth, ID band Patient awake    Reviewed: Allergy & Precautions, NPO status , Patient's Chart, lab work & pertinent test results  Airway Mallampati: III  TM Distance: >3 FB Neck ROM: Full  Mouth opening: Limited Mouth Opening  Dental   Pulmonary former smoker,    breath sounds clear to auscultation       Cardiovascular hypertension, Pt. on medications and Pt. on home beta blockers  Rhythm:Regular Rate:Normal     Neuro/Psych CVA    GI/Hepatic Neg liver ROS, GI bleed   Endo/Other  negative endocrine ROS  Renal/GU negative Renal ROS     Musculoskeletal   Abdominal   Peds  Hematology  (+) anemia ,   Anesthesia Other Findings   Reproductive/Obstetrics                            Anesthesia Physical Anesthesia Plan  ASA: III  Anesthesia Plan: MAC   Post-op Pain Management:    Induction: Intravenous  PONV Risk Score and Plan: 2 and Ondansetron, Propofol infusion and Treatment may vary due to age or medical condition  Airway Management Planned: Natural Airway and Nasal Cannula  Additional Equipment:   Intra-op Plan:   Post-operative Plan:   Informed Consent: I have reviewed the patients History and Physical, chart, labs and discussed the procedure including the risks, benefits and alternatives for the proposed anesthesia with the patient or authorized representative who has indicated his/her understanding and acceptance.       Plan Discussed with:   Anesthesia Plan Comments:         Anesthesia Quick Evaluation

## 2019-11-20 NOTE — Discharge Instructions (Signed)

## 2019-11-20 NOTE — Anesthesia Postprocedure Evaluation (Signed)
Anesthesia Post Note  Patient: Breanna Perry  Procedure(s) Performed: ESOPHAGOGASTRODUODENOSCOPY (EGD) WITH PROPOFOL (N/A ) COLONOSCOPY WITH PROPOFOL (N/A ) BIOPSY     Patient location during evaluation: PACU Anesthesia Type: MAC Level of consciousness: awake and alert Pain management: pain level controlled Vital Signs Assessment: post-procedure vital signs reviewed and stable Respiratory status: spontaneous breathing, nonlabored ventilation, respiratory function stable and patient connected to nasal cannula oxygen Cardiovascular status: stable and blood pressure returned to baseline Postop Assessment: no apparent nausea or vomiting Anesthetic complications: no    Last Vitals:  Vitals:   11/20/19 1320 11/20/19 1325  BP: (!) 98/53 (!) 99/54  Pulse: 70 70  Resp: (!) 23 (!) 24  Temp:    SpO2: 92% 93%    Last Pain:  Vitals:   11/20/19 1235  TempSrc: Oral  PainSc:                  Tiajuana Amass

## 2019-11-20 NOTE — Op Note (Signed)
[facility] Patient Name: Breanna Perry Procedure Date: 11/20/2019 MRN: [id number] Attending MD: Ronnette Juniper , MD Date of Birth: May 15, 1956 CSN: [id number] Age: 64 Admit Type: Outpatient Procedure:                Upper GI endoscopy Indications:              Unexplained iron deficiency anemia, Weight                            loss,(was hypotensive pre-procedure and needed IV                            albumin, HB was low at 6.4 and was given 2 units                            PRBC during the procedure) Providers:                Ronnette Juniper, MD, Grace Isaac, RN, Lina Sar,                            Technician,Melanie Good CRNA Referring MD:             Dr. Jeremy Johann (PCP) Medicines:                Monitored Anesthesia Care Complications:            No immediate complications. Estimated blood loss:                            Minimal. Estimated Blood Loss:     Estimated blood loss was minimal. Procedure:                Pre-Anesthesia Assessment:                           - Prior to the procedure, a History and Physical                            was performed, and patient medications and                            allergies were reviewed. The patient's tolerance of                            previous anesthesia was also reviewed. The risks                            and benefits of the procedure and the sedation                            options and risks were discussed with the patient.                            All questions were answered, and informed consent  was obtained. Prior Anticoagulants: The patient has                            taken no previous anticoagulant or antiplatelet                            agents except for aspirin. ASA Grade Assessment:                            III - A patient with severe systemic disease. After                            reviewing the risks and benefits, the patient was            deemed in satisfactory condition to undergo the                            procedure.                           After obtaining informed consent, the endoscope was                            passed under direct vision. Throughout the                            procedure, the patient's blood pressure, pulse, and                            oxygen saturations were monitored continuously. The                            [id number] [id number] Olympus gastroscope was                            introduced through the mouth, and advanced to the                            second part of duodenum. The upper GI endoscopy was                            accomplished without difficulty. The patient                            tolerated the procedure well. Findings:      A widely patent Schatzki ring was found at the gastroesophageal junction.      A 3 cm hiatal hernia was present.      The examined esophagus was normal.      Multiple localized small erosions with stigmata of recent bleeding were       found in the gastric body and in the gastric antrum.      Localized mildly erythematous mucosa without bleeding was found in the       gastric antrum. Biopsies were taken with a cold forceps for Helicobacter       pylori testing.  The cardia and gastric fundus were normal on retroflexion.      The examined duodenum was normal. Biopsies for histology were taken with       a cold forceps for evaluation of celiac disease. Impression:               - Widely patent Schatzki ring.                           - 3 cm hiatal hernia.                           - Normal esophagus.                           - Erosive gastropathy with stigmata of recent                            bleeding.                           - Erythematous mucosa in the antrum. Biopsied.                           - Normal examined duodenum. Biopsied. Moderate Sedation:      Patient did not receive moderate sedation for this procedure,  but       instead received monitored anesthesia care. Recommendation:           - Patient has a contact number available for                            emergencies. The signs and symptoms of potential                            delayed complications were discussed with the                            patient. Return to normal activities tomorrow.                            Written discharge instructions were provided to the                            patient.                           - Resume regular diet.                           - Use Protonix (pantoprazole) 40 mg PO daily for 2                            months.                           - Await pathology results.                           -  Perform a colonoscopy today. Procedure Code(s):        --- Professional ---                           [phone], Esophagogastroduodenoscopy, flexible,                            transoral; with biopsy, single or multiple Diagnosis Code(s):        --- Professional ---                           K22.2, Esophageal obstruction                           K44.9, Diaphragmatic hernia without obstruction or                            gangrene                           K92.2, Gastrointestinal hemorrhage, unspecified                           K31.89, Other diseases of stomach and duodenum                           D50.9, Iron deficiency anemia, unspecified                           R63.4, Abnormal weight loss CPT copyright 2019 American Medical Association. All rights reserved. The codes documented in this report are preliminary and upon coder review may  be revised to meet current compliance requirements. Ronnette Juniper, MD 11/20/2019 12:03:54 PM This report has been signed electronically. Number of Addenda: 0

## 2019-11-20 NOTE — Transfer of Care (Signed)
Immediate Anesthesia Transfer of Care Note  Patient: Ladeja Lich  Procedure(s) Performed: ESOPHAGOGASTRODUODENOSCOPY (EGD) WITH PROPOFOL (N/A ) COLONOSCOPY WITH PROPOFOL (N/A ) BIOPSY  Patient Location: PACU and Endoscopy Unit  Anesthesia Type:MAC  Level of Consciousness: awake, alert  and oriented  Airway & Oxygen Therapy: Patient Spontanous Breathing and Patient connected to face mask oxygen  Post-op Assessment: Report given to RN and Post -op Vital signs reviewed and stable  Post vital signs: Reviewed and stable  Last Vitals:  Vitals Value Taken Time  BP 107/68 11/20/19 1205  Temp 36.6 C 11/20/19 1205  Pulse 79 11/20/19 1205  Resp 18 11/20/19 1205  SpO2 98 % 11/20/19 1205    Last Pain:  Vitals:   11/20/19 1205  TempSrc: Oral  PainSc: 0-No pain         Complications: No apparent anesthesia complications

## 2019-11-20 NOTE — Brief Op Note (Signed)
11/20/2019  12:08 PM  PATIENT:  Breanna Perry  64 y.o. female  PRE-OPERATIVE DIAGNOSIS:  Anemia, unspecified type - D64.9  POST-OPERATIVE DIAGNOSIS:  EGD: gastric erosions. no active bleeding. bx taken antrum, duod. Colon: no active bleeding. diverticulosis  PROCEDURE:  Procedure(s): ESOPHAGOGASTRODUODENOSCOPY (EGD) WITH PROPOFOL (N/A) COLONOSCOPY WITH PROPOFOL (N/A) BIOPSY  SURGEON:  Surgeon(s) and Role:    Ronnette Juniper, MD - Primary  PHYSICIAN ASSISTANT:   ASSISTANTS: Grace Isaac, RN and Lina Sar, Tech ANESTHESIA:   MAC  EBL:  Minimal  BLOOD ADMINISTERED:2 units of PRBC CC PRBC  DRAINS: none   LOCAL MEDICATIONS USED:  NONE  SPECIMEN:  Biopsy / Limited Resection  DISPOSITION OF SPECIMEN:  PATHOLOGY  COUNTS:  YES  TOURNIQUET:  * No tourniquets in log *  DICTATION: .Dragon Dictation  PLAN OF CARE: Discharge to home after PACU  PATIENT DISPOSITION:  PACU - hemodynamically stable.   Delay start of Pharmacological VTE agent (>24hrs) due to surgical blood loss or risk of bleeding: not applicable

## 2019-11-22 LAB — TYPE AND SCREEN
ABO/RH(D): O POS
Antibody Screen: NEGATIVE
Unit division: 0
Unit division: 0

## 2019-11-22 LAB — BPAM RBC
Blood Product Expiration Date: 202103172359
Blood Product Expiration Date: 202103172359
ISSUE DATE / TIME: 202102121112
ISSUE DATE / TIME: 202102121112
Unit Type and Rh: 5100
Unit Type and Rh: 5100

## 2019-11-23 ENCOUNTER — Other Ambulatory Visit: Payer: Self-pay

## 2019-11-23 ENCOUNTER — Telehealth: Payer: Self-pay

## 2019-11-23 ENCOUNTER — Other Ambulatory Visit: Payer: Self-pay | Admitting: Oncology

## 2019-11-23 DIAGNOSIS — D649 Anemia, unspecified: Secondary | ICD-10-CM

## 2019-11-23 LAB — SURGICAL PATHOLOGY

## 2019-11-23 NOTE — Telephone Encounter (Signed)
Called patient back and left a message that the next step will be a scheduler calling to provide the time and date of the appointment and to call back with any other questions or concerns.

## 2019-11-23 NOTE — Telephone Encounter (Signed)
-----  Message from Wyatt Portela, MD sent at 11/23/2019  8:42 AM EST ----- Regarding: RE: bone marrow biopsy Ok. I will arrange. Thanks ----- Message ----- From: Scot Dock, RN Sent: 11/23/2019   8:36 AM EST To: Wyatt Portela, MD Subject: bone marrow biopsy                             She called and stated that she would like to move forward with the bone marrow biopsy.

## 2019-11-24 ENCOUNTER — Other Ambulatory Visit: Payer: Self-pay | Admitting: Radiology

## 2019-11-25 ENCOUNTER — Other Ambulatory Visit: Payer: Self-pay | Admitting: Radiology

## 2019-11-26 ENCOUNTER — Ambulatory Visit (HOSPITAL_COMMUNITY): Payer: BC Managed Care – PPO

## 2019-11-27 ENCOUNTER — Ambulatory Visit (HOSPITAL_COMMUNITY): Payer: BC Managed Care – PPO

## 2019-11-27 ENCOUNTER — Other Ambulatory Visit: Payer: Self-pay | Admitting: Radiology

## 2019-11-30 ENCOUNTER — Other Ambulatory Visit: Payer: Self-pay

## 2019-11-30 ENCOUNTER — Ambulatory Visit (HOSPITAL_COMMUNITY)
Admission: RE | Admit: 2019-11-30 | Discharge: 2019-11-30 | Disposition: A | Discharge status: Home / Self Care | Payer: BC Managed Care – PPO | Source: Ambulatory Visit | Attending: Oncology | Admitting: Oncology

## 2019-11-30 ENCOUNTER — Encounter (HOSPITAL_COMMUNITY): Payer: Self-pay

## 2019-11-30 DIAGNOSIS — Z7901 Long term (current) use of anticoagulants: Secondary | ICD-10-CM | POA: Insufficient documentation

## 2019-11-30 DIAGNOSIS — Z79899 Other long term (current) drug therapy: Secondary | ICD-10-CM | POA: Diagnosis not present

## 2019-11-30 DIAGNOSIS — F329 Major depressive disorder, single episode, unspecified: Secondary | ICD-10-CM | POA: Insufficient documentation

## 2019-11-30 DIAGNOSIS — D649 Anemia, unspecified: Secondary | ICD-10-CM

## 2019-11-30 DIAGNOSIS — Z7982 Long term (current) use of aspirin: Secondary | ICD-10-CM | POA: Insufficient documentation

## 2019-11-30 DIAGNOSIS — F419 Anxiety disorder, unspecified: Secondary | ICD-10-CM | POA: Diagnosis not present

## 2019-11-30 DIAGNOSIS — Z87891 Personal history of nicotine dependence: Secondary | ICD-10-CM | POA: Insufficient documentation

## 2019-11-30 DIAGNOSIS — D539 Nutritional anemia, unspecified: Secondary | ICD-10-CM | POA: Diagnosis not present

## 2019-11-30 DIAGNOSIS — D469 Myelodysplastic syndrome, unspecified: Secondary | ICD-10-CM | POA: Diagnosis not present

## 2019-11-30 DIAGNOSIS — M3500 Sicca syndrome, unspecified: Secondary | ICD-10-CM | POA: Insufficient documentation

## 2019-11-30 DIAGNOSIS — I10 Essential (primary) hypertension: Secondary | ICD-10-CM | POA: Insufficient documentation

## 2019-11-30 DIAGNOSIS — Z8673 Personal history of transient ischemic attack (TIA), and cerebral infarction without residual deficits: Secondary | ICD-10-CM | POA: Insufficient documentation

## 2019-11-30 DIAGNOSIS — D72819 Decreased white blood cell count, unspecified: Secondary | ICD-10-CM | POA: Diagnosis not present

## 2019-11-30 LAB — CBC WITH DIFFERENTIAL/PLATELET
Abs Immature Granulocytes: 0.01 10*3/uL (ref 0.00–0.07)
Basophils Absolute: 0 10*3/uL (ref 0.0–0.1)
Basophils Relative: 1 %
Eosinophils Absolute: 0 10*3/uL (ref 0.0–0.5)
Eosinophils Relative: 1 %
HCT: 29 % — ABNORMAL LOW (ref 36.0–46.0)
Hemoglobin: 9.7 g/dL — ABNORMAL LOW (ref 12.0–15.0)
Immature Granulocytes: 1 %
Lymphocytes Relative: 59 %
Lymphs Abs: 1.1 10*3/uL (ref 0.7–4.0)
MCH: 33.6 pg (ref 26.0–34.0)
MCHC: 33.4 g/dL (ref 30.0–36.0)
MCV: 100.3 fL — ABNORMAL HIGH (ref 80.0–100.0)
Monocytes Absolute: 0.1 10*3/uL (ref 0.1–1.0)
Monocytes Relative: 5 %
Neutro Abs: 0.6 10*3/uL — ABNORMAL LOW (ref 1.7–7.7)
Neutrophils Relative %: 33 %
Platelets: 305 10*3/uL (ref 150–400)
RBC: 2.89 MIL/uL — ABNORMAL LOW (ref 3.87–5.11)
RDW: 17.4 % — ABNORMAL HIGH (ref 11.5–15.5)
WBC: 1.8 10*3/uL — ABNORMAL LOW (ref 4.0–10.5)
nRBC: 0 % (ref 0.0–0.2)

## 2019-11-30 MED ORDER — MIDAZOLAM HCL 2 MG/2ML IJ SOLN
INTRAMUSCULAR | Status: AC | PRN
  Administered 2019-11-30 (×2): 1 mg via INTRAVENOUS

## 2019-11-30 MED ORDER — NALOXONE HCL 0.4 MG/ML IJ SOLN
INTRAMUSCULAR | Status: AC
  Filled 2019-11-30: qty 1

## 2019-11-30 MED ORDER — FENTANYL CITRATE (PF) 100 MCG/2ML IJ SOLN
INTRAMUSCULAR | Status: AC | PRN
  Administered 2019-11-30 (×2): 25 ug via INTRAVENOUS

## 2019-11-30 MED ORDER — FLUMAZENIL 0.5 MG/5ML IV SOLN
INTRAVENOUS | Status: AC
  Filled 2019-11-30: qty 5

## 2019-11-30 MED ORDER — SODIUM CHLORIDE 0.9 % IV SOLN: INTRAVENOUS | Status: DC

## 2019-11-30 MED ORDER — LIDOCAINE HCL (PF) 1 % IJ SOLN
INTRAMUSCULAR | Status: AC | PRN
  Administered 2019-11-30: 10 mL via INTRADERMAL

## 2019-11-30 MED ORDER — FENTANYL CITRATE (PF) 100 MCG/2ML IJ SOLN
INTRAMUSCULAR | Status: AC
  Filled 2019-11-30: qty 2

## 2019-11-30 MED ORDER — MIDAZOLAM HCL 2 MG/2ML IJ SOLN
INTRAMUSCULAR | Status: AC
  Filled 2019-11-30: qty 4

## 2019-11-30 NOTE — Discharge Instructions (Addendum)
Bone Marrow Aspiration and Bone Marrow Biopsy, Adult, Care After This sheet gives you information about how to care for yourself after your procedure. Your health care provider may also give you more specific instructions. If you have problems or questions, contact your health care provider. What can I expect after the procedure? After the procedure, it is common to have:  Mild pain and tenderness.  Swelling.  Bruising. Follow these instructions at home: Puncture site care   Follow instructions from your health care provider about how to take care of the puncture site. Make sure you: ? Wash your hands with soap and water before and after you change your bandage (dressing). If soap and water are not available, use hand sanitizer. ? Change your dressing as told by your health care provider.  Check your puncture site every day for signs of infection. Check for: ? More redness, swelling, or pain. ? Fluid or blood. ? Warmth. ? Pus or a bad smell. Activity  Return to your normal activities as told by your health care provider. Ask your health care provider what activities are safe for you.  Do not lift anything that is heavier than 10 lb (4.5 kg), or the limit that you are told, until your health care provider says that it is safe.  Do not drive for 24 hours if you were given a sedative during your procedure. General instructions   Take over-the-counter and prescription medicines only as told by your health care provider.  Do not take baths, swim, or use a hot tub until your health care provider approves. Ask your health care provider if you may take showers. You may only be allowed to take sponge baths.  If directed, put ice on the affected area. To do this: ? Put ice in a plastic bag. ? Place a towel between your skin and the bag. ? Leave the ice on for 20 minutes, 2-3 times a day.  Keep all follow-up visits as told by your health care provider. This is important. Contact a  health care provider if:  Your pain is not controlled with medicine.  You have a fever.  You have more redness, swelling, or pain around the puncture site.  You have fluid or blood coming from the puncture site.  Your puncture site feels warm to the touch.  You have pus or a bad smell coming from the puncture site. Summary  After the procedure, it is common to have mild pain, tenderness, swelling, and bruising.  Follow instructions from your health care provider about how to take care of the puncture site and what activities are safe for you.  Take over-the-counter and prescription medicines only as told by your health care provider.  Contact a health care provider if you have any signs of infection, such as fluid or blood coming from the puncture site. This information is not intended to replace advice given to you by your health care provider. Make sure you discuss any questions you have with your health care provider. Document Revised: 02/10/2019 Document Reviewed: 02/10/2019 Elsevier Patient Education  [place]. Moderate Conscious Sedation, Adult, Care After These instructions provide you with information about caring for yourself after your procedure. Your health care provider may also give you more specific instructions. Your treatment has been planned according to current medical practices, but problems sometimes occur. Call your health care provider if you have any problems or questions after your procedure. What can I expect after the procedure? After your procedure,  it is common:  To feel sleepy for several hours.  To feel clumsy and have poor balance for several hours.  To have poor judgment for several hours.  To vomit if you eat too soon. Follow these instructions at home: For at least 24 hours after the procedure:   Do not: ? Participate in activities where you could fall or become injured. ? Drive. ? Use heavy machinery. ? Drink alcohol. ? Take  sleeping pills or medicines that cause drowsiness. ? Make important decisions or sign legal documents. ? Take care of children on your own.  Rest. Eating and drinking  Follow the diet recommended by your health care provider.  If you vomit: ? Drink water, juice, or soup when you can drink without vomiting. ? Make sure you have little or no nausea before eating solid foods. General instructions  Have a responsible adult stay with you until you are awake and alert.  Take over-the-counter and prescription medicines only as told by your health care provider.  If you smoke, do not smoke without supervision.  Keep all follow-up visits as told by your health care provider. This is important. Contact a health care provider if:  You keep feeling nauseous or you keep vomiting.  You feel light-headed.  You develop a rash.  You have a fever. Get help right away if:  You have trouble breathing. This information is not intended to replace advice given to you by your health care provider. Make sure you discuss any questions you have with your health care provider. Document Revised: 09/06/2017 Document Reviewed: 01/14/2016 Elsevier Patient Education  2020 [employer].

## 2019-11-30 NOTE — Procedures (Signed)
Interventional Radiology Procedure Note  Procedure: CT guided aspirate and core biopsy of right iliac bone Complications: None Recommendations: - Bedrest supine x 1 hrs - Hydrocodone PRN  Pain - Follow biopsy results  Signed,  Shalie Schremp K. Osher Oettinger, MD   

## 2019-11-30 NOTE — H&P (Signed)
Chief Complaint: Patient was seen in consultation today for symptomatic anemia  Referring Physician(s): Wyatt Portela  Supervising Physician: Jacqulynn Cadet  Patient Status: [facility] - Out-pt  History of Present Illness: Breanna Perry is a 64 y.o. female with past medical history of anxiety, HTN, and stroke who is undergoing work-up for symptomatic anemia.  She recently underwent EGD/colonoscopy which did not shows significant source of bleeding.  She now presents for bone marrow biopsy at the request of Dr. Alen Blew after recent transfusion for symptomatic anemia.  Her HgB prior to transfusion was 6.4, WBC 1.3.  Patient assessed in short stay this AM.  She is found to be in her usual state of health.  She reports feeling much better after recent transfusion and denies fever, chills, cough, shortness of breath, nausea, vomiting, abdominal pain, dysuria.   She has been NPO.  She does not take blood thinners.   Past Medical History:  Diagnosis Date  . Anemia   . Anxiety   . Dental bridge present    upper and lower  . Depression   . Hypertension    under control with med., has been on med. x 8 yr.  . Leukocytopenia   . Medial meniscus tear 12/2012   left  . Sjogren's syndrome ([place])   . Stroke ([place])   . Wears glasses    reading    Past Surgical History:  Procedure Laterality Date  . APPENDECTOMY  04/1991  . BIOPSY  11/20/2019   Procedure: BIOPSY;  Surgeon: Ronnette Juniper, MD;  Location: [facility];  Service: Gastroenterology;;  . COLONOSCOPY    . COLONOSCOPY WITH PROPOFOL N/A 11/20/2019   Procedure: COLONOSCOPY WITH PROPOFOL;  Surgeon: Ronnette Juniper, MD;  Location: [facility];  Service: Gastroenterology;  Laterality: N/A;  . ESOPHAGOGASTRODUODENOSCOPY (EGD) WITH PROPOFOL N/A 11/20/2019   Procedure: ESOPHAGOGASTRODUODENOSCOPY (EGD) WITH PROPOFOL;  Surgeon: Ronnette Juniper, MD;  Location: [facility];  Service: Gastroenterology;  Laterality: N/A;  . KNEE ARTHROSCOPY Left 01/07/2013    Procedure: LEFT KNEE ARTHROSCOPY PARTIAL MEDIAL MENISECTOMY CHRONDOPLASTY MEDIAL PLICA RESECTION;  Surgeon: Alta Corning, MD;  Location: [place];  Service: Orthopedics;  Laterality: Left;  . KNEE SURGERY Left 06/22/2015  . LIVER BIOPSY  summer 2011  . LOOP RECORDER INSERTION N/A 10/21/2018   Procedure: LOOP RECORDER INSERTION;  Surgeon: Thompson Grayer, MD;  Location: [place] [facility];  Service: Cardiovascular;  Laterality: N/A;  . LUNG BIOPSY  summer 2011  . PARTIAL HYSTERECTOMY  04/1991   partial  . TEE WITHOUT CARDIOVERSION N/A 10/21/2018   Procedure: TRANSESOPHAGEAL ECHOCARDIOGRAM (TEE);  Surgeon: Elouise Munroe, MD;  Location: [place];  Service: Cardiology;  Laterality: N/A;  possible loop recorder    Allergies: Patient has no known allergies.  Medications: Prior to Admission medications   Medication Sig Start Date End Date Taking? Authorizing Provider  ARIPiprazole (ABILIFY) 2 MG tablet Take 2 mg by mouth daily. 04/02/18  Yes [provider]  aspirin EC 81 MG EC tablet Take 1 tablet (81 mg total) by mouth daily. 10/01/18  Yes Donzetta Starch, NP  atorvastatin (LIPITOR) 40 MG tablet Take 1 tablet (40 mg total) by mouth daily at 6 PM. Patient taking differently: Take 40 mg by mouth daily.  09/30/18  Yes Donzetta Starch, NP  citalopram (CELEXA) 40 MG tablet Take 40 mg by mouth daily.  04/09/17  Yes [provider]  colesevelam (WELCHOL) 625 MG tablet Take 1,875 mg by mouth 2 (two) times daily.  Yes [provider]  Eszopiclone 3 MG TABS Take 3 mg by mouth at bedtime as needed (sleep).  10/02/19  Yes [provider]  metoprolol (TOPROL-XL) 50 MG 24 hr tablet Take 50 mg by mouth daily.    Yes [provider]  Olmesartan-Amlodipine-HCTZ (TRIBENZOR) 40-10-25 MG TABS Take 1 tablet by mouth daily.    Yes [provider]  pantoprazole (PROTONIX) 40 MG tablet Take 1 tablet (40 mg total) by mouth daily. 11/20/19 11/19/20  Yes Ronnette Juniper, MD     Family History  Problem Relation Age of Onset  . Hypertension Father   . Emphysema Father   . Heart attack Father   . Cirrhosis Father   . Coronary artery disease Mother   . Heart failure Mother   . COPD Mother   . Breast cancer Maternal Grandmother   . HIV Brother   . Diabetes Sister     Social History   Socioeconomic History  . Marital status: Single    Spouse name: Not on file  . Number of children: N  . Years of education: Not on file  . Highest education level: Not on file  Occupational History  . Occupation: parts specialist [employer]   Tobacco Use  . Smoking status: Former Smoker    Packs/day: 1.00    Years: 31.00    Pack years: 31.00    Types: Cigarettes    Quit date: 10/08/2000    Years since quitting: 19.1  . Smokeless tobacco: Never Used  Substance and Sexual Activity  . Alcohol use: Not Currently    Comment: rarely  . Drug use: No  . Sexual activity: Not on file  Other Topics Concern  . Not on file  Social History Narrative  . Not on file   Social Determinants of Health   Financial Resource Strain:   . Difficulty of Paying Living Expenses: Not on file  Food Insecurity:   . Worried About Charity fundraiser in the Last Year: Not on file  . Ran Out of Food in the Last Year: Not on file  Transportation Needs:   . Lack of Transportation (Medical): Not on file  . Lack of Transportation (Non-Medical): Not on file  Physical Activity:   . Days of Exercise per Week: Not on file  . Minutes of Exercise per Session: Not on file  Stress:   . Feeling of Stress : Not on file  Social Connections:   . Frequency of Communication with Friends and Family: Not on file  . Frequency of Social Gatherings with Friends and Family: Not on file  . Attends Religious Services: Not on file  . Active Member of Clubs or Organizations: Not on file  . Attends Archivist Meetings: Not on file  . Marital Status: Not on file     Review  of Systems: A 12 point ROS discussed and pertinent positives are indicated in the HPI above.  All other systems are negative.  Review of Systems  Constitutional: Positive for fatigue. Negative for fever.  Respiratory: Negative for cough and shortness of breath.   Cardiovascular: Negative for chest pain.  Gastrointestinal: Negative for abdominal pain, nausea and vomiting.  Genitourinary: Negative for dysuria.  Musculoskeletal: Negative for back pain.  Neurological: Positive for weakness.  Psychiatric/Behavioral: Negative for behavioral problems and confusion.    Vital Signs: BP 98/63 (BP Location: Right Arm)   Pulse 63   Temp 98.4 F (36.9 C) (Oral)   Resp 18  SpO2 100%   Physical Exam Vitals and nursing note reviewed.  Constitutional:      Appearance: Normal appearance.  HENT:     Mouth/Throat:     Mouth: Mucous membranes are moist.     Pharynx: Oropharynx is clear.  Cardiovascular:     Rate and Rhythm: Normal rate and regular rhythm.  Pulmonary:     Effort: Pulmonary effort is normal. No respiratory distress.     Breath sounds: Normal breath sounds.  Abdominal:     General: Abdomen is flat. There is no distension.     Palpations: Abdomen is soft.  Skin:    General: Skin is warm and dry.  Neurological:     General: No focal deficit present.     Mental Status: She is alert and oriented to person, place, and time. Mental status is at baseline.  Psychiatric:        Mood and Affect: Mood normal.        Behavior: Behavior normal.        Thought Content: Thought content normal.        Judgment: Judgment normal.      MD Evaluation Airway: WNL Heart: WNL Abdomen: WNL Chest/ Lungs: WNL ASA  Classification: 3 Mallampati/Airway Score: One   Imaging: CUP PACEART REMOTE DEVICE CHECK  Result Date: 11/19/2019 [facility] summary report received. Battery status OK. Normal device function. No new symptom episodes, tachy episodes, brady, or pause episodes. No new AF  episodes. Monthly summary reports and ROV/PRN Kathy Breach, RN, CCDS, [employer]   Labs:  CBC: Recent Labs    11/20/19 1010 11/30/19 0722  WBC 1.3* 1.8*  HGB 6.4* 9.7*  HCT 18.9* 29.0*  PLT 276 305    COAGS: No results for input(s): INR, APTT in the last 8760 hours.  BMP: Recent Labs    11/20/19 1010  NA 138  K 3.3*  CL 100  CO2 27  GLUCOSE 96  BUN 16  CALCIUM 9.2  CREATININE 1.67*  GFRNONAA 32*  GFRAA 38*    LIVER FUNCTION TESTS: No results for input(s): BILITOT, AST, ALT, ALKPHOS, PROT, ALBUMIN in the last 8760 hours.  TUMOR MARKERS: No results for input(s): AFPTM, CEA, CA199, CHROMGRNA in the last 8760 hours.  Assessment and Plan: Patient with past medical history of HTN, stroke presents with complaint of symptomatic anemia.  IR consulted for bone marrow biopsy at the request of Dr. Alen Blew. Case reviewed by Dr. Laurence Ferrari who approves patient for procedure.  Patient presents today in their usual state of health.  She has been NPO and is not currently on blood thinners.   Risks and benefits discussed with the patient and/or patient's family including, but not limited to bleeding, infection, damage to adjacent structures or low yield requiring additional tests.  All of the questions were answered and there is agreement to proceed.  Consent signed and in chart.  Thank you for this interesting consult.  I greatly enjoyed meeting Breanna Perry and look forward to participating in their care.  A copy of this report was sent to the requesting provider on this date.  Electronically Signed: Docia Barrier, PA 11/30/2019, 8:34 AM   I spent a total of  30 Minutes   in face to face in clinical consultation, greater than 50% of which was counseling/coordinating care for symptomatic anemia.

## 2019-12-02 ENCOUNTER — Other Ambulatory Visit: Payer: Self-pay | Admitting: Oncology

## 2019-12-02 ENCOUNTER — Telehealth: Payer: Self-pay | Admitting: Oncology

## 2019-12-02 DIAGNOSIS — F411 Generalized anxiety disorder: Secondary | ICD-10-CM | POA: Diagnosis not present

## 2019-12-02 DIAGNOSIS — D649 Anemia, unspecified: Secondary | ICD-10-CM

## 2019-12-02 LAB — SURGICAL PATHOLOGY

## 2019-12-02 NOTE — Telephone Encounter (Signed)
Scheduled appt per 2/24 sch message - pt is aware of appt date and time   

## 2019-12-02 NOTE — Progress Notes (Signed)
The results of the bone marrow biopsy were discussed with Breanna Perry over the phone today.  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  The ramification of this diagnosis were reviewed today and I recommended evaluation at a tertiary care center with specialty and bone marrow disorders and possibly bone marrow transplant.  I offered her a referral to [facility] which she will think about.  In the meantime I will repeat a CBC in 2 weeks and have MD follow-up at that time to follow her counts and transfuse as needed.  Her absolute neutrophil count is 600 which should be adequate at this time for preventing opportunistic infections.  Her hemoglobin was 9.7 on February 22 and does not require a repeat transfusion.

## 2019-12-04 ENCOUNTER — Telehealth: Payer: Self-pay

## 2019-12-04 NOTE — Telephone Encounter (Signed)
Received fax from [facility] requesting last office visit notes for patient's upcoming PCP appointment on 12/21/19. Records faxed to [phone]. Confirmation received.

## 2019-12-07 ENCOUNTER — Encounter (HOSPITAL_COMMUNITY): Payer: Self-pay | Admitting: Oncology

## 2019-12-07 ENCOUNTER — Other Ambulatory Visit: Payer: Self-pay

## 2019-12-07 ENCOUNTER — Encounter: Payer: BC Managed Care – PPO | Attending: Psychology | Admitting: Psychology

## 2019-12-07 ENCOUNTER — Telehealth: Payer: Self-pay

## 2019-12-07 DIAGNOSIS — I69311 Memory deficit following cerebral infarction: Secondary | ICD-10-CM

## 2019-12-07 DIAGNOSIS — I6990 Unspecified sequelae of unspecified cerebrovascular disease: Secondary | ICD-10-CM | POA: Diagnosis not present

## 2019-12-07 DIAGNOSIS — M3500 Sicca syndrome, unspecified: Secondary | ICD-10-CM

## 2019-12-07 NOTE — Telephone Encounter (Signed)
-----   Message from Wyatt Portela, MD sent at 12/07/2019 10:11 AM EST ----- Ok. No intervention needed now. Thanks ----- Message ----- From: Tami Lin, RN Sent: 12/07/2019  10:04 AM EST To: Wyatt Portela, MD  Patient called and wanted to make you aware that she has noticed red bumps on her face and body since she had her phone visit with you. I asked her if she had pin point red spots just beneath the skin and she said no, this is more like raised bumps on the skin similar to a rash. She has a follow up appointment with you on March 8th but said she wanted to make you aware.  [name]

## 2019-12-08 ENCOUNTER — Encounter: Payer: Self-pay | Admitting: Psychology

## 2019-12-08 NOTE — Progress Notes (Signed)
Today I provided feedback regarding the results of the recent neuropsychological evaluation.  The summary and impressions/diagnoses from her formal evaluation can be found below for convenience and the complete evaluation can be found in her EMR dated 11/16/2019.  We reviewed the results of this evaluation and also reviewed new information and discomfort of light since her last visit.  The patient has been diagnosed with anemia that has been causing a great deal of fatigue and has persistent over the past several months.  Bone biopsy and other studies have identified MDS with excess blasts although no evidence of acute leukemia with plans for referral to a tertiary care center with specialty and bone marrow disorders and for possible bone marrow transplant.  These issues with fatigue and anemia may have played a role in some of the waxing and waning of symptoms identified in my evaluation.  The patient is also not been fitted for her mask for CPAP device.  It will be imperative that she follow-up with her CPAP care as well as follow-up with her MDS care and treatment.    Summary of Results:                        Overall, the results of the current neuropsychological evaluation are consistent with some residual effects of her stroke primarily affecting visual scanning and visual searching abilities, a global reduction in overall cognitive performances relative to predicted levels and changes in visual analysis and organization but visual reasoning and problem solving been quite well maintained.  There were no indications of significant expressive language deficits and no indications of significant attention and concentration deficits.  Overall information processing speed was quite good with the exception of visual scanning and visual searching measures.  The patient's memory functions did not show any consistent pattern of significant deficits and there was some waxing and waning of encoding abilities between 2  separate testing days but overall her attention and concentration appeared to be well within normal limits.  The patient had more difficulties with multi processing types of attentional measures then she did primary encoding functions.  Impression/Diagnosis:                     Overall, the results of the current neuropsychological evaluation are primarily consistent with residual/late effects of her MCA territory infarct from December 2019 primarily affecting the right M2 branch (sylvian segment).  There are no indications of left hemisphere involvement in her weaknesses or deficits.  This region of the brain can affect visual scanning and visual searching abilities and some visual spatial aspects that are noted.  There did not appear to be any left-sided visual neglect noted.  There were clearly no impairments of alertness identified.  However, the patient does have some significant sensorimotor changes in the patient's identifications of leg movement and sensation when sleeping are likely directly related to her cerebrovascular event.  While the patient has some reduction overall cognitive functioning it is primarily related to visual scanning and searching weaknesses and difficulties with retrieval of learned information but the patient's overall memory and learning capacities appear to be within normal limits.  There does not appear to be significant right temporal lobe involvement in her impairments.  There does not appear to be any indications consistent with various degenerative cortically mediated dementia although we will need to do repeat testing in approximately 9 months to fully rule that out.  Again, I think that the biggest  aspect as far as objective cognitive deficits are directly related to her M2 branch right infarction and most of her symptoms can be explained by that.  However, the waxing and waning of some of the attentional issues and some of her other difficulties with changes in mood  and other cognitive difficulties that have developed since her stroke may be directly related to sustained sleep disturbance due to obstructive sleep apnea.  I hope by now but the patient has begun regularly using her CPAP device as she may still experience some improvement in some of the changes she is noted since her stroke.  I will provide feedback to the patient regarding the results of the current neuropsychological evaluation and will set up repeat testing in about 9 months if the patient continues to describe subjective symptoms related to further deterioration in functioning.  Diagnosis:                               Late effects of CVA (cerebrovascular accident)  Memory deficit after cerebral infarction  Sjogren's syndrome, with unspecified organ involvement ([place])  Cognitive and neurobehavioral dysfunction   Ilean Skill, Psy.D. Neuropsychologist

## 2019-12-14 ENCOUNTER — Inpatient Hospital Stay: Payer: No Typology Code available for payment source

## 2019-12-14 ENCOUNTER — Other Ambulatory Visit: Payer: Self-pay

## 2019-12-14 ENCOUNTER — Telehealth: Payer: Self-pay

## 2019-12-14 ENCOUNTER — Inpatient Hospital Stay: Payer: No Typology Code available for payment source | Attending: Oncology | Admitting: Oncology

## 2019-12-14 ENCOUNTER — Telehealth: Payer: Self-pay | Admitting: Oncology

## 2019-12-14 VITALS — BP 148/74 | HR 60 | Temp 98.2°F | Resp 18 | Wt 165.6 lb

## 2019-12-14 DIAGNOSIS — R238 Other skin changes: Secondary | ICD-10-CM | POA: Diagnosis not present

## 2019-12-14 DIAGNOSIS — R5383 Other fatigue: Secondary | ICD-10-CM | POA: Diagnosis not present

## 2019-12-14 DIAGNOSIS — D649 Anemia, unspecified: Secondary | ICD-10-CM | POA: Diagnosis not present

## 2019-12-14 DIAGNOSIS — Z7982 Long term (current) use of aspirin: Secondary | ICD-10-CM | POA: Diagnosis not present

## 2019-12-14 DIAGNOSIS — Z79899 Other long term (current) drug therapy: Secondary | ICD-10-CM | POA: Insufficient documentation

## 2019-12-14 DIAGNOSIS — D469 Myelodysplastic syndrome, unspecified: Secondary | ICD-10-CM | POA: Insufficient documentation

## 2019-12-14 LAB — CBC WITH DIFFERENTIAL (CANCER CENTER ONLY)
Abs Immature Granulocytes: 0.06 10*3/uL (ref 0.00–0.07)
Basophils Absolute: 0 10*3/uL (ref 0.0–0.1)
Basophils Relative: 1 %
Eosinophils Absolute: 0 10*3/uL (ref 0.0–0.5)
Eosinophils Relative: 1 %
HCT: 22.9 % — ABNORMAL LOW (ref 36.0–46.0)
Hemoglobin: 7.6 g/dL — ABNORMAL LOW (ref 12.0–15.0)
Immature Granulocytes: 4 %
Lymphocytes Relative: 53 %
Lymphs Abs: 0.7 10*3/uL (ref 0.7–4.0)
MCH: 32.2 pg (ref 26.0–34.0)
MCHC: 33.2 g/dL (ref 30.0–36.0)
MCV: 97 fL (ref 80.0–100.0)
Monocytes Absolute: 0.1 10*3/uL (ref 0.1–1.0)
Monocytes Relative: 9 %
Neutro Abs: 0.5 10*3/uL — ABNORMAL LOW (ref 1.7–7.7)
Neutrophils Relative %: 32 %
Platelet Count: 224 10*3/uL (ref 150–400)
RBC: 2.36 MIL/uL — ABNORMAL LOW (ref 3.87–5.11)
RDW: 17.4 % — ABNORMAL HIGH (ref 11.5–15.5)
WBC Count: 1.4 10*3/uL — ABNORMAL LOW (ref 4.0–10.5)
nRBC: 0 % (ref 0.0–0.2)

## 2019-12-14 NOTE — Progress Notes (Signed)
Hematology and Oncology Follow Up Visit  Breanna Perry [id number] Sep 19, 1956 64 y.o. 12/14/2019 9:20 AM Breanna Perry, MDSmith, [name], MD   Principle Diagnosis: 64 year old woman with MDS diagnosed and February 2021.  She was found to have high risk disease with IPSS-R score of 6 and 12 to 14% blasts in the bone marrow.  Cytogenetics showed a 5 q deletion.   Current therapy: Supportive packed red cell transfusion under consideration for definitive therapy.  Last blood transfusion around November 20, 2019.  Interim History: Breanna Perry presents today for a follow-up visit with a friend.  Since the last visit, she continues to report fatigue and tiredness but no fevers, chills or sweats.  She denies any recurrent bleeding or bruising.  She does report skin blisters on her face and arms.  She continues to work although she is having difficult time maintaining work-related duties because of fatigue and tiredness.     Medications: I have reviewed the patient's current medications.  Current Outpatient Medications  Medication Sig Dispense Refill  . ARIPiprazole (ABILIFY) 2 MG tablet Take 2 mg by mouth daily.    Marland Kitchen aspirin EC 81 MG EC tablet Take 1 tablet (81 mg total) by mouth daily.    Marland Kitchen atorvastatin (LIPITOR) 40 MG tablet Take 1 tablet (40 mg total) by mouth daily at 6 PM. (Patient taking differently: Take 40 mg by mouth daily. ) 30 tablet 2  . citalopram (CELEXA) 40 MG tablet Take 40 mg by mouth daily.   1  . colesevelam (WELCHOL) 625 MG tablet Take 1,875 mg by mouth 2 (two) times daily.    . Eszopiclone 3 MG TABS Take 3 mg by mouth at bedtime as needed (sleep).     . metoprolol (TOPROL-XL) 50 MG 24 hr tablet Take 50 mg by mouth daily.     . Olmesartan-Amlodipine-HCTZ (TRIBENZOR) 40-10-25 MG TABS Take 1 tablet by mouth daily.     . pantoprazole (PROTONIX) 40 MG tablet Take 1 tablet (40 mg total) by mouth daily. 60 tablet 1   No current facility-administered medications for this visit.      Allergies: No Known Allergies    Physical Exam: Blood pressure (!) 148/74, pulse 60, temperature 98.2 F (36.8 C), temperature source Temporal, resp. rate 18, weight 165 lb 9.6 oz (75.1 kg), SpO2 100 %. ECOG: 1    General appearance: Comfortable appearing without any discomfort Head: Normocephalic without any trauma Oropharynx: Mucous membranes are moist and pink without any thrush or ulcers. Eyes: Pupils are equal and round reactive to light. Lymph nodes: No cervical, supraclavicular, inguinal or axillary lymphadenopathy.   Heart:regular rate and rhythm.  S1 and S2 without leg edema. Lung: Clear without any rhonchi or wheezes.  No dullness to percussion. Abdomin: Soft, nontender, nondistended with good bowel sounds.  No hepatosplenomegaly. Musculoskeletal: No joint deformity or effusion.  Full range of motion noted. Neurological: No deficits noted on motor, sensory and deep tendon reflex exam. Skin: Eruption noted on her forehead and cheeks with mild erythema and scarring.     Lab Results: Lab Results  Component Value Date   WBC 1.4 (L) 12/14/2019   HGB 7.6 (L) 12/14/2019   HCT 22.9 (L) 12/14/2019   MCV 97.0 12/14/2019   PLT 224 12/14/2019     Chemistry      Component Value Date/Time   NA 138 11/20/2019 1010   NA 141 10/10/2018 1333   K 3.3 (L) 11/20/2019 1010   CL 100 11/20/2019 1010   CO2 27  11/20/2019 1010   BUN 16 11/20/2019 1010   BUN 12 10/10/2018 1333   CREATININE 1.67 (H) 11/20/2019 1010   CREATININE 1.01 03/29/2014 1428      Component Value Date/Time   CALCIUM 9.2 11/20/2019 1010   ALKPHOS 49 09/29/2018 1140   AST 19 09/29/2018 1140   ALT 23 09/29/2018 1140   BILITOT 1.0 09/29/2018 1140       Impression and Plan:   64 year old woman with:  1.  Myelodysplastic syndrome diagnosed in February 2021.  She presented with neutropenia and anemia and found to have 12 to 14% blasts in the bone marrow.  Cytogenetics showed 5 q. deletion.  Her  IPSS-R score is 6 which indicates high risk disease.  The natural course of this disease and treatment options were reviewed today with the patient in detail.  Is clearly a high risk of progressing into acute leukemia as well as risk of complications from this condition.  These would include recurrent infections, bleeding, sepsis and excessive fatigue and tiredness.  Treatment options at this time were reviewed which include oral agents such as Revlimid, 5-azacytidine as well as induction chemotherapy as well as stem cell transplant.  He is overall a reasonable candidate for most treatment at this time.  Not quite sure if she is eligible for allogeneic stem cell transplant however.  I have recommended referral to [facility] for evaluation prior to proceeding with any definitive treatment at this time.  He is agreeable and will make the appropriate referrals.  2.  Anemia: We will arrange for transfusion in the near future given her symptomatology.  3.  Prognosis: She has aggressive disease but carries high risk of transformation into acute leukemia and risk of death.  I do not believe her condition is curable without aggressive treatments and induction chemotherapy potential list stem cell transplant.  3.  Follow-up: We will be in the next few weeks to follow her progress.  30  minutes were dedicated to this visit. The time was spent on reviewing laboratory data, discussing treatment options, discussing differential diagnosis and answering questions regarding future plan.    Zola Button, MD 3/8/20219:20 AM

## 2019-12-14 NOTE — Telephone Encounter (Signed)
-----   Message from Wyatt Portela, MD sent at 12/14/2019  9:30 AM EST ----- Regarding: [facility] referral We need to refer her to Dr. Jerrye Noble at [place].  Phone number is [phone].  Diagnosis is high risk MDS.  She has a bone marrow pathology that need to be sent there as well.  Thanks

## 2019-12-14 NOTE — Telephone Encounter (Signed)
Scheduled appt per 3/8 los.  Spoke with pt and she is aware of the appt dates and time,

## 2019-12-14 NOTE — Telephone Encounter (Signed)
Called and spoke to Amy at Dr. Clovis Fredrickson Powell's office. Patient scheduled to see Dr. Florene Glen on March 26 th at 12:15 pm. Called and spoke to patient and she is aware of appointment day and time and verbalized understanding. Patient also given the phone number [phone] in case she has questions or needs to reschedule. Patient records faxed to [phone] with confirmation fax was received.

## 2019-12-21 ENCOUNTER — Ambulatory Visit (INDEPENDENT_AMBULATORY_CARE_PROVIDER_SITE_OTHER): Payer: No Typology Code available for payment source | Admitting: *Deleted

## 2019-12-21 ENCOUNTER — Other Ambulatory Visit: Payer: Self-pay

## 2019-12-21 ENCOUNTER — Telehealth: Payer: Self-pay

## 2019-12-21 ENCOUNTER — Emergency Department (HOSPITAL_COMMUNITY)
Admission: EM | Admit: 2019-12-21 | Discharge: 2019-12-21 | Disposition: A | Discharge status: Home / Self Care | Payer: No Typology Code available for payment source | Attending: Emergency Medicine | Admitting: Emergency Medicine

## 2019-12-21 ENCOUNTER — Encounter (HOSPITAL_COMMUNITY): Payer: Self-pay

## 2019-12-21 DIAGNOSIS — D649 Anemia, unspecified: Secondary | ICD-10-CM | POA: Diagnosis not present

## 2019-12-21 DIAGNOSIS — Z87891 Personal history of nicotine dependence: Secondary | ICD-10-CM | POA: Insufficient documentation

## 2019-12-21 DIAGNOSIS — I63511 Cerebral infarction due to unspecified occlusion or stenosis of right middle cerebral artery: Secondary | ICD-10-CM | POA: Diagnosis not present

## 2019-12-21 DIAGNOSIS — Z79899 Other long term (current) drug therapy: Secondary | ICD-10-CM | POA: Diagnosis not present

## 2019-12-21 DIAGNOSIS — M3500 Sicca syndrome, unspecified: Secondary | ICD-10-CM | POA: Insufficient documentation

## 2019-12-21 DIAGNOSIS — Z7982 Long term (current) use of aspirin: Secondary | ICD-10-CM | POA: Insufficient documentation

## 2019-12-21 DIAGNOSIS — D469 Myelodysplastic syndrome, unspecified: Secondary | ICD-10-CM | POA: Diagnosis not present

## 2019-12-21 DIAGNOSIS — E876 Hypokalemia: Secondary | ICD-10-CM | POA: Diagnosis not present

## 2019-12-21 DIAGNOSIS — R790 Abnormal level of blood mineral: Secondary | ICD-10-CM | POA: Diagnosis not present

## 2019-12-21 DIAGNOSIS — I10 Essential (primary) hypertension: Secondary | ICD-10-CM | POA: Diagnosis not present

## 2019-12-21 DIAGNOSIS — D729 Disorder of white blood cells, unspecified: Secondary | ICD-10-CM | POA: Diagnosis not present

## 2019-12-21 DIAGNOSIS — R799 Abnormal finding of blood chemistry, unspecified: Secondary | ICD-10-CM | POA: Diagnosis present

## 2019-12-21 LAB — CBC WITH DIFFERENTIAL/PLATELET
Abs Immature Granulocytes: 0.01 10*3/uL (ref 0.00–0.07)
Basophils Absolute: 0 10*3/uL (ref 0.0–0.1)
Basophils Relative: 1 %
Eosinophils Absolute: 0 10*3/uL (ref 0.0–0.5)
Eosinophils Relative: 1 %
HCT: 24 % — ABNORMAL LOW (ref 36.0–46.0)
Hemoglobin: 7.7 g/dL — ABNORMAL LOW (ref 12.0–15.0)
Immature Granulocytes: 1 %
Lymphocytes Relative: 52 %
Lymphs Abs: 0.8 10*3/uL (ref 0.7–4.0)
MCH: 32.2 pg (ref 26.0–34.0)
MCHC: 32.1 g/dL (ref 30.0–36.0)
MCV: 100.4 fL — ABNORMAL HIGH (ref 80.0–100.0)
Monocytes Absolute: 0.2 10*3/uL (ref 0.1–1.0)
Monocytes Relative: 11 %
Neutro Abs: 0.5 10*3/uL — ABNORMAL LOW (ref 1.7–7.7)
Neutrophils Relative %: 34 %
Platelets: 266 10*3/uL (ref 150–400)
RBC: 2.39 MIL/uL — ABNORMAL LOW (ref 3.87–5.11)
RDW: 17.7 % — ABNORMAL HIGH (ref 11.5–15.5)
WBC: 1.5 10*3/uL — ABNORMAL LOW (ref 4.0–10.5)
nRBC: 0 % (ref 0.0–0.2)

## 2019-12-21 LAB — CUP PACEART REMOTE DEVICE CHECK
Date Time Interrogation Session: 20210314001046
Implantable Pulse Generator Implant Date: 20200114

## 2019-12-21 LAB — BASIC METABOLIC PANEL
Anion gap: 11 (ref 5–15)
BUN: 9 mg/dL (ref 8–23)
CO2: 29 mmol/L (ref 22–32)
Calcium: 9.7 mg/dL (ref 8.9–10.3)
Chloride: 99 mmol/L (ref 98–111)
Creatinine, Ser: 1.02 mg/dL — ABNORMAL HIGH (ref 0.44–1.00)
GFR calc Af Amer: >60 mL/min (ref >60)
GFR calc non Af Amer: 59 mL/min — ABNORMAL LOW (ref >60)
Glucose, Bld: 114 mg/dL — ABNORMAL HIGH (ref 70–99)
Potassium: 2.7 mmol/L — CL (ref 3.5–5.1)
Sodium: 139 mmol/L (ref 135–145)

## 2019-12-21 MED ORDER — POTASSIUM CHLORIDE CRYS ER 20 MEQ PO TBCR
40.0000 meq | EXTENDED_RELEASE_TABLET | Freq: Once | ORAL | Status: AC
  Administered 2019-12-21: 40 meq via ORAL
  Filled 2019-12-21: qty 2

## 2019-12-21 MED ORDER — POTASSIUM CHLORIDE CRYS ER 20 MEQ PO TBCR: 40.0000 meq | EXTENDED_RELEASE_TABLET | Freq: Two times a day (BID) | ORAL | 0 refills | Status: DC

## 2019-12-21 MED ORDER — POTASSIUM CHLORIDE 10 MEQ/100ML IV SOLN
10.0000 meq | INTRAVENOUS | Status: AC
  Administered 2019-12-21 (×2): 10 meq via INTRAVENOUS
  Filled 2019-12-21 (×2): qty 100

## 2019-12-21 NOTE — ED Notes (Signed)
An After Visit Summary was printed and given to the patient. Discharge instructions given and no further questions at this time.  

## 2019-12-21 NOTE — Telephone Encounter (Signed)
Received call from patient stating she had labs drawn at the [place] today.  Patient states her physician's office at the [place] called and told her that her potassium levels were "critically low" and they advised her to go to the ED immediately to be evaluated. Patient called office asking if [facility] needed [facility] or if she could have this checked during her visit on Wednesday. Patient informed she needs to follow the [facility] instructions. Patient verbalized understanding and stated she will go to the ED later today.

## 2019-12-21 NOTE — Progress Notes (Signed)
ILR Remote 

## 2019-12-21 NOTE — ED Triage Notes (Signed)
Patient states she was called by the [facility] today and was told her K+ level was 2.0.  Patient states she is scheduled for a blood transfusion On Wednesday at the [facility].

## 2019-12-21 NOTE — ED Notes (Signed)
Pt called out for N/V.  Ray, MD made aware.

## 2019-12-21 NOTE — ED Provider Notes (Signed)
[facility] DEPT Provider Note   CSN: [id number] Arrival date & time: 12/21/19  1114     History Chief Complaint  Patient presents with  . Abnormal Lab    Breanna Perry is a 64 y.o. female.  HPI 64 yo female with recent diagnosis of MDS presents today with anemia and hypokalemia.  Patient had labs drawn at [facility] today.  She was called and told to come here for very low potassium.  She reports that she was scheduled for transfusion later this week.  She is generally weak but has not noted any significantly progressive weakness, numbness, or tingling. Review of records reveals she has MDS with high risk, ho stroke, with last hgb 7.6 on 12/14/19.     Past Medical History:  Diagnosis Date  . Anemia   . Anxiety   . Dental bridge present    upper and lower  . Depression   . Hypertension    under control with med., has been on med. x 8 yr.  . Leukocytopenia   . Medial meniscus tear 12/2012   left  . Sjogren's syndrome ([place])   . Stroke ([place])   . Wears glasses    reading    Patient Active Problem List   Diagnosis Date Noted  . HTN (hypertension) 09/30/2018  . Hyperlipidemia LDL goal <70 09/30/2018  . Sjogren's syndrome ([place]) 09/30/2018  . Obesity 09/30/2018  . Acute right arterial ischemic stroke, middle cerebral artery (MCA) ([place]) s/p tPA 09/29/2018  . Tinnitus, bilateral 06/02/2018  . Sensorineural hearing loss (SNHL), bilateral 06/02/2018  . Impacted cerumen of both ears 06/02/2018  . Alveolar pneumopathy ([place]) 04/12/2011    Past Surgical History:  Procedure Laterality Date  . APPENDECTOMY  04/1991  . BIOPSY  11/20/2019   Procedure: BIOPSY;  Surgeon: Ronnette Juniper, MD;  Location: [facility];  Service: Gastroenterology;;  . COLONOSCOPY    . COLONOSCOPY WITH PROPOFOL N/A 11/20/2019   Procedure: COLONOSCOPY WITH PROPOFOL;  Surgeon: Ronnette Juniper, MD;  Location: [facility];  Service: Gastroenterology;  Laterality: N/A;  .  ESOPHAGOGASTRODUODENOSCOPY (EGD) WITH PROPOFOL N/A 11/20/2019   Procedure: ESOPHAGOGASTRODUODENOSCOPY (EGD) WITH PROPOFOL;  Surgeon: Ronnette Juniper, MD;  Location: [facility];  Service: Gastroenterology;  Laterality: N/A;  . KNEE ARTHROSCOPY Left 01/07/2013   Procedure: LEFT KNEE ARTHROSCOPY PARTIAL MEDIAL MENISECTOMY CHRONDOPLASTY MEDIAL PLICA RESECTION;  Surgeon: Alta Corning, MD;  Location: [facility];  Service: Orthopedics;  Laterality: Left;  . KNEE SURGERY Left 06/22/2015  . LIVER BIOPSY  summer 2011  . LOOP RECORDER INSERTION N/A 10/21/2018   Procedure: LOOP RECORDER INSERTION;  Surgeon: Thompson Grayer, MD;  Location: [facility];  Service: Cardiovascular;  Laterality: N/A;  . LUNG BIOPSY  summer 2011  . PARTIAL HYSTERECTOMY  04/1991   partial  . TEE WITHOUT CARDIOVERSION N/A 10/21/2018   Procedure: TRANSESOPHAGEAL ECHOCARDIOGRAM (TEE);  Surgeon: Elouise Munroe, MD;  Location: [facility];  Service: Cardiology;  Laterality: N/A;  possible loop recorder     OB History   No obstetric history on file.     Family History  Problem Relation Age of Onset  . Hypertension Father   . Emphysema Father   . Heart attack Father   . Cirrhosis Father   . Coronary artery disease Mother   . Heart failure Mother   . COPD Mother   . Breast cancer Maternal Grandmother   . HIV Brother   . Diabetes Sister     Social History  Tobacco Use  . Smoking status: Former Smoker    Packs/day: 1.00    Years: 31.00    Pack years: 31.00    Types: Cigarettes    Quit date: 10/08/2000    Years since quitting: 19.2  . Smokeless tobacco: Never Used  Substance Use Topics  . Alcohol use: Not Currently    Comment: rarely  . Drug use: No    Home Medications Prior to Admission medications   Medication Sig Start Date End Date Taking? Authorizing Provider  ARIPiprazole (ABILIFY) 2 MG tablet Take 2 mg by mouth daily. 04/02/18   [provider]  aspirin EC 81 MG EC tablet Take  1 tablet (81 mg total) by mouth daily. 10/01/18   Donzetta Starch, NP  atorvastatin (LIPITOR) 40 MG tablet Take 1 tablet (40 mg total) by mouth daily at 6 PM. Patient taking differently: Take 40 mg by mouth daily.  09/30/18   Donzetta Starch, NP  citalopram (CELEXA) 40 MG tablet Take 40 mg by mouth daily.  04/09/17   [provider]  colesevelam (WELCHOL) 625 MG tablet Take 1,875 mg by mouth 2 (two) times daily.    [provider]  Eszopiclone 3 MG TABS Take 3 mg by mouth at bedtime as needed (sleep).  10/02/19   [provider]  metoprolol (TOPROL-XL) 50 MG 24 hr tablet Take 50 mg by mouth daily.     [provider]  Olmesartan-Amlodipine-HCTZ (TRIBENZOR) 40-10-25 MG TABS Take 1 tablet by mouth daily.     [provider]  pantoprazole (PROTONIX) 40 MG tablet Take 1 tablet (40 mg total) by mouth daily. 11/20/19 11/19/20  Ronnette Juniper, MD    Allergies    Patient has no known allergies.  Review of Systems   Review of Systems  Gastrointestinal:       Poor appetite, taking fluids ok and voiding regularly  All other systems reviewed and are negative.   Physical Exam Updated Vital Signs BP 115/67 (BP Location: Left Arm)   Pulse 78   Temp 98.3 F (36.8 C) (Oral)   Resp 16   Ht 1.613 m (5' 3.5")   Wt 73.5 kg   SpO2 100%   BMI 28.25 kg/m   Physical Exam Vitals and nursing note reviewed.  Constitutional:      Appearance: Normal appearance.  HENT:     Head: Normocephalic.     Right Ear: External ear normal.     Left Ear: External ear normal.     Nose: Nose normal.     Mouth/Throat:     Mouth: Mucous membranes are moist.  Eyes:     Extraocular Movements: Extraocular movements intact.     Pupils: Pupils are equal, round, and reactive to light.  Cardiovascular:     Rate and Rhythm: Normal rate and regular rhythm.     Pulses: Normal pulses.  Pulmonary:     Effort: Pulmonary effort is normal.  Abdominal:     Palpations: Abdomen is soft.   Musculoskeletal:        General: Normal range of motion.     Cervical back: Normal range of motion.  Skin:    General: Skin is warm.     Capillary Refill: Capillary refill takes less than 2 seconds.  Neurological:     General: No focal deficit present.     Mental Status: She is alert.  Psychiatric:        Mood and Affect: Mood normal.  ED Results / Procedures / Treatments   Labs (all labs ordered are listed, but only abnormal results are displayed) Labs Reviewed - No data to display  EKG None  Radiology No results found.  Procedures Procedures (including critical care time)  Medications Ordered in ED Medications - No data to display  ED Course  I have reviewed the triage vital signs and the nursing notes.  Pertinent labs & imaging results that were available during my care of the patient were reviewed by me and considered in my medical decision making (see chart for details).    MDM Rules/Calculators/A&P                      64 year old female with myelodysplastic syndrome presents today with hypokalemia.  Here potassium is 2.7.  She has had oral and IV repletion.  She will be given a prescription for home use.  Plan recheck of potassium level later this week. Additionally patient has been anemic.  Her last hemoglobin was 7.6.  Today it is 7.7.  She is scheduled for transfusion on Wednesday.  She is not tachycardic, and blood pressures have ranged from 123456 15 systolically.  She feels somewhat weak but has not had any near syncope.  She appears stable to have her transfusion is scheduled as an outpatient. Final Clinical Impression(s) / ED Diagnoses Final diagnoses:  Hypokalemia  Anemia, unspecified type  Myelodysplastic syndrome ([place])    Rx / DC Orders ED Discharge Orders         Ordered    potassium chloride SA (KLOR-CON) 20 MEQ tablet  2 times daily     12/21/19 1306           Pattricia Boss, MD 12/21/19 1411

## 2019-12-21 NOTE — Discharge Instructions (Signed)
Your potassium is low.  You have been given IV and oral supplements here in the emergency department.  A prescription has been written and you will need to have it rechecked in the next 1 to 3 days. Additionally, you continue to be anemic but it appears stable from prior.  Keep your appointment for transfusion on Wednesday as scheduled

## 2019-12-22 DIAGNOSIS — Z7982 Long term (current) use of aspirin: Secondary | ICD-10-CM | POA: Diagnosis not present

## 2019-12-22 DIAGNOSIS — D469 Myelodysplastic syndrome, unspecified: Secondary | ICD-10-CM | POA: Diagnosis present

## 2019-12-22 DIAGNOSIS — R5383 Other fatigue: Secondary | ICD-10-CM | POA: Diagnosis not present

## 2019-12-22 DIAGNOSIS — R238 Other skin changes: Secondary | ICD-10-CM | POA: Diagnosis not present

## 2019-12-22 DIAGNOSIS — Z79899 Other long term (current) drug therapy: Secondary | ICD-10-CM | POA: Diagnosis not present

## 2019-12-23 ENCOUNTER — Inpatient Hospital Stay: Payer: No Typology Code available for payment source

## 2019-12-23 ENCOUNTER — Other Ambulatory Visit: Payer: Self-pay

## 2019-12-23 DIAGNOSIS — D469 Myelodysplastic syndrome, unspecified: Secondary | ICD-10-CM | POA: Diagnosis not present

## 2019-12-23 DIAGNOSIS — D649 Anemia, unspecified: Secondary | ICD-10-CM

## 2019-12-23 DIAGNOSIS — Z1231 Encounter for screening mammogram for malignant neoplasm of breast: Secondary | ICD-10-CM | POA: Diagnosis not present

## 2019-12-23 LAB — CBC WITH DIFFERENTIAL (CANCER CENTER ONLY)
Abs Immature Granulocytes: 0.02 10*3/uL (ref 0.00–0.07)
Basophils Absolute: 0 10*3/uL (ref 0.0–0.1)
Basophils Relative: 1 %
Eosinophils Absolute: 0 10*3/uL (ref 0.0–0.5)
Eosinophils Relative: 2 %
HCT: 22.4 % — ABNORMAL LOW (ref 36.0–46.0)
Hemoglobin: 7.3 g/dL — ABNORMAL LOW (ref 12.0–15.0)
Immature Granulocytes: 1 %
Lymphocytes Relative: 49 %
Lymphs Abs: 0.8 10*3/uL (ref 0.7–4.0)
MCH: 32.4 pg (ref 26.0–34.0)
MCHC: 32.6 g/dL (ref 30.0–36.0)
MCV: 99.6 fL (ref 80.0–100.0)
Monocytes Absolute: 0.2 10*3/uL (ref 0.1–1.0)
Monocytes Relative: 11 %
Neutro Abs: 0.6 10*3/uL — ABNORMAL LOW (ref 1.7–7.7)
Neutrophils Relative %: 36 %
Platelet Count: 268 10*3/uL (ref 150–400)
RBC: 2.25 MIL/uL — ABNORMAL LOW (ref 3.87–5.11)
RDW: 18.1 % — ABNORMAL HIGH (ref 11.5–15.5)
WBC Count: 1.6 10*3/uL — ABNORMAL LOW (ref 4.0–10.5)
nRBC: 0 % (ref 0.0–0.2)

## 2019-12-23 LAB — SAMPLE TO BLOOD BANK

## 2019-12-23 LAB — PREPARE RBC (CROSSMATCH)

## 2019-12-23 MED ORDER — ACETAMINOPHEN 325 MG PO TABS
ORAL_TABLET | ORAL | Status: AC
  Filled 2019-12-23: qty 2

## 2019-12-23 MED ORDER — ACETAMINOPHEN 325 MG PO TABS
650.0000 mg | ORAL_TABLET | Freq: Once | ORAL | Status: AC
  Administered 2019-12-23: 12:00:00 650 mg via ORAL

## 2019-12-23 MED ORDER — SODIUM CHLORIDE 0.9% IV SOLUTION
250.0000 mL | Freq: Once | INTRAVENOUS | Status: AC
  Administered 2019-12-23: 250 mL via INTRAVENOUS
  Filled 2019-12-23: qty 250

## 2019-12-23 MED ORDER — DIPHENHYDRAMINE HCL 25 MG PO CAPS
ORAL_CAPSULE | ORAL | Status: AC
  Filled 2019-12-23: qty 1

## 2019-12-23 MED ORDER — DIPHENHYDRAMINE HCL 25 MG PO CAPS
25.0000 mg | ORAL_CAPSULE | Freq: Once | ORAL | Status: AC
  Administered 2019-12-23: 25 mg via ORAL

## 2019-12-23 NOTE — Progress Notes (Signed)
Lab called with patient hgb 7.3. Dr. Alen Blew made aware. Verbal order received for 2 units PRBCs to be transfused today. Orders entered.

## 2019-12-23 NOTE — Patient Instructions (Signed)

## 2019-12-24 ENCOUNTER — Encounter: Payer: Self-pay | Admitting: Oncology

## 2019-12-24 LAB — TYPE AND SCREEN
ABO/RH(D): O POS
Antibody Screen: NEGATIVE
Unit division: 0
Unit division: 0

## 2019-12-24 LAB — BPAM RBC
Blood Product Expiration Date: 202104162359
Blood Product Expiration Date: 202104172359
ISSUE DATE / TIME: 202103171127
ISSUE DATE / TIME: 202103171127
Unit Type and Rh: 5100
Unit Type and Rh: 5100

## 2019-12-24 LAB — ABO/RH: ABO/RH(D): O POS

## 2019-12-31 DIAGNOSIS — G4733 Obstructive sleep apnea (adult) (pediatric): Secondary | ICD-10-CM | POA: Diagnosis not present

## 2019-12-31 DIAGNOSIS — D469 Myelodysplastic syndrome, unspecified: Secondary | ICD-10-CM | POA: Diagnosis not present

## 2019-12-31 DIAGNOSIS — R21 Rash and other nonspecific skin eruption: Secondary | ICD-10-CM | POA: Diagnosis not present

## 2019-12-31 DIAGNOSIS — L280 Lichen simplex chronicus: Secondary | ICD-10-CM | POA: Diagnosis not present

## 2020-01-01 DIAGNOSIS — D46Z Other myelodysplastic syndromes: Secondary | ICD-10-CM | POA: Diagnosis not present

## 2020-01-04 ENCOUNTER — Other Ambulatory Visit: Payer: Self-pay | Admitting: Oncology

## 2020-01-04 ENCOUNTER — Encounter: Payer: Self-pay | Admitting: Oncology

## 2020-01-04 DIAGNOSIS — D469 Myelodysplastic syndrome, unspecified: Secondary | ICD-10-CM | POA: Insufficient documentation

## 2020-01-04 MED ORDER — ALLOPURINOL 300 MG PO TABS: 300.0000 mg | ORAL_TABLET | Freq: Two times a day (BID) | ORAL | 0 refills | Status: DC

## 2020-01-04 MED ORDER — LIDOCAINE-PRILOCAINE 2.5-2.5 % EX CREA: 1.0000 "application " | TOPICAL_CREAM | CUTANEOUS | 0 refills | Status: AC | PRN

## 2020-01-04 MED ORDER — PROCHLORPERAZINE MALEATE 10 MG PO TABS: 10.0000 mg | ORAL_TABLET | Freq: Four times a day (QID) | ORAL | 0 refills | Status: AC | PRN

## 2020-01-04 NOTE — Progress Notes (Signed)
START ON PATHWAY REGIMEN - MDS     A cycle is every 28 days:     Azacitidine   **Always confirm dose/schedule in your pharmacy ordering system**  Patient Characteristics: Higher-Risk (IPSS-R Score > 3.5), First Line, Transplant Candidate WHO Disease Classification: MDS-EB2 Bone Marrow Blasts (percent): Unknown Cytogenetic Category: Unknown Platelets (x 10^9/L): Unknown Absolute Neutrophil Count (x 10^9/L): Unknown Line of Therapy: First Line IPSS-R Risk Category: Very High IPSS-R Risk Score: Unknown Check here if patient's risk score was calculated prior to the International Prognostic Scoring System-Revised (IPSS-R): false Hemoglobin (g/dl): Unknown Patient Characteristics: Transplant Candidate Intent of Therapy: Curative Intent, Discussed with Patient

## 2020-01-04 NOTE — Progress Notes (Signed)
Patient was evaluated by Dr. Florene Glen at [facility]  Her case was discussed with him via phone today.  He recommended proceeding with 5-azacytidine given her high risk of transformation into AML.  I discussed these recommendations with the patient today.  She is agreeable to proceed with this treatment.  Complication associated with this treatment include nausea, vomiting, tumor lysis as well as constipation were reviewed.  She is agreeable to proceed at this time.  Risks and benefits of a Port-A-Cath insertion was also reviewed.  Potential complications include bleeding, thrombosis and infection were discussed.  We will arrange for Port-A-Cath insertion in the immediate future and to start 5 azacitidine intravenously for 7 days every 4 weeks.

## 2020-01-11 ENCOUNTER — Inpatient Hospital Stay: Payer: No Typology Code available for payment source

## 2020-01-11 ENCOUNTER — Inpatient Hospital Stay: Payer: No Typology Code available for payment source | Attending: Oncology | Admitting: Oncology

## 2020-01-11 ENCOUNTER — Other Ambulatory Visit: Payer: Self-pay

## 2020-01-11 VITALS — BP 100/63 | HR 63 | Temp 97.1°F | Resp 18 | Ht 63.5 in | Wt 160.8 lb

## 2020-01-11 DIAGNOSIS — Z79899 Other long term (current) drug therapy: Secondary | ICD-10-CM | POA: Insufficient documentation

## 2020-01-11 DIAGNOSIS — D46C Myelodysplastic syndrome with isolated del(5q) chromosomal abnormality: Secondary | ICD-10-CM | POA: Diagnosis not present

## 2020-01-11 DIAGNOSIS — Z87891 Personal history of nicotine dependence: Secondary | ICD-10-CM | POA: Insufficient documentation

## 2020-01-11 DIAGNOSIS — R630 Anorexia: Secondary | ICD-10-CM | POA: Insufficient documentation

## 2020-01-11 DIAGNOSIS — D469 Myelodysplastic syndrome, unspecified: Secondary | ICD-10-CM

## 2020-01-11 DIAGNOSIS — R5383 Other fatigue: Secondary | ICD-10-CM | POA: Insufficient documentation

## 2020-01-11 DIAGNOSIS — E860 Dehydration: Secondary | ICD-10-CM | POA: Diagnosis not present

## 2020-01-11 DIAGNOSIS — R112 Nausea with vomiting, unspecified: Secondary | ICD-10-CM | POA: Insufficient documentation

## 2020-01-11 DIAGNOSIS — Z7982 Long term (current) use of aspirin: Secondary | ICD-10-CM | POA: Diagnosis not present

## 2020-01-11 DIAGNOSIS — Z8673 Personal history of transient ischemic attack (TIA), and cerebral infarction without residual deficits: Secondary | ICD-10-CM | POA: Diagnosis not present

## 2020-01-11 DIAGNOSIS — E876 Hypokalemia: Secondary | ICD-10-CM | POA: Insufficient documentation

## 2020-01-11 DIAGNOSIS — D649 Anemia, unspecified: Secondary | ICD-10-CM

## 2020-01-11 DIAGNOSIS — Z5111 Encounter for antineoplastic chemotherapy: Secondary | ICD-10-CM | POA: Diagnosis present

## 2020-01-11 LAB — CBC WITH DIFFERENTIAL (CANCER CENTER ONLY)
Abs Immature Granulocytes: 0.02 10*3/uL (ref 0.00–0.07)
Basophils Absolute: 0 10*3/uL (ref 0.0–0.1)
Basophils Relative: 1 %
Eosinophils Absolute: 0 10*3/uL (ref 0.0–0.5)
Eosinophils Relative: 2 %
HCT: 28.9 % — ABNORMAL LOW (ref 36.0–46.0)
Hemoglobin: 9.7 g/dL — ABNORMAL LOW (ref 12.0–15.0)
Immature Granulocytes: 1 %
Lymphocytes Relative: 53 %
Lymphs Abs: 0.8 10*3/uL (ref 0.7–4.0)
MCH: 33 pg (ref 26.0–34.0)
MCHC: 33.6 g/dL (ref 30.0–36.0)
MCV: 98.3 fL (ref 80.0–100.0)
Monocytes Absolute: 0.2 10*3/uL (ref 0.1–1.0)
Monocytes Relative: 12 %
Neutro Abs: 0.5 10*3/uL — ABNORMAL LOW (ref 1.7–7.7)
Neutrophils Relative %: 31 %
Platelet Count: 278 10*3/uL (ref 150–400)
RBC: 2.94 MIL/uL — ABNORMAL LOW (ref 3.87–5.11)
RDW: 15.9 % — ABNORMAL HIGH (ref 11.5–15.5)
WBC Count: 1.5 10*3/uL — ABNORMAL LOW (ref 4.0–10.5)
nRBC: 0 % (ref 0.0–0.2)

## 2020-01-11 LAB — CMP (CANCER CENTER ONLY)
ALT: 33 U/L (ref 0–44)
AST: 21 U/L (ref 15–41)
Albumin: 4 g/dL (ref 3.5–5.0)
Alkaline Phosphatase: 52 U/L (ref 38–126)
Anion gap: 10 (ref 5–15)
BUN: 12 mg/dL (ref 8–23)
CO2: 28 mmol/L (ref 22–32)
Calcium: 9.7 mg/dL (ref 8.9–10.3)
Chloride: 105 mmol/L (ref 98–111)
Creatinine: 0.97 mg/dL (ref 0.44–1.00)
GFR, Est AFR Am: >60 mL/min (ref >60)
GFR, Estimated: >60 mL/min (ref >60)
Glucose, Bld: 100 mg/dL — ABNORMAL HIGH (ref 70–99)
Potassium: 3.2 mmol/L — ABNORMAL LOW (ref 3.5–5.1)
Sodium: 143 mmol/L (ref 135–145)
Total Bilirubin: 0.9 mg/dL (ref 0.3–1.2)
Total Protein: 7.6 g/dL (ref 6.5–8.1)

## 2020-01-11 LAB — SAMPLE TO BLOOD BANK

## 2020-01-11 LAB — URIC ACID: Uric Acid, Serum: 5.6 mg/dL (ref 2.5–7.1)

## 2020-01-11 LAB — PHOSPHORUS: Phosphorus: 4.1 mg/dL (ref 2.5–4.6)

## 2020-01-11 LAB — LACTATE DEHYDROGENASE: LDH: 147 U/L (ref 98–192)

## 2020-01-11 NOTE — Progress Notes (Signed)
Hematology and Oncology Follow Up Visit  Oasis Fredrich [id number] March 13, 1956 64 y.o. 01/11/2020 9:12 AM Breanna Perry, MDSmith, Breanna Hope, MD   Principle Diagnosis: 64 year old woman with MDS presented with high risk disease with IPSS-R score of 6 and 12 to 14% blasts, 5 q. minus deletion diagnosed in February 2021.   Current therapy:   Supportive packed red cell transfusion. Under consideration to start of Vidaza in the near future.   Interim History: Breanna Perry is here for a follow-up visit.  Since the last visit, she was evaluated at [facility] for her diagnosis of high risk MDS.  It was recommended to start 5 days which will be planned to start the near future.  She denies any nausea, vomiting or abdominal pain.  She does report some fatigue and some tiredness.  She denies any recurrent infections or hospitalizations.     Medications: I have reviewed the patient's current medications.  Current Outpatient Medications  Medication Sig Dispense Refill  . ARIPiprazole (ABILIFY) 5 MG tablet 5 mg.    . allopurinol (ZYLOPRIM) 300 MG tablet Take 1 tablet (300 mg total) by mouth 2 (two) times daily. 60 tablet 0  . aspirin EC 81 MG EC tablet Take 1 tablet (81 mg total) by mouth daily.    Marland Kitchen atorvastatin (LIPITOR) 40 MG tablet Take 1 tablet (40 mg total) by mouth daily at 6 PM. (Patient taking differently: Take 40 mg by mouth daily. ) 30 tablet 2  . busPIRone (BUSPAR) 10 MG tablet Take 10 mg by mouth 2 (two) times daily.    . citalopram (CELEXA) 40 MG tablet Take 40 mg by mouth daily.   1  . colesevelam (WELCHOL) 625 MG tablet Take 1,875 mg by mouth 2 (two) times daily.    . cyanocobalamin 1000 MCG tablet Take by mouth.    . EQ GENTLE LAXATIVE 5 MG EC tablet See admin instructions.    . Eszopiclone 3 MG TABS Take 3 mg by mouth at bedtime as needed (sleep).     Marland Kitchen lidocaine-prilocaine (EMLA) cream Apply 1 application topically as needed. 30 g 0  . metoprolol  (TOPROL-XL) 50 MG 24 hr tablet Take 50 mg by mouth daily.     . Olmesartan-Amlodipine-HCTZ (TRIBENZOR) 40-10-25 MG TABS Take 1 tablet by mouth daily.     . pantoprazole (PROTONIX) 40 MG tablet Take 1 tablet (40 mg total) by mouth daily. 60 tablet 1  . polyethylene glycol-electrolytes (NULYTELY) 420 g solution See admin instructions.    . potassium chloride SA (KLOR-CON) 20 MEQ tablet Take 2 tablets (40 mEq total) by mouth 2 (two) times daily. 30 tablet 0  . prochlorperazine (COMPAZINE) 10 MG tablet Take 1 tablet (10 mg total) by mouth every 6 (six) hours as needed for nausea or vomiting. 30 tablet 0   No current facility-administered medications for this visit.     Allergies: No Known Allergies    Physical Exam: Blood pressure 100/63, pulse 63, temperature (!) 97.1 F (36.2 C), temperature source Temporal, resp. rate 18, height 5' 3.5" (1.613 m), weight 160 lb 12.8 oz (72.9 kg), SpO2 100 %.   ECOG: 1     General appearance: Alert, awake without any distress. Head: Atraumatic without abnormalities Oropharynx: Without any thrush or ulcers. Eyes: No scleral icterus. Lymph nodes: No lymphadenopathy noted in the cervical, supraclavicular, or axillary nodes Heart:regular rate and rhythm, without any murmurs or gallops.   Lung: Clear to auscultation without any rhonchi, wheezes or dullness to  percussion. Abdomin: Soft, nontender without any shifting dullness or ascites. Musculoskeletal: No clubbing or cyanosis. Neurological: No motor or sensory deficits. Skin: No rashes or lesions.      Lab Results: Lab Results  Component Value Date   WBC 1.5 (L) 01/11/2020   HGB 9.7 (L) 01/11/2020   HCT 28.9 (L) 01/11/2020   MCV 98.3 01/11/2020   PLT 278 01/11/2020     Chemistry      Component Value Date/Time   NA 139 12/21/2019 1141   NA 141 10/10/2018 1333   K 2.7 (LL) 12/21/2019 1141   CL 99 12/21/2019 1141   CO2 29 12/21/2019 1141   BUN 9 12/21/2019 1141   BUN 12 10/10/2018  1333   CREATININE 1.02 (H) 12/21/2019 1141   CREATININE 1.01 03/29/2014 1428      Component Value Date/Time   CALCIUM 9.7 12/21/2019 1141   ALKPHOS 49 09/29/2018 1140   AST 19 09/29/2018 1140   ALT 23 09/29/2018 1140   BILITOT 1.0 09/29/2018 1140       Impression and Plan:   64 year old woman with:  1.  MDS with 5 q. deletion and  IPSS-R score is 6 which indicates high risk disease diagnosed in February 2021.  Disease status was updated today.  Her case was discussed also with Dr. Florene Glen at [facility].  I recommended proceeding with by days for induction purposes.  Risks and benefits of this treatment were reviewed today in detail.  Complications include nausea, vomiting, constipation myelosuppression as well as neutropenia and sepsis.  She is agreeable to proceed and she will receive an intravenous infusion for 7 days every 4 weeks.  2.  Anemia: Hemoglobin is adequate today and she will not receive any transfusion.  3.  IV access: Risks and benefits of Port-A-Cath insertion was reviewed.  Potential complications include bleeding, thrombosis and infection.  She is agreeable to proceed.  4.  Tumor lysis syndrome prophylaxis: She is currently on allopurinol and will monitor her tumor lysis parameters.  5.  Prognosis: Aggressive measures are warranted at this time given her excellent performance status.  The only curative option would be stem cell transplant which will be considered see if she needs control of her blast count after 2 cycles of therapy.  6.  Antiemetics: Prescription for Compazine was given to her at this time.  7.  Follow-up: She will return in 1 week for the start of cycle 1 of Vidaza.  30  minutes were spent on this encounter.  The time was dedicated reviewing her disease status, reviewing laboratory data addressing complications related to her disease and therapy.    Breanna Button, MD 4/5/20219:12 AM

## 2020-01-12 NOTE — Progress Notes (Signed)
Pharmacist Chemotherapy Monitoring - Initial Assessment    Anticipated start date: 01/18/20    Regimen:  . Are orders appropriate based on the patient's diagnosis, regimen, and cycle? Yes . Does the plan date match the patient's scheduled date? Yes . Is the sequencing of drugs appropriate? Yes . Are the premedications appropriate for the patient's regimen? Yes . Prior Authorization for treatment is: Pending o If applicable, is the correct biosimilar selected based on the patient's insurance? not applicable  Organ Function and Labs: Marland Kitchen Are dose adjustments needed based on the patient's renal function, hepatic function, or hematologic function? No . Are appropriate labs ordered prior to the start of patient's treatment? Yes . Other organ system assessment, if indicated: N/A . The following baseline labs, if indicated, have been ordered: N/A  Dose Assessment: . Are the drug doses appropriate? Yes . Are the following correct: o Drug concentrations Yes o IV fluid compatible with drug Yes o Administration routes Yes o Timing of therapy Yes . If applicable, does the patient have documented access for treatment and/or plans for port-a-cath placement? not applicable . If applicable, have lifetime cumulative doses been properly documented and assessed? not applicable Lifetime Dose Tracking  No doses have been documented on this patient for the following tracked chemicals: Doxorubicin, Epirubicin, Idarubicin, Daunorubicin, Mitoxantrone, Bleomycin, Oxaliplatin, Carboplatin, Liposomal Doxorubicin  o   Toxicity Monitoring/Prevention: . The patient has the following take home antiemetics prescribed: Prochlorperazine . The patient has the following take home medications prescribed: N/A . Medication allergies and previous infusion related reactions, if applicable, have been reviewed and addressed. No . The patient's current medication list has been assessed for drug-drug interactions with their  chemotherapy regimen. no significant drug-drug interactions were identified on review.  Order Review: . Are the treatment plan orders signed? Yes . Is the patient scheduled to see a provider prior to their treatment? No  I verify that I have reviewed each item in the above checklist and answered each question accordingly.  Maurice Fotheringham D 01/12/2020 4:15 PM

## 2020-01-14 ENCOUNTER — Telehealth: Payer: Self-pay

## 2020-01-14 NOTE — Telephone Encounter (Signed)
-----   Message from Wyatt Portela, MD sent at 01/14/2020  2:32 PM EDT ----- Regarding: RE: [facility],  Can you please let the patient know that we cannot treat her here based on this information.  Can you also please call Dr. Abel Presto office and let them know that they need to treat her at [facility].  Thanks ----- Message ----- From: Blenda Peals Sent: 01/14/2020   2:16 PM EDT To: Elliot Gault, Wyatt Portela, MD, # Subject: [facility]                              [name]  Breanna Perry nurse called regarding authorization for chemo/oncology treatment for this patient. She confirmed that the auth on file is for the patient to have [facility]. The Josem Kaufmann is good for 6 months 12/23/2019-06/24/2020 and the facility can not be changed.  FYI.

## 2020-01-14 NOTE — Telephone Encounter (Signed)
Called and spoke to [place] at [facility]- Dr. Abel Presto office-[phone]. Informed Tiffany that patient will need chemo treatment at [facility] due to the fact that is where the authorization is valid. Tiffany stated she will forward the message to Dr. Florene Glen and his team. Also contacted patient to let her know that her authorization for treatment is at [facility] and the facility cannot be changed to [facility]. Tiffany stated Dr. Abel Presto team will follow up with the patient and update this office as well.

## 2020-01-14 NOTE — Progress Notes (Signed)
Pharmacist Chemotherapy Monitoring - Follow Up Assessment    I verify that I have reviewed each item in the below checklist:  . Regimen for the patient is scheduled for the appropriate day and plan matches scheduled date. Marland Kitchen Appropriate non-routine labs are ordered dependent on drug ordered. . If applicable, additional medications reviewed and ordered per protocol based on lifetime cumulative doses and/or treatment regimen.   Plan for follow-up and/or issues identified: No . I-vent associated with next due treatment: No .   Birch Farino D 01/14/2020 4:09 PM

## 2020-01-15 ENCOUNTER — Telehealth: Payer: Self-pay

## 2020-01-15 DIAGNOSIS — G4733 Obstructive sleep apnea (adult) (pediatric): Secondary | ICD-10-CM | POA: Diagnosis not present

## 2020-01-15 NOTE — Telephone Encounter (Signed)
Received a call from [place] at the [facility]- [phone]) and she stated that there has been a change and patient has authorization to receive chemo treatment at [facility] instead of [facility]. Per Lawerance Bach she will fax authorization to Drucie Opitz. Patient's canceled appointments have been rescheduled and patient is aware that treatment is now authorized for [facility]. Elliot Gault, Jenna Luo, [facility] all included on staff message about changes and made aware that per Dr. Alen Blew patient will start treatment on 01/18/20. Called and spoke to [place] at Dr. Abel Presto office and made her aware that patient will not need treatment there.

## 2020-01-15 NOTE — Progress Notes (Signed)
Pharmacist Chemotherapy Monitoring - Initial Assessment    Anticipated start date: 01/18/2020   Regimen:  . Are orders appropriate based on the patient's diagnosis, regimen, and cycle? Yes . Does the plan date match the patient's scheduled date? Yes . Is the sequencing of drugs appropriate? Yes . Are the premedications appropriate for the patient's regimen? Yes . Prior Authorization for treatment is: Approved o If applicable, is the correct biosimilar selected based on the patient's insurance? not applicable  Organ Function and Labs: Marland Kitchen Are dose adjustments needed based on the patient's renal function, hepatic function, or hematologic function? No . Are appropriate labs ordered prior to the start of patient's treatment? Yes . Other organ system assessment, if indicated: N/A . The following baseline labs, if indicated, have been ordered: N/A  Dose Assessment: . Are the drug doses appropriate? Yes . Are the following correct: o Drug concentrations Yes o IV fluid compatible with drug Yes o Administration routes Yes - confirmed IV route with MD, patient will be having port placed o Timing of therapy Yes . If applicable, does the patient have documented access for treatment and/or plans for port-a-cath placement? yes . If applicable, have lifetime cumulative doses been properly documented and assessed? not applicable Lifetime Dose Tracking  No doses have been documented on this patient for the following tracked chemicals: Doxorubicin, Epirubicin, Idarubicin, Daunorubicin, Mitoxantrone, Bleomycin, Oxaliplatin, Carboplatin, Liposomal Doxorubicin  o   Toxicity Monitoring/Prevention: . The patient has the following take home antiemetics prescribed: Prochlorperazine . The patient has the following take home medications prescribed: Tumor Lysis Syndrome prophylaxis . Medication allergies and previous infusion related reactions, if applicable, have been reviewed and addressed. Yes . The patient's  current medication list has been assessed for drug-drug interactions with their chemotherapy regimen. no significant drug-drug interactions were identified on review.  Order Review: . Are the treatment plan orders signed? Yes . Is the patient scheduled to see a provider prior to their treatment? No  I verify that I have reviewed each item in the above checklist and answered each question accordingly.  Norwood Levo [facility] 01/15/2020 3:40 PM

## 2020-01-17 ENCOUNTER — Other Ambulatory Visit: Payer: Self-pay | Admitting: Radiology

## 2020-01-18 ENCOUNTER — Encounter: Payer: Self-pay | Admitting: Oncology

## 2020-01-18 ENCOUNTER — Inpatient Hospital Stay: Payer: No Typology Code available for payment source

## 2020-01-18 ENCOUNTER — Other Ambulatory Visit: Payer: Self-pay

## 2020-01-18 VITALS — BP 105/64 | HR 68 | Temp 99.0°F | Resp 18

## 2020-01-18 DIAGNOSIS — D469 Myelodysplastic syndrome, unspecified: Secondary | ICD-10-CM

## 2020-01-18 DIAGNOSIS — Z5111 Encounter for antineoplastic chemotherapy: Secondary | ICD-10-CM | POA: Diagnosis not present

## 2020-01-18 DIAGNOSIS — D649 Anemia, unspecified: Secondary | ICD-10-CM

## 2020-01-18 LAB — CBC WITH DIFFERENTIAL/PLATELET
Abs Immature Granulocytes: 0.01 10*3/uL (ref 0.00–0.07)
Basophils Absolute: 0 10*3/uL (ref 0.0–0.1)
Basophils Relative: 0 %
Eosinophils Absolute: 0 10*3/uL (ref 0.0–0.5)
Eosinophils Relative: 1 %
HCT: 24.2 % — ABNORMAL LOW (ref 36.0–46.0)
Hemoglobin: 8.1 g/dL — ABNORMAL LOW (ref 12.0–15.0)
Immature Granulocytes: 1 %
Lymphocytes Relative: 56 %
Lymphs Abs: 0.9 10*3/uL (ref 0.7–4.0)
MCH: 32.5 pg (ref 26.0–34.0)
MCHC: 33.5 g/dL (ref 30.0–36.0)
MCV: 97.2 fL (ref 80.0–100.0)
Monocytes Absolute: 0.2 10*3/uL (ref 0.1–1.0)
Monocytes Relative: 14 %
Neutro Abs: 0.4 10*3/uL — CL (ref 1.7–7.7)
Neutrophils Relative %: 28 %
Platelets: 318 10*3/uL (ref 150–400)
RBC: 2.49 MIL/uL — ABNORMAL LOW (ref 3.87–5.11)
RDW: 15.9 % — ABNORMAL HIGH (ref 11.5–15.5)
WBC: 1.5 10*3/uL — ABNORMAL LOW (ref 4.0–10.5)
nRBC: 0 % (ref 0.0–0.2)

## 2020-01-18 LAB — CMP (CANCER CENTER ONLY)
ALT: 19 U/L (ref 0–44)
AST: 16 U/L (ref 15–41)
Albumin: 3.9 g/dL (ref 3.5–5.0)
Alkaline Phosphatase: 50 U/L (ref 38–126)
Anion gap: 7 (ref 5–15)
BUN: 8 mg/dL (ref 8–23)
CO2: 28 mmol/L (ref 22–32)
Calcium: 9.4 mg/dL (ref 8.9–10.3)
Chloride: 108 mmol/L (ref 98–111)
Creatinine: 0.84 mg/dL (ref 0.44–1.00)
GFR, Est AFR Am: >60 mL/min (ref >60)
GFR, Estimated: >60 mL/min (ref >60)
Glucose, Bld: 82 mg/dL (ref 70–99)
Potassium: 3.1 mmol/L — ABNORMAL LOW (ref 3.5–5.1)
Sodium: 143 mmol/L (ref 135–145)
Total Bilirubin: 0.8 mg/dL (ref 0.3–1.2)
Total Protein: 7.5 g/dL (ref 6.5–8.1)

## 2020-01-18 LAB — LACTATE DEHYDROGENASE: LDH: 168 U/L (ref 98–192)

## 2020-01-18 LAB — URIC ACID: Uric Acid, Serum: 3.7 mg/dL (ref 2.5–7.1)

## 2020-01-18 LAB — SAMPLE TO BLOOD BANK

## 2020-01-18 LAB — PHOSPHORUS: Phosphorus: 3.2 mg/dL (ref 2.5–4.6)

## 2020-01-18 MED ORDER — PALONOSETRON HCL INJECTION 0.25 MG/5ML
INTRAVENOUS | Status: AC
  Filled 2020-01-18: qty 5

## 2020-01-18 MED ORDER — SODIUM CHLORIDE 0.9 % IV SOLN
75.0000 mg/m2 | Freq: Once | INTRAVENOUS | Status: AC
  Administered 2020-01-18: 135 mg via INTRAVENOUS
  Filled 2020-01-18: qty 13.5

## 2020-01-18 MED ORDER — SODIUM CHLORIDE 0.9 % IV SOLN
10.0000 mg | Freq: Once | INTRAVENOUS | Status: AC
  Administered 2020-01-18: 14:00:00 10 mg via INTRAVENOUS
  Filled 2020-01-18: qty 10

## 2020-01-18 MED ORDER — PALONOSETRON HCL INJECTION 0.25 MG/5ML
0.2500 mg | Freq: Once | INTRAVENOUS | Status: AC
  Administered 2020-01-18: 14:00:00 0.25 mg via INTRAVENOUS

## 2020-01-18 MED ORDER — SODIUM CHLORIDE 0.9 % IV SOLN
Freq: Once | INTRAVENOUS | Status: AC
  Filled 2020-01-18: qty 250

## 2020-01-18 NOTE — Progress Notes (Signed)
Ok to tx with current labs per Dr Jana Hakim in Dr Alen Blew absence.  ANC=400 Hgb=8.1

## 2020-01-18 NOTE — Progress Notes (Signed)
Met with patient at registration to introduce myself as Financial Resource Specialist and to offer available resources. ° °Discussed one-time $1000 [employer] grant and qualifications to assist with personal expenses while going through treatment. ° °Gave her my card if interested in applying and for any additional financial questions or concerns. °

## 2020-01-18 NOTE — Patient Instructions (Signed)
[facility] Discharge Instructions for Patients Receiving Chemotherapy  Today you received the following chemotherapy agents: azacitidine.  To help prevent nausea and vomiting after your treatment, we encourage you to take your nausea medication as directed.   If you develop nausea and vomiting that is not controlled by your nausea medication, call the clinic.   BELOW ARE SYMPTOMS THAT SHOULD BE REPORTED IMMEDIATELY:  *FEVER GREATER THAN 100.5 F  *CHILLS WITH OR WITHOUT FEVER  NAUSEA AND VOMITING THAT IS NOT CONTROLLED WITH YOUR NAUSEA MEDICATION  *UNUSUAL SHORTNESS OF BREATH  *UNUSUAL BRUISING OR BLEEDING  TENDERNESS IN MOUTH AND THROAT WITH OR WITHOUT PRESENCE OF ULCERS  *URINARY PROBLEMS  *BOWEL PROBLEMS  UNUSUAL RASH Items with * indicate a potential emergency and should be followed up as soon as possible.  Feel free to call the clinic should you have any questions or concerns. The clinic phone number is [phone].  Please show the [place] at check-in to the Emergency Department and triage nurse.    Azacitidine suspension for injection (subcutaneous use) What is this medicine? AZACITIDINE (ay [place]) is a chemotherapy drug. This medicine reduces the growth of cancer cells and can suppress the immune system. It is used for treating myelodysplastic syndrome or some types of leukemia. This medicine may be used for other purposes; ask your health care provider or pharmacist if you have questions. COMMON BRAND NAME(S): Vidaza What should I tell my health care provider before I take this medicine? They need to know if you have any of these conditions:  kidney disease  liver disease  liver tumors  an unusual or allergic reaction to azacitidine, mannitol, other medicines, foods, dyes, or preservatives  pregnant or trying to get pregnant  breast-feeding How should I use this medicine? This medicine is for injection under the skin.  It is administered in a hospital or clinic by a specially trained health care professional. Talk to your pediatrician regarding the use of this medicine in children. While this drug may be prescribed for selected conditions, precautions do apply. Overdosage: If you think you have taken too much of this medicine contact a poison control center or emergency room at once. NOTE: This medicine is only for you. Do not share this medicine with others. What if I miss a dose? It is important not to miss your dose. Call your doctor or health care professional if you are unable to keep an appointment. What may interact with this medicine? Interactions have not been studied. Give your health care provider a list of all the medicines, herbs, non-prescription drugs, or dietary supplements you use. Also tell them if you smoke, drink alcohol, or use illegal drugs. Some items may interact with your medicine. This list may not describe all possible interactions. Give your health care provider a list of all the medicines, herbs, non-prescription drugs, or dietary supplements you use. Also tell them if you smoke, drink alcohol, or use illegal drugs. Some items may interact with your medicine. What should I watch for while using this medicine? Visit your doctor for checks on your progress. This drug may make you feel generally unwell. This is not uncommon, as chemotherapy can affect healthy cells as well as cancer cells. Report any side effects. Continue your course of treatment even though you feel ill unless your doctor tells you to stop. In some cases, you may be given additional medicines to help with side effects. Follow all directions for their use.  Call your doctor or health care professional for advice if you get a fever, chills or sore throat, or other symptoms of a cold or flu. Do not treat yourself. This drug decreases your body's ability to fight infections. Try to avoid being around people who are sick. This  medicine may increase your risk to bruise or bleed. Call your doctor or health care professional if you notice any unusual bleeding. You may need blood work done while you are taking this medicine. Do not become pregnant while taking this medicine and for 6 months after the last dose. Women should inform their doctor if they wish to become pregnant or think they might be pregnant. Men should not father a child while taking this medicine and for 3 months after the last dose. There is a potential for serious side effects to an unborn child. Talk to your health care professional or pharmacist for more information. Do not breast-feed an infant while taking this medicine and for 1 week after the last dose. This medicine may interfere with the ability to have a child. Talk with your doctor or health care professional if you are concerned about your fertility. What side effects may I notice from receiving this medicine? Side effects that you should report to your doctor or health care professional as soon as possible:  allergic reactions like skin rash, itching or hives, swelling of the face, lips, or tongue  low blood counts - this medicine may decrease the number of white blood cells, red blood cells and platelets. You may be at increased risk for infections and bleeding.  signs of infection - fever or chills, cough, sore throat, pain passing urine  signs of decreased platelets or bleeding - bruising, pinpoint red spots on the skin, black, tarry stools, blood in the urine  signs of decreased red blood cells - unusually weak or tired, fainting spells, lightheadedness  signs and symptoms of kidney injury like trouble passing urine or change in the amount of urine  signs and symptoms of liver injury like dark yellow or brown urine; general ill feeling or flu-like symptoms; light-colored stools; loss of appetite; nausea; right upper belly pain; unusually weak or tired; yellowing of the eyes or skin Side  effects that usually do not require medical attention (report to your doctor or health care professional if they continue or are bothersome):  constipation  diarrhea  nausea, vomiting  pain or redness at the injection site  unusually weak or tired This list may not describe all possible side effects. Call your doctor for medical advice about side effects. You may report side effects to FDA at 1-800-FDA-1088. Where should I keep my medicine? This drug is given in a hospital or clinic and will not be stored at home. NOTE: This sheet is a summary. It may not cover all possible information. If you have questions about this medicine, talk to your doctor, pharmacist, or health care provider.  2020 Elsevier/Gold Standard (2016-10-23 14:37:51)

## 2020-01-19 ENCOUNTER — Ambulatory Visit (HOSPITAL_COMMUNITY)
Admission: RE | Admit: 2020-01-19 | Discharge: 2020-01-19 | Disposition: A | Discharge status: Home / Self Care | Payer: No Typology Code available for payment source | Source: Ambulatory Visit | Attending: Family Medicine | Admitting: Family Medicine

## 2020-01-19 ENCOUNTER — Inpatient Hospital Stay: Payer: No Typology Code available for payment source

## 2020-01-19 ENCOUNTER — Ambulatory Visit: Payer: No Typology Code available for payment source

## 2020-01-19 ENCOUNTER — Other Ambulatory Visit: Payer: Self-pay | Admitting: Oncology

## 2020-01-19 ENCOUNTER — Other Ambulatory Visit: Payer: Self-pay

## 2020-01-19 ENCOUNTER — Encounter (HOSPITAL_COMMUNITY): Payer: Self-pay

## 2020-01-19 ENCOUNTER — Ambulatory Visit (HOSPITAL_COMMUNITY)
Admission: RE | Admit: 2020-01-19 | Discharge: 2020-01-19 | Disposition: A | Discharge status: Home / Self Care | Payer: No Typology Code available for payment source | Source: Ambulatory Visit | Attending: Oncology | Admitting: Oncology

## 2020-01-19 VITALS — BP 99/56 | HR 71 | Temp 98.7°F | Resp 16

## 2020-01-19 DIAGNOSIS — D469 Myelodysplastic syndrome, unspecified: Secondary | ICD-10-CM | POA: Diagnosis present

## 2020-01-19 DIAGNOSIS — Z8673 Personal history of transient ischemic attack (TIA), and cerebral infarction without residual deficits: Secondary | ICD-10-CM | POA: Diagnosis not present

## 2020-01-19 DIAGNOSIS — Z87891 Personal history of nicotine dependence: Secondary | ICD-10-CM | POA: Diagnosis not present

## 2020-01-19 DIAGNOSIS — Z7982 Long term (current) use of aspirin: Secondary | ICD-10-CM | POA: Insufficient documentation

## 2020-01-19 DIAGNOSIS — F329 Major depressive disorder, single episode, unspecified: Secondary | ICD-10-CM | POA: Insufficient documentation

## 2020-01-19 DIAGNOSIS — I10 Essential (primary) hypertension: Secondary | ICD-10-CM | POA: Insufficient documentation

## 2020-01-19 DIAGNOSIS — Z79899 Other long term (current) drug therapy: Secondary | ICD-10-CM | POA: Diagnosis not present

## 2020-01-19 DIAGNOSIS — F419 Anxiety disorder, unspecified: Secondary | ICD-10-CM | POA: Insufficient documentation

## 2020-01-19 DIAGNOSIS — M3500 Sicca syndrome, unspecified: Secondary | ICD-10-CM | POA: Insufficient documentation

## 2020-01-19 HISTORY — PX: IR IMAGING GUIDED PORT INSERTION: IMG5740

## 2020-01-19 LAB — BASIC METABOLIC PANEL
Anion gap: 9 (ref 5–15)
BUN: 12 mg/dL (ref 8–23)
CO2: 27 mmol/L (ref 22–32)
Calcium: 9.5 mg/dL (ref 8.9–10.3)
Chloride: 104 mmol/L (ref 98–111)
Creatinine, Ser: 0.76 mg/dL (ref 0.44–1.00)
GFR calc Af Amer: >60 mL/min (ref >60)
GFR calc non Af Amer: >60 mL/min (ref >60)
Glucose, Bld: 95 mg/dL (ref 70–99)
Potassium: 3 mmol/L — ABNORMAL LOW (ref 3.5–5.1)
Sodium: 140 mmol/L (ref 135–145)

## 2020-01-19 LAB — CBC WITH DIFFERENTIAL/PLATELET
Abs Immature Granulocytes: 0.08 10*3/uL — ABNORMAL HIGH (ref 0.00–0.07)
Basophils Absolute: 0 10*3/uL (ref 0.0–0.1)
Basophils Relative: 0 %
Eosinophils Absolute: 0 10*3/uL (ref 0.0–0.5)
Eosinophils Relative: 0 %
HCT: 24.6 % — ABNORMAL LOW (ref 36.0–46.0)
Hemoglobin: 8.2 g/dL — ABNORMAL LOW (ref 12.0–15.0)
Immature Granulocytes: 5 %
Lymphocytes Relative: 29 %
Lymphs Abs: 0.5 10*3/uL — ABNORMAL LOW (ref 0.7–4.0)
MCH: 32.3 pg (ref 26.0–34.0)
MCHC: 33.3 g/dL (ref 30.0–36.0)
MCV: 96.9 fL (ref 80.0–100.0)
Monocytes Absolute: 0.2 10*3/uL (ref 0.1–1.0)
Monocytes Relative: 12 %
Neutro Abs: 1 10*3/uL — ABNORMAL LOW (ref 1.7–7.7)
Neutrophils Relative %: 54 %
Platelets: 329 10*3/uL (ref 150–400)
RBC: 2.54 MIL/uL — ABNORMAL LOW (ref 3.87–5.11)
RDW: 15.8 % — ABNORMAL HIGH (ref 11.5–15.5)
WBC: 1.7 10*3/uL — ABNORMAL LOW (ref 4.0–10.5)
nRBC: 0 % (ref 0.0–0.2)

## 2020-01-19 LAB — PROTIME-INR
INR: 0.9 (ref 0.8–1.2)
Prothrombin Time: 12.4 seconds (ref 11.4–15.2)

## 2020-01-19 MED ORDER — FENTANYL CITRATE (PF) 100 MCG/2ML IJ SOLN
INTRAMUSCULAR | Status: AC | PRN
  Administered 2020-01-19: 50 ug via INTRAVENOUS

## 2020-01-19 MED ORDER — SODIUM CHLORIDE 0.9 % IV SOLN
Freq: Once | INTRAVENOUS | Status: AC
  Filled 2020-01-19: qty 250

## 2020-01-19 MED ORDER — LIDOCAINE HCL (PF) 1 % IJ SOLN
INTRAMUSCULAR | Status: AC | PRN
  Administered 2020-01-19: 10 mL

## 2020-01-19 MED ORDER — MIDAZOLAM HCL 2 MG/2ML IJ SOLN
INTRAMUSCULAR | Status: AC | PRN
  Administered 2020-01-19 (×2): 1 mg via INTRAVENOUS

## 2020-01-19 MED ORDER — CEFAZOLIN SODIUM-DEXTROSE 2-4 GM/100ML-% IV SOLN
2.0000 g | INTRAVENOUS | Status: AC
  Administered 2020-01-19: 2 g via INTRAVENOUS

## 2020-01-19 MED ORDER — MIDAZOLAM HCL 2 MG/2ML IJ SOLN
INTRAMUSCULAR | Status: AC
  Filled 2020-01-19: qty 4

## 2020-01-19 MED ORDER — FENTANYL CITRATE (PF) 100 MCG/2ML IJ SOLN
INTRAMUSCULAR | Status: AC
  Filled 2020-01-19: qty 2

## 2020-01-19 MED ORDER — SODIUM CHLORIDE 0.9% FLUSH
10.0000 mL | INTRAVENOUS | Status: DC | PRN
  Administered 2020-01-19: 16:00:00 10 mL
  Filled 2020-01-19: qty 10

## 2020-01-19 MED ORDER — SODIUM CHLORIDE 0.9 % IV SOLN: INTRAVENOUS | Status: DC

## 2020-01-19 MED ORDER — HEPARIN SOD (PORK) LOCK FLUSH 100 UNIT/ML IV SOLN
500.0000 [IU] | Freq: Once | INTRAVENOUS | Status: AC | PRN
  Administered 2020-01-19: 500 [IU]
  Filled 2020-01-19: qty 5

## 2020-01-19 MED ORDER — CEFAZOLIN SODIUM-DEXTROSE 2-4 GM/100ML-% IV SOLN
INTRAVENOUS | Status: AC
  Filled 2020-01-19: qty 100

## 2020-01-19 MED ORDER — LIDOCAINE HCL 1 % IJ SOLN
INTRAMUSCULAR | Status: AC
  Filled 2020-01-19: qty 20

## 2020-01-19 MED ORDER — HEPARIN SOD (PORK) LOCK FLUSH 100 UNIT/ML IV SOLN
INTRAVENOUS | Status: AC
  Filled 2020-01-19: qty 5

## 2020-01-19 MED ORDER — SODIUM CHLORIDE 0.9 % IV SOLN
75.0000 mg/m2 | Freq: Once | INTRAVENOUS | Status: AC
  Administered 2020-01-19: 135 mg via INTRAVENOUS
  Filled 2020-01-19: qty 13.5

## 2020-01-19 MED ORDER — SODIUM CHLORIDE 0.9 % IV SOLN
10.0000 mg | Freq: Once | INTRAVENOUS | Status: AC
  Administered 2020-01-19: 10 mg via INTRAVENOUS
  Filled 2020-01-19: qty 10

## 2020-01-19 NOTE — H&P (Signed)
Chief Complaint: Patient was seen in consultation today for port placement  Referring Physician(s): Wyatt Portela  Supervising Physician: Corrie Mckusick  Patient Status: [facility] - Out-pt  History of Present Illness: Breanna Perry is a 64 y.o. female with a past medical history significant for anxiety, depression, anemia, Sjogren's syndrome, CVA, HTN, loop recorder placement (2020), tumor lysis syndrome and MDS followed by Dr. Alen Blew who presents today for port placement in order to begin chemotherapy.   [name] denies any complaints today, she is nervous about the procedure but is agreeable to proceed as planned. She has a rash on her chest that is red and itchy, not sure when it first started but was recently, no other areas of rash noted.   Past Medical History:  Diagnosis Date  . Anemia   . Anxiety   . Dental bridge present    upper and lower  . Depression   . Hypertension    under control with med., has been on med. x 8 yr.  . Leukocytopenia   . Medial meniscus tear 12/2012   left  . Sjogren's syndrome ([place])   . Stroke ([place])   . Wears glasses    reading    Past Surgical History:  Procedure Laterality Date  . APPENDECTOMY  04/1991  . BIOPSY  11/20/2019   Procedure: BIOPSY;  Surgeon: Ronnette Juniper, MD;  Location: [facility];  Service: Gastroenterology;;  . COLONOSCOPY    . COLONOSCOPY WITH PROPOFOL N/A 11/20/2019   Procedure: COLONOSCOPY WITH PROPOFOL;  Surgeon: Ronnette Juniper, MD;  Location: [facility];  Service: Gastroenterology;  Laterality: N/A;  . ESOPHAGOGASTRODUODENOSCOPY (EGD) WITH PROPOFOL N/A 11/20/2019   Procedure: ESOPHAGOGASTRODUODENOSCOPY (EGD) WITH PROPOFOL;  Surgeon: Ronnette Juniper, MD;  Location: [facility];  Service: Gastroenterology;  Laterality: N/A;  . KNEE ARTHROSCOPY Left 01/07/2013   Procedure: LEFT KNEE ARTHROSCOPY PARTIAL MEDIAL MENISECTOMY CHRONDOPLASTY MEDIAL PLICA RESECTION;  Surgeon: Alta Corning, MD;  Location: [place];   Service: Orthopedics;  Laterality: Left;  . KNEE SURGERY Left 06/22/2015  . LIVER BIOPSY  summer 2011  . LOOP RECORDER INSERTION N/A 10/21/2018   Procedure: LOOP RECORDER INSERTION;  Surgeon: Thompson Grayer, MD;  Location: [facility];  Service: Cardiovascular;  Laterality: N/A;  . LUNG BIOPSY  summer 2011  . PARTIAL HYSTERECTOMY  04/1991   partial  . TEE WITHOUT CARDIOVERSION N/A 10/21/2018   Procedure: TRANSESOPHAGEAL ECHOCARDIOGRAM (TEE);  Surgeon: Elouise Munroe, MD;  Location: [place];  Service: Cardiology;  Laterality: N/A;  possible loop recorder    Allergies: Patient has no known allergies.  Medications: Prior to Admission medications   Medication Sig Start Date End Date Taking? Authorizing Provider  allopurinol (ZYLOPRIM) 300 MG tablet Take 1 tablet (300 mg total) by mouth 2 (two) times daily. 01/04/20  Yes Wyatt Portela, MD  ARIPiprazole (ABILIFY) 5 MG tablet 5 mg. 04/02/18  Yes [provider]  aspirin EC 81 MG EC tablet Take 1 tablet (81 mg total) by mouth daily. 10/01/18  Yes Donzetta Starch, NP  atorvastatin (LIPITOR) 40 MG tablet Take 1 tablet (40 mg total) by mouth daily at 6 PM. Patient taking differently: Take 40 mg by mouth daily.  09/30/18  Yes Donzetta Starch, NP  betamethasone dipropionate 0.05 % cream Apply topically 2 (two) times daily. Recently started   Yes [provider]  busPIRone (BUSPAR) 10 MG tablet Take 10 mg by mouth 2 (two) times daily. 01/01/20  Yes [provider]  citalopram (CELEXA) 40 MG tablet Take 40 mg by mouth daily.  04/09/17  Yes [provider]  cyanocobalamin 1000 MCG tablet Take by mouth.   Yes [provider]  Eszopiclone 3 MG TABS Take 3 mg by mouth at bedtime as needed (sleep).  10/02/19  Yes [provider]  hydrocortisone 2.5 % cream Apply 1 application topically 2 (two) times daily. resently started   Yes [provider]  metoprolol (TOPROL-XL) 50 MG 24 hr tablet  Take 50 mg by mouth daily.    Yes [provider]  Olmesartan-Amlodipine-HCTZ (TRIBENZOR) 40-10-25 MG TABS Take 1 tablet by mouth daily.    Yes [provider]  pantoprazole (PROTONIX) 40 MG tablet Take 1 tablet (40 mg total) by mouth daily. 11/20/19 11/19/20 Yes Ronnette Juniper, MD  colesevelam [facility]) 625 MG tablet Take 1,875 mg by mouth 2 (two) times daily.    [provider]  EQ GENTLE LAXATIVE 5 MG EC tablet See admin instructions. 11/18/19   [provider]  lidocaine-prilocaine (EMLA) cream Apply 1 application topically as needed. 01/04/20   Wyatt Portela, MD  polyethylene glycol-electrolytes (NULYTELY) 420 g solution See admin instructions. 11/18/19   [provider]  potassium chloride SA (KLOR-CON) 20 MEQ tablet Take 2 tablets (40 mEq total) by mouth 2 (two) times daily. 12/21/19   Pattricia Boss, MD  prochlorperazine (COMPAZINE) 10 MG tablet Take 1 tablet (10 mg total) by mouth every 6 (six) hours as needed for nausea or vomiting. 01/04/20   Wyatt Portela, MD     Family History  Problem Relation Age of Onset  . Hypertension Father   . Emphysema Father   . Heart attack Father   . Cirrhosis Father   . Coronary artery disease Mother   . Heart failure Mother   . COPD Mother   . Breast cancer Maternal Grandmother   . HIV Brother   . Diabetes Sister     Social History   Socioeconomic History  . Marital status: Single    Spouse name: Not on file  . Number of children: N  . Years of education: Not on file  . Highest education level: Not on file  Occupational History  . Occupation: parts specialist [employer]   Tobacco Use  . Smoking status: Former Smoker    Packs/day: 1.00    Years: 31.00    Pack years: 31.00    Types: Cigarettes    Quit date: 10/08/2000    Years since quitting: 19.2  . Smokeless tobacco: Never Used  Substance and Sexual Activity  . Alcohol use: Not Currently    Comment: rarely  . Drug use: No  . Sexual  activity: Not on file  Other Topics Concern  . Not on file  Social History Narrative  . Not on file   Social Determinants of Health   Financial Resource Strain:   . Difficulty of Paying Living Expenses:   Food Insecurity:   . Worried About Charity fundraiser in the Last Year:   . Arboriculturist in the Last Year:   Transportation Needs:   . Film/video editor (Medical):   Marland Kitchen Lack of Transportation (Non-Medical):   Physical Activity:   . Days of Exercise per Week:   . Minutes of Exercise per Session:   Stress:   . Feeling of Stress :   Social Connections:   . Frequency of Communication with Friends and Family:   . Frequency of Social Gatherings  with Friends and Family:   . Attends Religious Services:   . Active Member of Clubs or Organizations:   . Attends Archivist Meetings:   Marland Kitchen Marital Status:      Review of Systems: A 12 point ROS discussed and pertinent positives are indicated in the HPI above.  All other systems are negative.  Review of Systems  Constitutional: Negative for chills and fever.  Respiratory: Negative for cough and shortness of breath.   Cardiovascular: Negative for chest pain.  Gastrointestinal: Negative for abdominal pain, diarrhea, nausea and vomiting.  Musculoskeletal: Negative for back pain.  Skin: Positive for rash (chest). Negative for color change and wound.  Neurological: Negative for dizziness and headaches.    Vital Signs: Ht 5\' 3"  (1.6 m)   Wt 160 lb 11.5 oz (72.9 kg)   BMI 28.47 kg/m   Physical Exam Vitals reviewed.  Constitutional:      General: She is not in acute distress. HENT:     Head: Normocephalic.     Mouth/Throat:     Mouth: Mucous membranes are moist.     Pharynx: Oropharynx is clear. No oropharyngeal exudate or posterior oropharyngeal erythema.  Cardiovascular:     Rate and Rhythm: Normal rate and regular rhythm.  Pulmonary:     Effort: Pulmonary effort is normal.     Breath sounds: Normal breath  sounds.  Abdominal:     General: There is no distension.     Palpations: Abdomen is soft.     Tenderness: There is no abdominal tenderness.  Skin:    General: Skin is warm and dry.     Findings: Rash (bilateral chest (L>R)) present.  Neurological:     Mental Status: She is alert and oriented to person, place, and time.  Psychiatric:        Mood and Affect: Mood normal.        Behavior: Behavior normal.        Thought Content: Thought content normal.        Judgment: Judgment normal.      MD Evaluation Airway: WNL Heart: WNL Abdomen: WNL Chest/ Lungs: WNL ASA  Classification: 2 Mallampati/Airway Score: Two   Imaging: No results found.  Labs:  CBC: Recent Labs    12/21/19 1141 12/23/19 0942 01/11/20 0837 01/18/20 1213  WBC 1.5* 1.6* 1.5* 1.5*  HGB 7.7* 7.3* 9.7* 8.1*  HCT 24.0* 22.4* 28.9* 24.2*  PLT 266 268 278 318    COAGS: No results for input(s): INR, APTT in the last 8760 hours.  BMP: Recent Labs    11/20/19 1010 12/21/19 1141 01/11/20 0837 01/18/20 1213  NA 138 139 143 143  K 3.3* 2.7* 3.2* 3.1*  CL 100 99 105 108  CO2 27 29 28 28   GLUCOSE 96 114* 100* 82  BUN 16 9 12 8   CALCIUM 9.2 9.7 9.7 9.4  CREATININE 1.67* 1.02* 0.97 0.84  GFRNONAA 32* 59* >60 >60  GFRAA 38* >60 >60 >60    LIVER FUNCTION TESTS: Recent Labs    01/11/20 0837 01/18/20 1213  BILITOT 0.9 0.8  AST 21 16  ALT 33 19  ALKPHOS 52 50  PROT 7.6 7.5  ALBUMIN 4.0 3.9    TUMOR MARKERS: No results for input(s): AFPTM, CEA, CA199, CHROMGRNA in the last 8760 hours.  Assessment and Plan:  64 y/o F with history of MDS followed by Dr. Alen Blew planned for chemotherapy who presents today for port placement.  Patient has been NPO since 8  pm last night, she does not take any blood thinning medications. Afebrile, pre-procedure labs pending and will be reviewed prior to proceeding. Given bilateral chest rash will have MD evaluate and determine if it is safe to proceed with port  placement today - please see attestation at the top of this note.  Risks and benefits of image-guided Port-a-catheter placement were discussed with the patient including, but not limited to bleeding, infection, pneumothorax, or fibrin sheath development and need for additional procedures.  All of the patient's questions were answered, patient is agreeable to proceed.  Consent signed and in chart.  Thank you for this interesting consult.  I greatly enjoyed meeting Jamere Hallowell and look forward to participating in their care.  A copy of this report was sent to the requesting provider on this date.  Electronically Signed: Joaquim Nam, PA-C 01/19/2020, 11:11 AM   I spent a total of 30 Minutes   in face to face in clinical consultation, greater than 50% of which was counseling/coordinating care for port placement.

## 2020-01-19 NOTE — Patient Instructions (Signed)
[facility] Discharge Instructions for Patients Receiving Chemotherapy  Today you received the following chemotherapy agents: azacitidine.  To help prevent nausea and vomiting after your treatment, we encourage you to take your nausea medication as directed.   If you develop nausea and vomiting that is not controlled by your nausea medication, call the clinic.   BELOW ARE SYMPTOMS THAT SHOULD BE REPORTED IMMEDIATELY:  *FEVER GREATER THAN 100.5 F  *CHILLS WITH OR WITHOUT FEVER  NAUSEA AND VOMITING THAT IS NOT CONTROLLED WITH YOUR NAUSEA MEDICATION  *UNUSUAL SHORTNESS OF BREATH  *UNUSUAL BRUISING OR BLEEDING  TENDERNESS IN MOUTH AND THROAT WITH OR WITHOUT PRESENCE OF ULCERS  *URINARY PROBLEMS  *BOWEL PROBLEMS  UNUSUAL RASH Items with * indicate a potential emergency and should be followed up as soon as possible.  Feel free to call the clinic should you have any questions or concerns. The clinic phone number is [phone].  Please show the CHEMO ALERT CARD at check-in to the Emergency Department and triage nurse.   

## 2020-01-19 NOTE — Progress Notes (Signed)
Pharmacist Chemotherapy Monitoring - Follow Up Assessment    I verify that I have reviewed each item in the below checklist:  . Regimen for the patient is scheduled for the appropriate day and plan matches scheduled date. Marland Kitchen Appropriate non-routine labs are ordered dependent on drug ordered. . If applicable, additional medications reviewed and ordered per protocol based on lifetime cumulative doses and/or treatment regimen.   Plan for follow-up and/or issues identified: No . I-vent associated with next due treatment: No . MD and/or nursing notified: No  Carolan Avedisian D 01/19/2020 4:20 PM

## 2020-01-19 NOTE — Discharge Instructions (Signed)
NO EMLA Cream for 2 weeks. For questions about port may contact Interventional Radiology staff /ON-Call  At [phone]   Implanted Port Insertion, Care After This sheet gives you information about how to care for yourself after your procedure. Your health care provider may also give you more specific instructions. If you have problems or questions, contact your health care provider. What can I expect after the procedure? After the procedure, it is common to have:  Discomfort at the port insertion site.  Bruising on the skin over the port. This should improve over 3-4 days. Follow these instructions at home: [facility] care  After your port is placed, you will get a manufacturer's information card. The card has information about your port. Keep this card with you at all times.  Take care of the port as told by your health care provider. Ask your health care provider if you or a family member can get training for taking care of the port at home. A home health care nurse may also take care of the port.  Make sure to remember what type of port you have. Incision care      Follow instructions from your health care provider about how to take care of your port insertion site. Make sure you: ? Wash your hands with soap and water before and after you change your bandage (dressing). If soap and water are not available, use hand sanitizer. ? Change your dressing as told by your health care provider. ? Leave stitches (sutures), skin glue, or adhesive strips in place. These skin closures may need to stay in place for 2 weeks or longer. If adhesive strip edges start to loosen and curl up, you may trim the loose edges. Do not remove adhesive strips completely unless your health care provider tells you to do that.  Check your port insertion site every day for signs of infection. Check for: ? Redness, swelling, or pain. ? Fluid or blood. ? Warmth. ? Pus or a bad smell. Activity  Return to your normal  activities as told by your health care provider. Ask your health care provider what activities are safe for you.  Do not lift anything that is heavier than 10 lb (4.5 kg), or the limit that you are told, until your health care provider says that it is safe. General instructions  Take over-the-counter and prescription medicines only as told by your health care provider.  Do not take baths, swim, or use a hot tub until your health care provider approves. Ask your health care provider if you may take showers. You may only be allowed to take sponge baths.  Do not drive for 24 hours if you were given a sedative during your procedure.  Wear a medical alert bracelet in case of an emergency. This will tell any health care providers that you have a port.  Keep all follow-up visits as told by your health care provider. This is important. Contact a health care provider if:  You cannot flush your port with saline as directed, or you cannot draw blood from the port.  You have a fever or chills.  You have redness, swelling, or pain around your port insertion site.  You have fluid or blood coming from your port insertion site.  Your port insertion site feels warm to the touch.  You have pus or a bad smell coming from the port insertion site. Get help right away if:  You have chest pain or shortness of breath.  You  have bleeding from your port that you cannot control. Summary  Take care of the port as told by your health care provider. Keep the manufacturer's information card with you at all times.  Change your dressing as told by your health care provider.  Contact a health care provider if you have a fever or chills or if you have redness, swelling, or pain around your port insertion site.  Keep all follow-up visits as told by your health care provider. This information is not intended to replace advice given to you by your health care provider. Make sure you discuss any questions you have  with your health care provider. Document Revised: 04/22/2018 Document Reviewed: 04/22/2018 Elsevier Patient Education  [place]. Moderate Conscious Sedation, Adult, Care After These instructions provide you with information about caring for yourself after your procedure. Your health care provider may also give you more specific instructions. Your treatment has been planned according to current medical practices, but problems sometimes occur. Call your health care provider if you have any problems or questions after your procedure. What can I expect after the procedure? After your procedure, it is common:  To feel sleepy for several hours.  To feel clumsy and have poor balance for several hours.  To have poor judgment for several hours.  To vomit if you eat too soon. Follow these instructions at home: For at least 24 hours after the procedure:   Do not: ? Participate in activities where you could fall or become injured. ? Drive. ? Use heavy machinery. ? Drink alcohol. ? Take sleeping pills or medicines that cause drowsiness. ? Make important decisions or sign legal documents. ? Take care of children on your own.  Rest. Eating and drinking  Follow the diet recommended by your health care provider.  If you vomit: ? Drink water, juice, or soup when you can drink without vomiting. ? Make sure you have little or no nausea before eating solid foods. General instructions  Have a responsible adult stay with you until you are awake and alert.  Take over-the-counter and prescription medicines only as told by your health care provider.  If you smoke, do not smoke without supervision.  Keep all follow-up visits as told by your health care provider. This is important. Contact a health care provider if:  You keep feeling nauseous or you keep vomiting.  You feel light-headed.  You develop a rash.  You have a fever. Get help right away if:  You have trouble  breathing. This information is not intended to replace advice given to you by your health care provider. Make sure you discuss any questions you have with your health care provider. Document Revised: 09/06/2017 Document Reviewed: 01/14/2016 Elsevier Patient Education  2020 [employer].

## 2020-01-19 NOTE — Procedures (Signed)
Interventional Radiology Procedure Note  Procedure: Placement of a right IJ approach single lumen PowerPort.  Tip is positioned at the superior cavoatrial junction and catheter is ready for immediate use.  Complications: None Recommendations:  - Ok to shower tomorrow - Do not submerge for 7 days - Routine line care   Signed,  Akaylah Lalley S. Duchess Armendarez, DO   

## 2020-01-20 ENCOUNTER — Other Ambulatory Visit: Payer: Self-pay | Admitting: Oncology

## 2020-01-20 ENCOUNTER — Ambulatory Visit: Payer: No Typology Code available for payment source

## 2020-01-20 ENCOUNTER — Other Ambulatory Visit: Payer: Self-pay

## 2020-01-20 ENCOUNTER — Inpatient Hospital Stay: Payer: No Typology Code available for payment source

## 2020-01-20 VITALS — BP 90/55 | HR 62 | Temp 98.9°F | Resp 18

## 2020-01-20 DIAGNOSIS — Z5111 Encounter for antineoplastic chemotherapy: Secondary | ICD-10-CM | POA: Diagnosis not present

## 2020-01-20 DIAGNOSIS — D469 Myelodysplastic syndrome, unspecified: Secondary | ICD-10-CM

## 2020-01-20 MED ORDER — SODIUM CHLORIDE 0.9 % IV SOLN
Freq: Once | INTRAVENOUS | Status: AC
  Filled 2020-01-20: qty 250

## 2020-01-20 MED ORDER — PALONOSETRON HCL INJECTION 0.25 MG/5ML
INTRAVENOUS | Status: AC
  Filled 2020-01-20: qty 5

## 2020-01-20 MED ORDER — SODIUM CHLORIDE 0.9 % IV SOLN
75.0000 mg/m2 | Freq: Once | INTRAVENOUS | Status: AC
  Administered 2020-01-20: 135 mg via INTRAVENOUS
  Filled 2020-01-20: qty 13.5

## 2020-01-20 MED ORDER — SODIUM CHLORIDE 0.9% FLUSH
10.0000 mL | INTRAVENOUS | Status: DC | PRN
  Administered 2020-01-20: 10 mL
  Filled 2020-01-20: qty 10

## 2020-01-20 MED ORDER — SODIUM CHLORIDE 0.9 % IV SOLN
10.0000 mg | Freq: Once | INTRAVENOUS | Status: AC
  Administered 2020-01-20: 10 mg via INTRAVENOUS
  Filled 2020-01-20: qty 10

## 2020-01-20 MED ORDER — HEPARIN SOD (PORK) LOCK FLUSH 100 UNIT/ML IV SOLN
500.0000 [IU] | Freq: Once | INTRAVENOUS | Status: AC | PRN
  Administered 2020-01-20: 500 [IU]
  Filled 2020-01-20: qty 5

## 2020-01-20 MED ORDER — PALONOSETRON HCL INJECTION 0.25 MG/5ML
0.2500 mg | Freq: Once | INTRAVENOUS | Status: AC
  Administered 2020-01-20: 0.25 mg via INTRAVENOUS

## 2020-01-20 NOTE — Patient Instructions (Signed)
[facility] Discharge Instructions for Patients Receiving Chemotherapy  Today you received the following chemotherapy agents: azacitidine.  To help prevent nausea and vomiting after your treatment, we encourage you to take your nausea medication as directed.   If you develop nausea and vomiting that is not controlled by your nausea medication, call the clinic.   BELOW ARE SYMPTOMS THAT SHOULD BE REPORTED IMMEDIATELY:  *FEVER GREATER THAN 100.5 F  *CHILLS WITH OR WITHOUT FEVER  NAUSEA AND VOMITING THAT IS NOT CONTROLLED WITH YOUR NAUSEA MEDICATION  *UNUSUAL SHORTNESS OF BREATH  *UNUSUAL BRUISING OR BLEEDING  TENDERNESS IN MOUTH AND THROAT WITH OR WITHOUT PRESENCE OF ULCERS  *URINARY PROBLEMS  *BOWEL PROBLEMS  UNUSUAL RASH Items with * indicate a potential emergency and should be followed up as soon as possible.  Feel free to call the clinic should you have any questions or concerns. The clinic phone number is [phone].  Please show the CHEMO ALERT CARD at check-in to the Emergency Department and triage nurse.   

## 2020-01-21 ENCOUNTER — Other Ambulatory Visit: Payer: Self-pay

## 2020-01-21 ENCOUNTER — Ambulatory Visit: Payer: No Typology Code available for payment source

## 2020-01-21 ENCOUNTER — Ambulatory Visit (INDEPENDENT_AMBULATORY_CARE_PROVIDER_SITE_OTHER): Payer: No Typology Code available for payment source | Admitting: *Deleted

## 2020-01-21 ENCOUNTER — Inpatient Hospital Stay: Payer: No Typology Code available for payment source

## 2020-01-21 VITALS — BP 105/62 | HR 71 | Temp 98.7°F | Resp 16

## 2020-01-21 DIAGNOSIS — Z5111 Encounter for antineoplastic chemotherapy: Secondary | ICD-10-CM | POA: Diagnosis not present

## 2020-01-21 DIAGNOSIS — M25511 Pain in right shoulder: Secondary | ICD-10-CM | POA: Diagnosis not present

## 2020-01-21 DIAGNOSIS — I63511 Cerebral infarction due to unspecified occlusion or stenosis of right middle cerebral artery: Secondary | ICD-10-CM

## 2020-01-21 DIAGNOSIS — X58XXXA Exposure to other specified factors, initial encounter: Secondary | ICD-10-CM | POA: Diagnosis not present

## 2020-01-21 DIAGNOSIS — L930 Discoid lupus erythematosus: Secondary | ICD-10-CM | POA: Diagnosis not present

## 2020-01-21 DIAGNOSIS — D469 Myelodysplastic syndrome, unspecified: Secondary | ICD-10-CM

## 2020-01-21 DIAGNOSIS — S4991XA Unspecified injury of right shoulder and upper arm, initial encounter: Secondary | ICD-10-CM | POA: Diagnosis not present

## 2020-01-21 LAB — CUP PACEART REMOTE DEVICE CHECK
Date Time Interrogation Session: 20210415123230
Implantable Pulse Generator Implant Date: 20200114

## 2020-01-21 MED ORDER — HEPARIN SOD (PORK) LOCK FLUSH 100 UNIT/ML IV SOLN
500.0000 [IU] | Freq: Once | INTRAVENOUS | Status: AC | PRN
  Administered 2020-01-21: 500 [IU]
  Filled 2020-01-21: qty 5

## 2020-01-21 MED ORDER — SODIUM CHLORIDE 0.9 % IV SOLN
75.0000 mg/m2 | Freq: Once | INTRAVENOUS | Status: AC
  Administered 2020-01-21: 135 mg via INTRAVENOUS
  Filled 2020-01-21: qty 13.5

## 2020-01-21 MED ORDER — SODIUM CHLORIDE 0.9% FLUSH
10.0000 mL | INTRAVENOUS | Status: DC | PRN
  Administered 2020-01-21: 10 mL
  Filled 2020-01-21: qty 10

## 2020-01-21 MED ORDER — SODIUM CHLORIDE 0.9 % IV SOLN
10.0000 mg | Freq: Once | INTRAVENOUS | Status: AC
  Administered 2020-01-21: 10 mg via INTRAVENOUS
  Filled 2020-01-21: qty 10

## 2020-01-21 MED ORDER — SODIUM CHLORIDE 0.9 % IV SOLN
Freq: Once | INTRAVENOUS | Status: AC
  Filled 2020-01-21: qty 250

## 2020-01-21 NOTE — Patient Instructions (Signed)
[facility] Discharge Instructions for Patients Receiving Chemotherapy  Today you received the following chemotherapy agents: azacitidine.  To help prevent nausea and vomiting after your treatment, we encourage you to take your nausea medication as directed.   If you develop nausea and vomiting that is not controlled by your nausea medication, call the clinic.   BELOW ARE SYMPTOMS THAT SHOULD BE REPORTED IMMEDIATELY:  *FEVER GREATER THAN 100.5 F  *CHILLS WITH OR WITHOUT FEVER  NAUSEA AND VOMITING THAT IS NOT CONTROLLED WITH YOUR NAUSEA MEDICATION  *UNUSUAL SHORTNESS OF BREATH  *UNUSUAL BRUISING OR BLEEDING  TENDERNESS IN MOUTH AND THROAT WITH OR WITHOUT PRESENCE OF ULCERS  *URINARY PROBLEMS  *BOWEL PROBLEMS  UNUSUAL RASH Items with * indicate a potential emergency and should be followed up as soon as possible.  Feel free to call the clinic should you have any questions or concerns. The clinic phone number is [phone].  Please show the CHEMO ALERT CARD at check-in to the Emergency Department and triage nurse.   

## 2020-01-22 ENCOUNTER — Ambulatory Visit: Payer: No Typology Code available for payment source

## 2020-01-22 ENCOUNTER — Inpatient Hospital Stay: Payer: No Typology Code available for payment source

## 2020-01-22 VITALS — BP 108/69 | HR 68 | Temp 98.3°F | Resp 14

## 2020-01-22 DIAGNOSIS — L930 Discoid lupus erythematosus: Secondary | ICD-10-CM | POA: Diagnosis not present

## 2020-01-22 DIAGNOSIS — D469 Myelodysplastic syndrome, unspecified: Secondary | ICD-10-CM

## 2020-01-22 DIAGNOSIS — Z5111 Encounter for antineoplastic chemotherapy: Secondary | ICD-10-CM | POA: Diagnosis not present

## 2020-01-22 MED ORDER — HEPARIN SOD (PORK) LOCK FLUSH 100 UNIT/ML IV SOLN
500.0000 [IU] | Freq: Once | INTRAVENOUS | Status: AC | PRN
  Administered 2020-01-22: 500 [IU]
  Filled 2020-01-22: qty 5

## 2020-01-22 MED ORDER — PALONOSETRON HCL INJECTION 0.25 MG/5ML
INTRAVENOUS | Status: AC
  Filled 2020-01-22: qty 5

## 2020-01-22 MED ORDER — SODIUM CHLORIDE 0.9 % IV SOLN
75.0000 mg/m2 | Freq: Once | INTRAVENOUS | Status: AC
  Administered 2020-01-22: 14:00:00 135 mg via INTRAVENOUS
  Filled 2020-01-22: qty 13.5

## 2020-01-22 MED ORDER — SODIUM CHLORIDE 0.9 % IV SOLN
10.0000 mg | Freq: Once | INTRAVENOUS | Status: AC
  Administered 2020-01-22: 10 mg via INTRAVENOUS
  Filled 2020-01-22: qty 10

## 2020-01-22 MED ORDER — SODIUM CHLORIDE 0.9% FLUSH
10.0000 mL | INTRAVENOUS | Status: DC | PRN
  Administered 2020-01-22: 10 mL
  Filled 2020-01-22: qty 10

## 2020-01-22 MED ORDER — SODIUM CHLORIDE 0.9 % IV SOLN
Freq: Once | INTRAVENOUS | Status: AC
  Filled 2020-01-22: qty 250

## 2020-01-22 MED ORDER — PALONOSETRON HCL INJECTION 0.25 MG/5ML
0.2500 mg | Freq: Once | INTRAVENOUS | Status: AC
  Administered 2020-01-22: 0.25 mg via INTRAVENOUS

## 2020-01-22 NOTE — Progress Notes (Signed)
ILR Remote 

## 2020-01-22 NOTE — Patient Instructions (Signed)
[facility] Discharge Instructions for Patients Receiving Chemotherapy  Today you received the following chemotherapy agents: azacitidine.  To help prevent nausea and vomiting after your treatment, we encourage you to take your nausea medication as directed.   If you develop nausea and vomiting that is not controlled by your nausea medication, call the clinic.   BELOW ARE SYMPTOMS THAT SHOULD BE REPORTED IMMEDIATELY:  *FEVER GREATER THAN 100.5 F  *CHILLS WITH OR WITHOUT FEVER  NAUSEA AND VOMITING THAT IS NOT CONTROLLED WITH YOUR NAUSEA MEDICATION  *UNUSUAL SHORTNESS OF BREATH  *UNUSUAL BRUISING OR BLEEDING  TENDERNESS IN MOUTH AND THROAT WITH OR WITHOUT PRESENCE OF ULCERS  *URINARY PROBLEMS  *BOWEL PROBLEMS  UNUSUAL RASH Items with * indicate a potential emergency and should be followed up as soon as possible.  Feel free to call the clinic should you have any questions or concerns. The clinic phone number is [phone].  Please show the CHEMO ALERT CARD at check-in to the Emergency Department and triage nurse.   

## 2020-01-25 ENCOUNTER — Other Ambulatory Visit: Payer: Self-pay | Admitting: Oncology

## 2020-01-25 ENCOUNTER — Other Ambulatory Visit: Payer: No Typology Code available for payment source

## 2020-01-25 ENCOUNTER — Telehealth: Payer: Self-pay | Admitting: *Deleted

## 2020-01-25 ENCOUNTER — Inpatient Hospital Stay: Payer: No Typology Code available for payment source

## 2020-01-25 ENCOUNTER — Ambulatory Visit: Payer: No Typology Code available for payment source

## 2020-01-25 ENCOUNTER — Inpatient Hospital Stay (HOSPITAL_BASED_OUTPATIENT_CLINIC_OR_DEPARTMENT_OTHER): Payer: No Typology Code available for payment source | Admitting: Medical

## 2020-01-25 ENCOUNTER — Other Ambulatory Visit: Payer: Self-pay

## 2020-01-25 VITALS — BP 96/58 | HR 81 | Temp 98.2°F | Resp 16

## 2020-01-25 DIAGNOSIS — D469 Myelodysplastic syndrome, unspecified: Secondary | ICD-10-CM

## 2020-01-25 DIAGNOSIS — E876 Hypokalemia: Secondary | ICD-10-CM | POA: Diagnosis not present

## 2020-01-25 DIAGNOSIS — Z95828 Presence of other vascular implants and grafts: Secondary | ICD-10-CM

## 2020-01-25 DIAGNOSIS — E860 Dehydration: Secondary | ICD-10-CM

## 2020-01-25 DIAGNOSIS — Z5111 Encounter for antineoplastic chemotherapy: Secondary | ICD-10-CM | POA: Diagnosis not present

## 2020-01-25 DIAGNOSIS — D649 Anemia, unspecified: Secondary | ICD-10-CM

## 2020-01-25 LAB — CMP (CANCER CENTER ONLY)
ALT: 14 U/L (ref 0–44)
AST: 10 U/L — ABNORMAL LOW (ref 15–41)
Albumin: 3.7 g/dL (ref 3.5–5.0)
Alkaline Phosphatase: 46 U/L (ref 38–126)
Anion gap: 9 (ref 5–15)
BUN: 21 mg/dL (ref 8–23)
CO2: 29 mmol/L (ref 22–32)
Calcium: 9.5 mg/dL (ref 8.9–10.3)
Chloride: 100 mmol/L (ref 98–111)
Creatinine: 1.24 mg/dL — ABNORMAL HIGH (ref 0.44–1.00)
GFR, Est AFR Am: 54 mL/min — ABNORMAL LOW (ref >60)
GFR, Estimated: 47 mL/min — ABNORMAL LOW (ref >60)
Glucose, Bld: 91 mg/dL (ref 70–99)
Potassium: 2.8 mmol/L — CL (ref 3.5–5.1)
Sodium: 138 mmol/L (ref 135–145)
Total Bilirubin: 1.5 mg/dL — ABNORMAL HIGH (ref 0.3–1.2)
Total Protein: 7.3 g/dL (ref 6.5–8.1)

## 2020-01-25 LAB — CBC WITH DIFFERENTIAL (CANCER CENTER ONLY)
Abs Immature Granulocytes: 0.15 10*3/uL — ABNORMAL HIGH (ref 0.00–0.07)
Basophils Absolute: 0 10*3/uL (ref 0.0–0.1)
Basophils Relative: 0 %
Eosinophils Absolute: 0 10*3/uL (ref 0.0–0.5)
Eosinophils Relative: 2 %
HCT: 26.1 % — ABNORMAL LOW (ref 36.0–46.0)
Hemoglobin: 8.9 g/dL — ABNORMAL LOW (ref 12.0–15.0)
Immature Granulocytes: 6 %
Lymphocytes Relative: 29 %
Lymphs Abs: 0.8 10*3/uL (ref 0.7–4.0)
MCH: 32.1 pg (ref 26.0–34.0)
MCHC: 34.1 g/dL (ref 30.0–36.0)
MCV: 94.2 fL (ref 80.0–100.0)
Monocytes Absolute: 0.3 10*3/uL (ref 0.1–1.0)
Monocytes Relative: 10 %
Neutro Abs: 1.5 10*3/uL — ABNORMAL LOW (ref 1.7–7.7)
Neutrophils Relative %: 53 %
Platelet Count: 238 10*3/uL (ref 150–400)
RBC: 2.77 MIL/uL — ABNORMAL LOW (ref 3.87–5.11)
RDW: 15.6 % — ABNORMAL HIGH (ref 11.5–15.5)
WBC Count: 2.8 10*3/uL — ABNORMAL LOW (ref 4.0–10.5)
nRBC: 0 % (ref 0.0–0.2)

## 2020-01-25 LAB — SAMPLE TO BLOOD BANK

## 2020-01-25 LAB — PHOSPHORUS: Phosphorus: 3.4 mg/dL (ref 2.5–4.6)

## 2020-01-25 LAB — LACTATE DEHYDROGENASE: LDH: 158 U/L (ref 98–192)

## 2020-01-25 LAB — URIC ACID: Uric Acid, Serum: <2 mg/dL — ABNORMAL LOW (ref 2.5–7.1)

## 2020-01-25 MED ORDER — SODIUM CHLORIDE 0.9% FLUSH
10.0000 mL | INTRAVENOUS | Status: DC | PRN
  Administered 2020-01-25: 10 mL
  Filled 2020-01-25: qty 10

## 2020-01-25 MED ORDER — SODIUM CHLORIDE 0.9 % IV SOLN
75.0000 mg/m2 | Freq: Once | INTRAVENOUS | Status: AC
  Administered 2020-01-25: 135 mg via INTRAVENOUS
  Filled 2020-01-25: qty 13.5

## 2020-01-25 MED ORDER — POTASSIUM CHLORIDE CRYS ER 20 MEQ PO TBCR
40.0000 meq | EXTENDED_RELEASE_TABLET | Freq: Once | ORAL | Status: AC
  Administered 2020-01-25: 40 meq via ORAL

## 2020-01-25 MED ORDER — PALONOSETRON HCL INJECTION 0.25 MG/5ML
INTRAVENOUS | Status: AC
  Filled 2020-01-25: qty 5

## 2020-01-25 MED ORDER — POTASSIUM CHLORIDE CRYS ER 20 MEQ PO TBCR
EXTENDED_RELEASE_TABLET | ORAL | Status: AC
  Filled 2020-01-25: qty 2

## 2020-01-25 MED ORDER — PALONOSETRON HCL INJECTION 0.25 MG/5ML
0.2500 mg | Freq: Once | INTRAVENOUS | Status: AC
  Administered 2020-01-25: 0.25 mg via INTRAVENOUS

## 2020-01-25 MED ORDER — SODIUM CHLORIDE 0.9 % IV SOLN
Freq: Once | INTRAVENOUS | Status: DC
  Filled 2020-01-25: qty 250

## 2020-01-25 MED ORDER — SODIUM CHLORIDE 0.9 % IV SOLN
INTRAVENOUS | Status: DC
  Filled 2020-01-25 (×2): qty 250

## 2020-01-25 MED ORDER — SODIUM CHLORIDE 0.9% FLUSH
10.0000 mL | INTRAVENOUS | Status: DC | PRN
  Administered 2020-01-25: 10 mL via INTRAVENOUS
  Filled 2020-01-25: qty 10

## 2020-01-25 MED ORDER — SODIUM CHLORIDE 0.9 % IV SOLN
Freq: Once | INTRAVENOUS | Status: AC
  Filled 2020-01-25: qty 250

## 2020-01-25 MED ORDER — HEPARIN SOD (PORK) LOCK FLUSH 100 UNIT/ML IV SOLN
500.0000 [IU] | Freq: Once | INTRAVENOUS | Status: AC | PRN
  Administered 2020-01-25: 15:00:00 500 [IU]
  Filled 2020-01-25: qty 5

## 2020-01-25 MED ORDER — SODIUM CHLORIDE 0.9 % IV SOLN
10.0000 mg | Freq: Once | INTRAVENOUS | Status: AC
  Administered 2020-01-25: 14:00:00 10 mg via INTRAVENOUS
  Filled 2020-01-25: qty 10

## 2020-01-25 MED ORDER — POTASSIUM CHLORIDE CRYS ER 20 MEQ PO TBCR: 40.0000 meq | EXTENDED_RELEASE_TABLET | Freq: Two times a day (BID) | ORAL | 0 refills | Status: DC

## 2020-01-25 NOTE — Progress Notes (Signed)
Received an order from Dr. Alen Blew to treat today despite alb results. Upon assessment patient stated that she has been dizzy, has light sensitivity, and had a fall on Sat. Her K+ was 2.8. Assessed by Sandi Mealy PA and order received for IVF bolus and K+ 2- MeQ and he sent a new prescription for patient to pick up and initiate at home.

## 2020-01-25 NOTE — Telephone Encounter (Signed)
Pt potassium 2.8. Breanna Perry notified

## 2020-01-25 NOTE — Progress Notes (Signed)
OK to treat. Breanna Perry, MHS, PA-C   Symptoms Management Clinic Progress Note   Breanna Perry [id number] 1956/04/05 64 y.o.  [name] is managed by Dr. Zola Button  Actively treated with chemotherapy/immunotherapy/hormonal therapy: yes  Current therapy: Vidaza  Last treated: 01/22/2020 (cycle 1, day 5)  Next scheduled appointment with provider: 02/15/2020  Assessment: Plan:    Dehydration - Plan: 0.9 %  sodium chloride infusion, CMP ([place] only)  Hypokalemia - Plan: potassium chloride SA (KLOR-CON) CR tablet 40 mEq, CMP ([place] only)  Hyperbilirubinemia - Plan: CMP ([place] only)  MDS (myelodysplastic syndrome) ([place])   Dehydration: The patient was given 1 L of normal saline IV today.  Hypokalemia: A chemistry panel returned today with a potassium of 2.8.  The patient was given with potassium chloride 20 mEq p.o. x1 today and was given a refill of potassium chloride 40 mEq p.o. twice daily.  A chemistry panel will be collected tomorrow.  Hyperbilirubinemia: Bilirubin returned elevated at 1.5.  This could likely be secondary to dehydration.  A chemistry panel will be collected tomorrow.  MDS: The patient will receive cycle 1, day 8 of 5 days today.  She will return to the clinic tomorrow for cycle 1, day 9 of therapy.  She is scheduled to be seen in follow-up by Dr. Alen Blew on oh 02/15/2020.  Please see After Visit Summary for patient specific instructions.  Future Appointments  Date Time Provider [place]  02/15/2020  8:00 AM [facility] LAB 4 [facility] None  02/15/2020  8:15 AM [facility] [place] FLUSH [facility] None  02/15/2020  8:30 AM Wyatt Portela, MD [facility] None  02/15/2020  9:30 AM [facility] INFUSION [facility] None  02/16/2020  9:30 AM [facility] INFUSION [facility] None  02/17/2020  9:30 AM [facility] INFUSION [facility] None  02/18/2020  9:30 AM [facility] INFUSION [facility] None  02/19/2020  9:30 AM [facility]  INFUSION [facility] None  02/22/2020  8:50 AM [facility] DEVICE REMOTES [facility]  02/22/2020 12:15 PM [facility] LAB ONLY [facility] None  02/22/2020 12:30 PM [facility] [place] FLUSH [facility] None  02/22/2020  1:30 PM [facility] INFUSION [facility] None  02/23/2020  2:00 PM [facility] INFUSION [facility] None  03/28/2020  9:25 AM [facility] DEVICE REMOTES [facility]  05/02/2020  9:25 AM [facility] DEVICE REMOTES [facility]  06/06/2020  9:25 AM [facility] DEVICE REMOTES [facility]  07/11/2020  9:25 AM [facility] DEVICE REMOTES [facility]  08/15/2020  9:25 AM [facility] DEVICE REMOTES [facility]  09/07/2020  8:00 AM Edgardo Roys, PsyD [facility] CPR  09/19/2020  9:25 AM [facility] DEVICE REMOTES [facility]  10/24/2020  9:25 AM [facility] DEVICE REMOTES [facility]    Orders Placed This Encounter  Procedures  . CMP ([place] only)       Subjective:   Patient ID:  Breanna Perry is a 64 y.o. (DOB 1956/04/16) female.  Chief Complaint: No chief complaint on file.   HPI Breanna Perry  is a 64 y.o. female with a diagnosis of MDS.  She is managed by Dr. Alen Blew and is currently treated with 5 days ago.  She was seen in the infusion room today as she presents for cycle 1, day 8 of right basilar.  She reports that she is fatigued, weak, tired, nauseated, anorexic, and has had light sensitivity for some time.  She felt at home on Saturday but does not recall the mechanics of her fall.  She did not hit her head or have a loss of consciousness.  Medications:  I have reviewed the patient's current medications.  Allergies: No Known Allergies  Past Medical History:  Diagnosis Date  . Anemia   . Anxiety   . Dental bridge present    upper and lower  . Depression   . Hypertension    under control with med., has been on med. x 8 yr.  . Leukocytopenia   . Medial meniscus  tear 12/2012   left  . Sjogren's syndrome ([place])   . Stroke ([place])   . Wears glasses    reading    Past Surgical History:  Procedure Laterality Date  . APPENDECTOMY  04/1991  . BIOPSY  11/20/2019   Procedure: BIOPSY;  Surgeon: Ronnette Juniper, MD;  Location: [facility];  Service: Gastroenterology;;  . COLONOSCOPY    . COLONOSCOPY WITH PROPOFOL N/A 11/20/2019   Procedure: COLONOSCOPY WITH PROPOFOL;  Surgeon: Ronnette Juniper, MD;  Location: [facility];  Service: Gastroenterology;  Laterality: N/A;  . ESOPHAGOGASTRODUODENOSCOPY (EGD) WITH PROPOFOL N/A 11/20/2019   Procedure: ESOPHAGOGASTRODUODENOSCOPY (EGD) WITH PROPOFOL;  Surgeon: Ronnette Juniper, MD;  Location: [facility];  Service: Gastroenterology;  Laterality: N/A;  . IR IMAGING GUIDED PORT INSERTION  01/19/2020  . KNEE ARTHROSCOPY Left 01/07/2013   Procedure: LEFT KNEE ARTHROSCOPY PARTIAL MEDIAL MENISECTOMY CHRONDOPLASTY MEDIAL PLICA RESECTION;  Surgeon: Alta Corning, MD;  Location: [place];  Service: Orthopedics;  Laterality: Left;  . KNEE SURGERY Left 06/22/2015  . LIVER BIOPSY  summer 2011  . LOOP RECORDER INSERTION N/A 10/21/2018   Procedure: LOOP RECORDER INSERTION;  Surgeon: Thompson Grayer, MD;  Location: [place] [facility];  Service: Cardiovascular;  Laterality: N/A;  . LUNG BIOPSY  summer 2011  . PARTIAL HYSTERECTOMY  04/1991   partial  . TEE WITHOUT CARDIOVERSION N/A 10/21/2018   Procedure: TRANSESOPHAGEAL ECHOCARDIOGRAM (TEE);  Surgeon: Elouise Munroe, MD;  Location: [place];  Service: Cardiology;  Laterality: N/A;  possible loop recorder    Family History  Problem Relation Age of Onset  . Hypertension Father   . Emphysema Father   . Heart attack Father   . Cirrhosis Father   . Coronary artery disease Mother   . Heart failure Mother   . COPD Mother   . Breast cancer Maternal Grandmother   . HIV Brother   . Diabetes Sister     Social History   Socioeconomic History  . Marital status: Single     Spouse name: Not on file  . Number of children: N  . Years of education: Not on file  . Highest education level: Not on file  Occupational History  . Occupation: parts specialist [employer]   Tobacco Use  . Smoking status: Former Smoker    Packs/day: 1.00    Years: 31.00    Pack years: 31.00    Types: Cigarettes    Quit date: 10/08/2000    Years since quitting: 19.3  . Smokeless tobacco: Never Used  Substance and Sexual Activity  . Alcohol use: Not Currently    Comment: rarely  . Drug use: No  . Sexual activity: Not on file  Other Topics Concern  . Not on file  Social History Narrative  . Not on file   Social Determinants of Health   Financial Resource Strain:   . Difficulty of Paying Living Expenses:   Food Insecurity:   . Worried About Charity fundraiser in the Last Year:   . Arboriculturist in the Last Year:   Transportation Needs:   .  Lack of Transportation (Medical):   Marland Kitchen Lack of Transportation (Non-Medical):   Physical Activity:   . Days of Exercise per Week:   . Minutes of Exercise per Session:   Stress:   . Feeling of Stress :   Social Connections:   . Frequency of Communication with Friends and Family:   . Frequency of Social Gatherings with Friends and Family:   . Attends Religious Services:   . Active Member of Clubs or Organizations:   . Attends Archivist Meetings:   Marland Kitchen Marital Status:   Intimate Partner Violence:   . Fear of Current or Ex-Partner:   . Emotionally Abused:   Marland Kitchen Physically Abused:   . Sexually Abused:     Past Medical History, Surgical history, Social history, and Family history were reviewed and updated as appropriate.   Please see review of systems for further details on the patient's review from today.   Review of Systems:  Review of Systems  Constitutional: Positive for appetite change and fatigue. Negative for chills, diaphoresis and fever.  HENT: Negative for dental problem, mouth sores and trouble swallowing.    Respiratory: Negative for cough, chest tightness and shortness of breath.   Cardiovascular: Negative for chest pain and palpitations.  Gastrointestinal: Positive for nausea. Negative for constipation, diarrhea and vomiting.  Neurological: Positive for dizziness and weakness. Negative for syncope and headaches.    Objective:   Physical Exam:  There were no vitals taken for this visit. ECOG: 1  Physical Exam Constitutional:      General: She is not in acute distress.    Appearance: She is not diaphoretic.  HENT:     Head: Normocephalic and atraumatic.  Eyes:     General: No scleral icterus.       Right eye: No discharge.        Left eye: No discharge.     Conjunctiva/sclera: Conjunctivae normal.  Cardiovascular:     Rate and Rhythm: Normal rate and regular rhythm.     Heart sounds: Normal heart sounds. No murmur. No friction rub. No gallop.   Pulmonary:     Effort: Pulmonary effort is normal. No respiratory distress.     Breath sounds: Normal breath sounds. No wheezing or rales.  Abdominal:     General: Abdomen is flat. Bowel sounds are normal. There is no distension.     Palpations: Abdomen is soft.     Tenderness: There is no abdominal tenderness. There is no guarding.  Skin:    General: Skin is warm and dry.     Findings: No erythema or rash.  Neurological:     Mental Status: She is alert.     Lab Review:     Component Value Date/Time   NA 139 01/26/2020 1305   NA 141 10/10/2018 1333   K 3.9 01/26/2020 1305   CL 106 01/26/2020 1305   CO2 25 01/26/2020 1305   GLUCOSE 100 (H) 01/26/2020 1305   BUN 20 01/26/2020 1305   BUN 12 10/10/2018 1333   CREATININE 1.07 (H) 01/26/2020 1305   CREATININE 1.01 03/29/2014 1428   CALCIUM 9.6 01/26/2020 1305   PROT 6.9 01/26/2020 1305   ALBUMIN 3.3 (L) 01/26/2020 1305   AST 7 (L) 01/26/2020 1305   ALT 9 01/26/2020 1305   ALKPHOS 37 (L) 01/26/2020 1305   BILITOT 0.9 01/26/2020 1305   GFRNONAA 56 (L) 01/26/2020 1305   GFRAA  >60 01/26/2020 1305       Component Value Date/Time  WBC 2.8 (L) 01/25/2020 1103   WBC 1.7 (L) 01/19/2020 1020   RBC 2.77 (L) 01/25/2020 1103   HGB 8.9 (L) 01/25/2020 1103   HGB 11.6 10/10/2018 1333   HCT 26.1 (L) 01/25/2020 1103   HCT 34.0 10/10/2018 1333   PLT 238 01/25/2020 1103   PLT 389 10/10/2018 1333   MCV 94.2 01/25/2020 1103   MCV 94 10/10/2018 1333   MCH 32.1 01/25/2020 1103   MCHC 34.1 01/25/2020 1103   RDW 15.6 (H) 01/25/2020 1103   RDW 14.7 10/10/2018 1333   LYMPHSABS 0.8 01/25/2020 1103   MONOABS 0.3 01/25/2020 1103   EOSABS 0.0 01/25/2020 1103   BASOSABS 0.0 01/25/2020 1103   -------------------------------  Imaging from last 24 hours (if applicable):  Radiology interpretation: CUP PACEART REMOTE DEVICE CHECK  Result Date: 01/21/2020 [facility] summary report received. Battery status OK. Normal device function. No new symptom episodes, tachy episodes, brady, or pause episodes. No new AF episodes. Monthly summary reports and ROV/PRN Kathy Breach, RN, CCDS, [employer]  IR IMAGING GUIDED PORT INSERTION  Result Date: 01/19/2020 INDICATION: 64 year old female with a history of myelodysplastic syndrome EXAM: IMAGE GUIDED PORT CATHETER PLACEMENT MEDICATIONS: 2 g Ancef; The antibiotic was administered within an appropriate time interval prior to skin puncture. ANESTHESIA/SEDATION: Moderate (conscious) sedation was employed during this procedure. A total of Versed 2.0 mg and Fentanyl 100 mcg was administered intravenously. Moderate Sedation Time: 18 minutes. The patient's level of consciousness and vital signs were monitored continuously by radiology nursing throughout the procedure under my direct supervision. FLUOROSCOPY TIME:  Fluoroscopy Time: 0 minutes 6 seconds (1 mGy). COMPLICATIONS: None PROCEDURE: The procedure, risks, benefits, and alternatives were explained to the patient. Questions regarding the procedure were encouraged and answered. The patient  understands and consents to the procedure. Ultrasound survey was performed with images stored and sent to PACs. The right neck and chest was prepped with chlorhexidine, and draped in the usual sterile fashion using maximum barrier technique (cap and mask, sterile gown, sterile gloves, large sterile sheet, hand hygiene and cutaneous antiseptic). Antibiotic prophylaxis was provided with 2.0g Ancef administered IV one hour prior to skin incision. Local anesthesia was attained by infiltration with 1% lidocaine without epinephrine. Ultrasound demonstrated patency of the right internal jugular vein, and this was documented with an image. Under real-time ultrasound guidance, this vein was accessed with a 21 gauge micropuncture needle and image documentation was performed. A small dermatotomy was made at the access site with an 11 scalpel. A 0.018" wire was advanced into the SVC and used to estimate the length of the internal catheter. The access needle exchanged for a 69F micropuncture vascular sheath. The 0.018" wire was then removed and a 0.035" wire advanced into the IVC. An appropriate location for the subcutaneous reservoir was selected below the clavicle and an incision was made through the skin and underlying soft tissues. The subcutaneous tissues were then dissected using a combination of blunt and sharp surgical technique and a pocket was formed. A single lumen power injectable portacatheter was then tunneled through the subcutaneous tissues from the pocket to the dermatotomy and the port reservoir placed within the subcutaneous pocket. The venous access site was then serially dilated and a peel away vascular sheath placed over the wire. The wire was removed and the port catheter advanced into position under fluoroscopic guidance. The catheter tip is positioned in the cavoatrial junction. This was documented with a spot image. The portacatheter was then tested and found to  flush and aspirate well. The port was  flushed with saline followed by 100 units/mL heparinized saline. The pocket was then closed in two layers using first subdermal inverted interrupted absorbable sutures followed by a running subcuticular suture. The epidermis was then sealed with Dermabond. The dermatotomy at the venous access site was also seal with Dermabond. Patient tolerated the procedure well and remained hemodynamically stable throughout. No complications encountered and no significant blood loss encountered IMPRESSION: Status post right IJ port catheter placement. Catheter ready for use. Signed, Dulcy Fanny. Dellia Nims, RPVI [facility] Radiology Electronically Signed   By: Corrie Mckusick D.O.   On: 01/19/2020 14:05

## 2020-01-25 NOTE — Patient Instructions (Signed)

## 2020-01-25 NOTE — Progress Notes (Signed)
Patient stated she was experiencing dizziness x 2 days. Some nausea and eye sensitivity. Notified Tammi, RN and will inform Genia Hotter as well to see if Sandi Mealy, PA can see her. Left patient accessed with overnight dressing in the event she gets treatment for the next two days.

## 2020-01-25 NOTE — Patient Instructions (Signed)
[facility] Discharge Instructions for Patients Receiving Chemotherapy  Today you received the following chemotherapy agents Vidaza  To help prevent nausea and vomiting after your treatment, we encourage you to take your nausea medication as directed  If you develop nausea and vomiting that is not controlled by your nausea medication, call the clinic.   BELOW ARE SYMPTOMS THAT SHOULD BE REPORTED IMMEDIATELY:  *FEVER GREATER THAN 100.5 F  *CHILLS WITH OR WITHOUT FEVER  NAUSEA AND VOMITING THAT IS NOT CONTROLLED WITH YOUR NAUSEA MEDICATION  *UNUSUAL SHORTNESS OF BREATH  *UNUSUAL BRUISING OR BLEEDING  TENDERNESS IN MOUTH AND THROAT WITH OR WITHOUT PRESENCE OF ULCERS  *URINARY PROBLEMS  *BOWEL PROBLEMS  UNUSUAL RASH Items with * indicate a potential emergency and should be followed up as soon as possible.  Feel free to call the clinic should you have any questions or concerns. The clinic phone number is [phone].  Please show the CHEMO ALERT CARD at check-in to the Emergency Department and triage nurse.   

## 2020-01-26 ENCOUNTER — Other Ambulatory Visit: Payer: Self-pay

## 2020-01-26 ENCOUNTER — Inpatient Hospital Stay: Payer: No Typology Code available for payment source

## 2020-01-26 ENCOUNTER — Ambulatory Visit: Payer: No Typology Code available for payment source

## 2020-01-26 ENCOUNTER — Inpatient Hospital Stay: Payer: No Typology Code available for payment source | Admitting: Medical

## 2020-01-26 ENCOUNTER — Ambulatory Visit (HOSPITAL_BASED_OUTPATIENT_CLINIC_OR_DEPARTMENT_OTHER): Payer: No Typology Code available for payment source | Admitting: Medical

## 2020-01-26 VITALS — BP 86/55 | HR 75 | Temp 98.9°F | Resp 18

## 2020-01-26 DIAGNOSIS — D469 Myelodysplastic syndrome, unspecified: Secondary | ICD-10-CM

## 2020-01-26 DIAGNOSIS — Z5111 Encounter for antineoplastic chemotherapy: Secondary | ICD-10-CM | POA: Diagnosis not present

## 2020-01-26 DIAGNOSIS — E876 Hypokalemia: Secondary | ICD-10-CM

## 2020-01-26 DIAGNOSIS — E860 Dehydration: Secondary | ICD-10-CM

## 2020-01-26 LAB — CMP (CANCER CENTER ONLY)
ALT: 9 U/L (ref 0–44)
AST: 7 U/L — ABNORMAL LOW (ref 15–41)
Albumin: 3.3 g/dL — ABNORMAL LOW (ref 3.5–5.0)
Alkaline Phosphatase: 37 U/L — ABNORMAL LOW (ref 38–126)
Anion gap: 8 (ref 5–15)
BUN: 20 mg/dL (ref 8–23)
CO2: 25 mmol/L (ref 22–32)
Calcium: 9.6 mg/dL (ref 8.9–10.3)
Chloride: 106 mmol/L (ref 98–111)
Creatinine: 1.07 mg/dL — ABNORMAL HIGH (ref 0.44–1.00)
GFR, Est AFR Am: >60 mL/min
GFR, Estimated: 56 mL/min — ABNORMAL LOW
Glucose, Bld: 100 mg/dL — ABNORMAL HIGH (ref 70–99)
Potassium: 3.9 mmol/L (ref 3.5–5.1)
Sodium: 139 mmol/L (ref 135–145)
Total Bilirubin: 0.9 mg/dL (ref 0.3–1.2)
Total Protein: 6.9 g/dL (ref 6.5–8.1)

## 2020-01-26 MED ORDER — SODIUM CHLORIDE 0.9 % IV SOLN
10.0000 mg | Freq: Once | INTRAVENOUS | Status: AC
  Administered 2020-01-26: 10 mg via INTRAVENOUS
  Filled 2020-01-26: qty 10

## 2020-01-26 MED ORDER — SODIUM CHLORIDE 0.9% FLUSH
10.0000 mL | INTRAVENOUS | Status: DC | PRN
  Administered 2020-01-26: 10 mL
  Filled 2020-01-26: qty 10

## 2020-01-26 MED ORDER — PALONOSETRON HCL INJECTION 0.25 MG/5ML
INTRAVENOUS | Status: AC
  Filled 2020-01-26: qty 5

## 2020-01-26 MED ORDER — PALONOSETRON HCL INJECTION 0.25 MG/5ML: 0.2500 mg | Freq: Once | INTRAVENOUS | Status: DC

## 2020-01-26 MED ORDER — SODIUM CHLORIDE 0.9 % IV SOLN
Freq: Once | INTRAVENOUS | Status: AC
  Filled 2020-01-26: qty 250

## 2020-01-26 MED ORDER — HEPARIN SOD (PORK) LOCK FLUSH 100 UNIT/ML IV SOLN
500.0000 [IU] | Freq: Once | INTRAVENOUS | Status: AC | PRN
  Administered 2020-01-26: 500 [IU]
  Filled 2020-01-26: qty 5

## 2020-01-26 MED ORDER — SODIUM CHLORIDE 0.9 % IV SOLN
75.0000 mg/m2 | Freq: Once | INTRAVENOUS | Status: AC
  Administered 2020-01-26: 15:00:00 135 mg via INTRAVENOUS
  Filled 2020-01-26: qty 13.5

## 2020-01-26 NOTE — Patient Instructions (Signed)
Constipation Management  Magnesium Citrate, drink 1/2 bottle, drink remainder if no bowel movement with 30 to 60 minutes  Or  30 mg (1 tablespoon) of Milk of Magnesia in 8 ounces of prune juice, warm in microwave for 20 seconds    Begin the following after you have had a bowel movement:  Senna-S, 1 to 2 tablets twice daily  MiraLAX 17 grams in 8 ounces of liquids 1 to 2 times daily as needed   Remember to remain well hydrated. Drink, Drink, Drink non-caffeinated beverages.   Adjust these medications based on your response. If your bowel movements become too loose then decrease the amount of Senna-S and/or MiraLAX that you are using. If your bowel movements become too firm or are difficult to pass, the increase the amount of Senna-S and/or MiraLAX that you are using and increase your intake of water.   NEVER, NEVER, NEVER use an enema or suppositories unless your provider has given their approval.                   

## 2020-01-26 NOTE — Patient Instructions (Signed)
[facility] Discharge Instructions for Patients Receiving Chemotherapy  Today you received the following chemotherapy agents Vidaza  To help prevent nausea and vomiting after your treatment, we encourage you to take your nausea medication as directed  If you develop nausea and vomiting that is not controlled by your nausea medication, call the clinic.   BELOW ARE SYMPTOMS THAT SHOULD BE REPORTED IMMEDIATELY:  *FEVER GREATER THAN 100.5 F  *CHILLS WITH OR WITHOUT FEVER  NAUSEA AND VOMITING THAT IS NOT CONTROLLED WITH YOUR NAUSEA MEDICATION  *UNUSUAL SHORTNESS OF BREATH  *UNUSUAL BRUISING OR BLEEDING  TENDERNESS IN MOUTH AND THROAT WITH OR WITHOUT PRESENCE OF ULCERS  *URINARY PROBLEMS  *BOWEL PROBLEMS  UNUSUAL RASH Items with * indicate a potential emergency and should be followed up as soon as possible.  Feel free to call the clinic should you have any questions or concerns. The clinic phone number is [phone].  Please show the CHEMO ALERT CARD at check-in to the Emergency Department and triage nurse.   

## 2020-01-26 NOTE — Progress Notes (Signed)
These preliminary result these preliminary results were noted.  Awaiting final report.

## 2020-01-27 ENCOUNTER — Ambulatory Visit: Payer: No Typology Code available for payment source

## 2020-01-27 ENCOUNTER — Other Ambulatory Visit: Payer: Self-pay | Admitting: Oncology

## 2020-01-27 NOTE — Progress Notes (Signed)
The patient was seen and examined in room today after she told her nurse that she did not react.  Nothing was done about this yesterday and her exam.  She had laboratory collected today which returned with a Potassium 3.9 also it was noted that her bilirubin was within normal saline 0.9 today.  She was given information on management constipation.  Sandi Mealy, MHS, PA-C Physician Assistant

## 2020-02-05 ENCOUNTER — Inpatient Hospital Stay: Payer: No Typology Code available for payment source

## 2020-02-05 ENCOUNTER — Encounter: Payer: Self-pay | Admitting: Oncology

## 2020-02-05 ENCOUNTER — Telehealth: Payer: Self-pay

## 2020-02-05 ENCOUNTER — Other Ambulatory Visit: Payer: Self-pay

## 2020-02-05 ENCOUNTER — Other Ambulatory Visit: Payer: Self-pay | Admitting: Medical

## 2020-02-05 ENCOUNTER — Inpatient Hospital Stay (HOSPITAL_BASED_OUTPATIENT_CLINIC_OR_DEPARTMENT_OTHER): Payer: No Typology Code available for payment source | Admitting: Medical

## 2020-02-05 VITALS — BP 64/45 | HR 70 | Temp 98.0°F | Resp 17 | Ht 63.0 in | Wt 153.4 lb

## 2020-02-05 DIAGNOSIS — D649 Anemia, unspecified: Secondary | ICD-10-CM

## 2020-02-05 DIAGNOSIS — R531 Weakness: Secondary | ICD-10-CM

## 2020-02-05 DIAGNOSIS — D469 Myelodysplastic syndrome, unspecified: Secondary | ICD-10-CM

## 2020-02-05 DIAGNOSIS — Z5111 Encounter for antineoplastic chemotherapy: Secondary | ICD-10-CM | POA: Diagnosis not present

## 2020-02-05 LAB — CBC WITH DIFFERENTIAL (CANCER CENTER ONLY)
Abs Immature Granulocytes: 0.09 10*3/uL — ABNORMAL HIGH (ref 0.00–0.07)
Basophils Absolute: 0 10*3/uL (ref 0.0–0.1)
Basophils Relative: 1 %
Eosinophils Absolute: 0.1 10*3/uL (ref 0.0–0.5)
Eosinophils Relative: 5 %
HCT: 16.7 % — ABNORMAL LOW (ref 36.0–46.0)
Hemoglobin: 5.5 g/dL — CL (ref 12.0–15.0)
Immature Granulocytes: 7 %
Lymphocytes Relative: 43 %
Lymphs Abs: 0.6 10*3/uL — ABNORMAL LOW (ref 0.7–4.0)
MCH: 32.7 pg (ref 26.0–34.0)
MCHC: 32.9 g/dL (ref 30.0–36.0)
MCV: 99.4 fL (ref 80.0–100.0)
Monocytes Absolute: 0.1 10*3/uL (ref 0.1–1.0)
Monocytes Relative: 7 %
Neutro Abs: 0.5 10*3/uL — ABNORMAL LOW (ref 1.7–7.7)
Neutrophils Relative %: 37 %
Platelet Count: 88 10*3/uL — ABNORMAL LOW (ref 150–400)
RBC: 1.68 MIL/uL — ABNORMAL LOW (ref 3.87–5.11)
RDW: 16.1 % — ABNORMAL HIGH (ref 11.5–15.5)
WBC Count: 1.4 10*3/uL — ABNORMAL LOW (ref 4.0–10.5)
nRBC: 0 % (ref 0.0–0.2)

## 2020-02-05 LAB — CMP (CANCER CENTER ONLY)
ALT: 7 U/L (ref 0–44)
AST: 8 U/L — ABNORMAL LOW (ref 15–41)
Albumin: 3 g/dL — ABNORMAL LOW (ref 3.5–5.0)
Alkaline Phosphatase: 40 U/L (ref 38–126)
Anion gap: 9 (ref 5–15)
BUN: 16 mg/dL (ref 8–23)
CO2: 24 mmol/L (ref 22–32)
Calcium: 9.5 mg/dL (ref 8.9–10.3)
Chloride: 106 mmol/L (ref 98–111)
Creatinine: 1.83 mg/dL — ABNORMAL HIGH (ref 0.44–1.00)
GFR, Est AFR Am: 34 mL/min — ABNORMAL LOW (ref >60)
GFR, Estimated: 29 mL/min — ABNORMAL LOW (ref >60)
Glucose, Bld: 107 mg/dL — ABNORMAL HIGH (ref 70–99)
Potassium: 4.3 mmol/L (ref 3.5–5.1)
Sodium: 139 mmol/L (ref 135–145)
Total Bilirubin: 0.6 mg/dL (ref 0.3–1.2)
Total Protein: 7 g/dL (ref 6.5–8.1)

## 2020-02-05 LAB — SAMPLE TO BLOOD BANK

## 2020-02-05 LAB — MAGNESIUM: Magnesium: 2.3 mg/dL (ref 1.7–2.4)

## 2020-02-05 LAB — PREPARE RBC (CROSSMATCH)

## 2020-02-05 NOTE — Telephone Encounter (Signed)
Called patient and let her know she can be seen today in Symptom Management. Patient verbalized understanding and stated she will find someone to drive her to the appointment.

## 2020-02-05 NOTE — Telephone Encounter (Signed)
-----   Message from Wyatt Portela, MD sent at 02/05/2020 12:30 PM EDT ----- Faythe Ghee to be seen at symptom management.  She likely will need a blood transfusion tomorrow or Monday.  Thanks ----- Message ----- From: Tami Lin, RN Sent: 02/05/2020  12:15 PM EDT To: Wyatt Portela, MD  Patient sent this message via my chart. Do you want me to have her seen in Symptom Management? Breanna Perry  I'm having contant dizziness and strong heart palpatation whenever I go more than10 feet or so. Every few minutes I either have to sit down or lie. Should I come in or try and wait it out to Monday?   She said she has bumped into things but has not fallen. Denies nausea but said she has constant dizziness. [name]

## 2020-02-05 NOTE — Progress Notes (Signed)
Ms. is like to be seen in the clinic today after she calls stating that she was having constant dizziness, strong heart palpitations when walking more than 10 feet.  She reports that she has to sit or lie down every few minutes.  She also reports that she bumps into things but has not fallen.  She denies nausea or vomiting.  Labs were completed today with a CBC returning showing a WBC of 1.4, hemoglobin 5.5, hematocrit 16.7, platelet count 88, and ANC of 0.5.  Her chemistry panel was stable except for an elevation of her creatinine up to 1.83.  This was up from 1.07 when checked 10 days ago.  Initially it was hoped that the patient could receive a transfusion through the emergency room today however due to the totals in the emergency room that was not feasible.  She will return tomorrow.  2 units of packed red blood cells have been requested.  A CBC has been ordered between unit 1 unit to packed red blood cells if needed.  Her vital signs today showed a blood pressure of 69/49, pulse of 70, respirations 17, and according to her chart an oxygen saturation of 80%.  The patient was not in any acute distress.  She was told to contact 911 or present to the emergency room should her condition worsen overnight.  She expressed understanding and agreement with this plan.  Sandi Mealy, MHS, PA-C Physician Assistant

## 2020-02-06 ENCOUNTER — Other Ambulatory Visit: Payer: Self-pay

## 2020-02-06 ENCOUNTER — Inpatient Hospital Stay: Payer: No Typology Code available for payment source | Attending: Oncology

## 2020-02-06 DIAGNOSIS — D469 Myelodysplastic syndrome, unspecified: Secondary | ICD-10-CM | POA: Insufficient documentation

## 2020-02-06 DIAGNOSIS — Z79899 Other long term (current) drug therapy: Secondary | ICD-10-CM | POA: Diagnosis not present

## 2020-02-06 DIAGNOSIS — Z7982 Long term (current) use of aspirin: Secondary | ICD-10-CM | POA: Insufficient documentation

## 2020-02-06 DIAGNOSIS — K5900 Constipation, unspecified: Secondary | ICD-10-CM | POA: Diagnosis not present

## 2020-02-06 DIAGNOSIS — Z5111 Encounter for antineoplastic chemotherapy: Secondary | ICD-10-CM | POA: Insufficient documentation

## 2020-02-06 DIAGNOSIS — D649 Anemia, unspecified: Secondary | ICD-10-CM

## 2020-02-06 MED ORDER — HEPARIN SOD (PORK) LOCK FLUSH 100 UNIT/ML IV SOLN
500.0000 [IU] | Freq: Every day | INTRAVENOUS | Status: DC | PRN
  Filled 2020-02-06: qty 5

## 2020-02-06 MED ORDER — ACETAMINOPHEN 325 MG PO TABS
650.0000 mg | ORAL_TABLET | Freq: Once | ORAL | Status: AC
  Administered 2020-02-06: 650 mg via ORAL

## 2020-02-06 MED ORDER — DIPHENHYDRAMINE HCL 25 MG PO CAPS
25.0000 mg | ORAL_CAPSULE | Freq: Once | ORAL | Status: AC
  Administered 2020-02-06: 08:00:00 25 mg via ORAL

## 2020-02-06 MED ORDER — DIPHENHYDRAMINE HCL 25 MG PO CAPS
ORAL_CAPSULE | ORAL | Status: AC
  Filled 2020-02-06: qty 1

## 2020-02-06 MED ORDER — ACETAMINOPHEN 325 MG PO TABS
ORAL_TABLET | ORAL | Status: AC
  Filled 2020-02-06: qty 2

## 2020-02-06 MED ORDER — SODIUM CHLORIDE 0.9% IV SOLUTION
250.0000 mL | Freq: Once | INTRAVENOUS | Status: AC
  Administered 2020-02-06: 250 mL via INTRAVENOUS
  Filled 2020-02-06: qty 250

## 2020-02-06 NOTE — Patient Instructions (Signed)

## 2020-02-08 LAB — TYPE AND SCREEN
ABO/RH(D): O POS
Antibody Screen: NEGATIVE
Unit division: 0
Unit division: 0

## 2020-02-08 LAB — BPAM RBC
Blood Product Expiration Date: 202106032359
Blood Product Expiration Date: 202106042359
ISSUE DATE / TIME: 202105010750
ISSUE DATE / TIME: 202105010750
Unit Type and Rh: 5100
Unit Type and Rh: 5100

## 2020-02-09 NOTE — Progress Notes (Signed)
Pharmacist Chemotherapy Monitoring - Follow Up Assessment    I verify that I have reviewed each item in the below checklist:  . Regimen for the patient is scheduled for the appropriate day and plan matches scheduled date. Marland Kitchen Appropriate non-routine labs are ordered dependent on drug ordered. . If applicable, additional medications reviewed and ordered per protocol based on lifetime cumulative doses and/or treatment regimen.   Plan for follow-up and/or issues identified: No . I-vent associated with next due treatment: No . MD and/or nursing notified: No  Mekesha Solomon K 02/09/2020 11:33 AM

## 2020-02-10 NOTE — Addendum Note (Signed)
Addended by: Caryl Comes E on: 02/10/2020 10:44 AM   Modules accepted: Orders

## 2020-02-11 DIAGNOSIS — F322 Major depressive disorder, single episode, severe without psychotic features: Secondary | ICD-10-CM | POA: Diagnosis not present

## 2020-02-11 DIAGNOSIS — E782 Mixed hyperlipidemia: Secondary | ICD-10-CM | POA: Diagnosis not present

## 2020-02-11 DIAGNOSIS — Z0000 Encounter for general adult medical examination without abnormal findings: Secondary | ICD-10-CM | POA: Diagnosis not present

## 2020-02-11 DIAGNOSIS — D469 Myelodysplastic syndrome, unspecified: Secondary | ICD-10-CM | POA: Diagnosis not present

## 2020-02-11 DIAGNOSIS — I10 Essential (primary) hypertension: Secondary | ICD-10-CM | POA: Diagnosis not present

## 2020-02-12 NOTE — Progress Notes (Signed)
Pharmacist Chemotherapy Monitoring - Follow Up Assessment    I verify that I have reviewed each item in the below checklist:  . Regimen for the patient is scheduled for the appropriate day and plan matches scheduled date. Marland Kitchen Appropriate non-routine labs are ordered dependent on drug ordered. . If applicable, additional medications reviewed and ordered per protocol based on lifetime cumulative doses and/or treatment regimen.   Plan for follow-up and/or issues identified: No . I-vent associated with next due treatment: Yes . MD and/or nursing notified: No   Kennith Center, Pharm.D., CPP 02/12/2020@2 :24 PM

## 2020-02-15 ENCOUNTER — Ambulatory Visit: Payer: No Typology Code available for payment source

## 2020-02-15 ENCOUNTER — Inpatient Hospital Stay: Payer: No Typology Code available for payment source

## 2020-02-15 ENCOUNTER — Inpatient Hospital Stay (HOSPITAL_BASED_OUTPATIENT_CLINIC_OR_DEPARTMENT_OTHER): Payer: No Typology Code available for payment source | Admitting: Oncology

## 2020-02-15 ENCOUNTER — Other Ambulatory Visit: Payer: Self-pay

## 2020-02-15 VITALS — BP 105/62 | HR 83 | Temp 98.3°F | Resp 18 | Ht 63.0 in | Wt 155.6 lb

## 2020-02-15 DIAGNOSIS — D469 Myelodysplastic syndrome, unspecified: Secondary | ICD-10-CM

## 2020-02-15 DIAGNOSIS — I10 Essential (primary) hypertension: Secondary | ICD-10-CM | POA: Diagnosis not present

## 2020-02-15 DIAGNOSIS — E782 Mixed hyperlipidemia: Secondary | ICD-10-CM | POA: Diagnosis not present

## 2020-02-15 DIAGNOSIS — Z5111 Encounter for antineoplastic chemotherapy: Secondary | ICD-10-CM | POA: Diagnosis not present

## 2020-02-15 DIAGNOSIS — D649 Anemia, unspecified: Secondary | ICD-10-CM

## 2020-02-15 DIAGNOSIS — Z95828 Presence of other vascular implants and grafts: Secondary | ICD-10-CM

## 2020-02-15 LAB — CBC WITH DIFFERENTIAL (CANCER CENTER ONLY)
Abs Immature Granulocytes: 0.08 10*3/uL — ABNORMAL HIGH (ref 0.00–0.07)
Basophils Absolute: 0 10*3/uL (ref 0.0–0.1)
Basophils Relative: 1 %
Eosinophils Absolute: 0.2 10*3/uL (ref 0.0–0.5)
Eosinophils Relative: 5 %
HCT: 22.7 % — ABNORMAL LOW (ref 36.0–46.0)
Hemoglobin: 7.5 g/dL — ABNORMAL LOW (ref 12.0–15.0)
Immature Granulocytes: 3 %
Lymphocytes Relative: 29 %
Lymphs Abs: 0.9 10*3/uL (ref 0.7–4.0)
MCH: 31.9 pg (ref 26.0–34.0)
MCHC: 33 g/dL (ref 30.0–36.0)
MCV: 96.6 fL (ref 80.0–100.0)
Monocytes Absolute: 0.4 10*3/uL (ref 0.1–1.0)
Monocytes Relative: 13 %
Neutro Abs: 1.5 10*3/uL — ABNORMAL LOW (ref 1.7–7.7)
Neutrophils Relative %: 49 %
Platelet Count: 848 10*3/uL — ABNORMAL HIGH (ref 150–400)
RBC: 2.35 MIL/uL — ABNORMAL LOW (ref 3.87–5.11)
RDW: 15.4 % (ref 11.5–15.5)
WBC Count: 3.1 10*3/uL — ABNORMAL LOW (ref 4.0–10.5)
nRBC: 0.7 % — ABNORMAL HIGH (ref 0.0–0.2)

## 2020-02-15 LAB — LACTATE DEHYDROGENASE: LDH: 222 U/L — ABNORMAL HIGH (ref 98–192)

## 2020-02-15 LAB — CMP (CANCER CENTER ONLY)
ALT: <6 U/L (ref 0–44)
AST: 8 U/L — ABNORMAL LOW (ref 15–41)
Albumin: 2.7 g/dL — ABNORMAL LOW (ref 3.5–5.0)
Alkaline Phosphatase: 41 U/L (ref 38–126)
Anion gap: 8 (ref 5–15)
BUN: 6 mg/dL — ABNORMAL LOW (ref 8–23)
CO2: 24 mmol/L (ref 22–32)
Calcium: 9.1 mg/dL (ref 8.9–10.3)
Chloride: 107 mmol/L (ref 98–111)
Creatinine: 0.78 mg/dL (ref 0.44–1.00)
GFR, Est AFR Am: >60 mL/min (ref >60)
GFR, Estimated: >60 mL/min (ref >60)
Glucose, Bld: 93 mg/dL (ref 70–99)
Potassium: 3.6 mmol/L (ref 3.5–5.1)
Sodium: 139 mmol/L (ref 135–145)
Total Bilirubin: 0.5 mg/dL (ref 0.3–1.2)
Total Protein: 6.8 g/dL (ref 6.5–8.1)

## 2020-02-15 LAB — SAMPLE TO BLOOD BANK

## 2020-02-15 LAB — URIC ACID: Uric Acid, Serum: <2 mg/dL — ABNORMAL LOW (ref 2.5–7.1)

## 2020-02-15 LAB — PHOSPHORUS: Phosphorus: 3.2 mg/dL (ref 2.5–4.6)

## 2020-02-15 MED ORDER — SODIUM CHLORIDE 0.9 % IV SOLN
Freq: Once | INTRAVENOUS | Status: AC
  Filled 2020-02-15: qty 250

## 2020-02-15 MED ORDER — SODIUM CHLORIDE 0.9 % IV SOLN
10.0000 mg | Freq: Once | INTRAVENOUS | Status: AC
  Administered 2020-02-15: 10 mg via INTRAVENOUS
  Filled 2020-02-15: qty 10

## 2020-02-15 MED ORDER — SODIUM CHLORIDE 0.9% FLUSH
10.0000 mL | INTRAVENOUS | Status: DC | PRN
  Administered 2020-02-15: 10 mL via INTRAVENOUS
  Filled 2020-02-15: qty 10

## 2020-02-15 MED ORDER — PALONOSETRON HCL INJECTION 0.25 MG/5ML
INTRAVENOUS | Status: AC
  Filled 2020-02-15: qty 5

## 2020-02-15 MED ORDER — HEPARIN SOD (PORK) LOCK FLUSH 100 UNIT/ML IV SOLN
500.0000 [IU] | Freq: Once | INTRAVENOUS | Status: AC | PRN
  Administered 2020-02-15: 500 [IU]
  Filled 2020-02-15: qty 5

## 2020-02-15 MED ORDER — PALONOSETRON HCL INJECTION 0.25 MG/5ML
0.2500 mg | Freq: Once | INTRAVENOUS | Status: AC
  Administered 2020-02-15: 0.25 mg via INTRAVENOUS

## 2020-02-15 MED ORDER — SODIUM CHLORIDE 0.9 % IV SOLN
75.0000 mg/m2 | Freq: Once | INTRAVENOUS | Status: AC
  Administered 2020-02-15: 135 mg via INTRAVENOUS
  Filled 2020-02-15: qty 13.5

## 2020-02-15 MED ORDER — SODIUM CHLORIDE 0.9% FLUSH
10.0000 mL | INTRAVENOUS | Status: DC | PRN
  Administered 2020-02-15: 10 mL
  Filled 2020-02-15: qty 10

## 2020-02-15 NOTE — Progress Notes (Signed)
Hematology and Oncology Follow Up Visit  Breanna Perry [id number] 1956-05-19 64 y.o. 02/15/2020 8:53 AM Breanna Perry, MDSmith, [name], MD   Principle Diagnosis: 64 year old woman with MDS diagnosed in February 2021.  She was found to have high risk disease with IPSS-R score of 6 and 12 to 14% blasts, 5 q. minus deletion.    Current therapy:   Supportive packed red cell transfusion.  Vidaza 75 mg per metered square IV started on January 18, 2020.  She is receiving 7 consecutive days every 4 weeks.  She is here for cycle 2 of therapy.    Interim History: Breanna Perry returns today for a repeat evaluation.  Since the last visit, she completed the first cycle of Vidaza without any major complications.  She did report some fatigue and mild nausea but no vomiting.  She does report constipation and currently using stool softeners and laxatives including an magnesium citrate at times.  She denies any fevers, chills or night sweats.  She denies any recent hospitalization or illnesses.  Performance status and quality of life remains dramatically unchanged at this time.  She is eating less and of lost some weight but otherwise feeling reasonably fair.     Medications: updated on review.   Current Outpatient Medications  Medication Sig Dispense Refill  . allopurinol (ZYLOPRIM) 300 MG tablet TAKE 1 TABLET (300 MG TOTAL) BY MOUTH 2 (TWO) TIMES DAILY. 60 tablet 0  . ARIPiprazole (ABILIFY) 5 MG tablet 5 mg.    . aspirin EC 81 MG EC tablet Take 1 tablet (81 mg total) by mouth daily.    Marland Kitchen atorvastatin (LIPITOR) 40 MG tablet Take 1 tablet (40 mg total) by mouth daily at 6 PM. (Patient taking differently: Take 40 mg by mouth daily. ) 30 tablet 2  . betamethasone dipropionate 0.05 % cream Apply topically 2 (two) times daily. Recently started    . busPIRone (BUSPAR) 10 MG tablet Take 10 mg by mouth 2 (two) times daily.    . citalopram (CELEXA) 40 MG tablet Take 40 mg by mouth daily.   1  . colesevelam  (WELCHOL) 625 MG tablet Take 1,875 mg by mouth 2 (two) times daily.    . cyanocobalamin 1000 MCG tablet Take by mouth.    . EQ GENTLE LAXATIVE 5 MG EC tablet See admin instructions.    . Eszopiclone 3 MG TABS Take 3 mg by mouth at bedtime as needed (sleep).     . hydrocortisone 2.5 % cream Apply 1 application topically 2 (two) times daily. resently started    . lidocaine-prilocaine (EMLA) cream Apply 1 application topically as needed. 30 g 0  . metoprolol (TOPROL-XL) 50 MG 24 hr tablet Take 50 mg by mouth daily.     . Olmesartan-Amlodipine-HCTZ (TRIBENZOR) 40-10-25 MG TABS Take 1 tablet by mouth daily.     . pantoprazole (PROTONIX) 40 MG tablet Take 1 tablet (40 mg total) by mouth daily. 60 tablet 1  . polyethylene glycol-electrolytes (NULYTELY) 420 g solution See admin instructions.    . potassium chloride SA (KLOR-CON) 20 MEQ tablet Take 2 tablets (40 mEq total) by mouth 2 (two) times daily. 30 tablet 0  . prochlorperazine (COMPAZINE) 10 MG tablet Take 1 tablet (10 mg total) by mouth every 6 (six) hours as needed for nausea or vomiting. 30 tablet 0   No current facility-administered medications for this visit.   Facility-Administered Medications Ordered in Other Visits  Medication Dose Route Frequency Provider Last Rate Last Admin  . sodium  chloride flush (NS) 0.9 % injection 10 mL  10 mL Intravenous PRN Wyatt Portela, MD   10 mL at 02/15/20 0840     Allergies: No Known Allergies    Physical Exam:  Blood pressure 105/62, pulse 83, temperature 98.3 F (36.8 C), temperature source Temporal, resp. rate 18, height 5\' 3"  (1.6 m), weight 155 lb 9.6 oz (70.6 kg), SpO2 100 %.    ECOG: 1     General appearance: Comfortable appearing without any discomfort Head: Normocephalic without any trauma Oropharynx: Mucous membranes are moist and pink without any thrush or ulcers. Eyes: Pupils are equal and round reactive to light. Lymph nodes: No cervical, supraclavicular, inguinal or  axillary lymphadenopathy.   Heart:regular rate and rhythm.  S1 and S2 without leg edema. Lung: Clear without any rhonchi or wheezes.  No dullness to percussion. Abdomin: Soft, nontender, nondistended with good bowel sounds.  No hepatosplenomegaly. Musculoskeletal: No joint deformity or effusion.  Full range of motion noted. Neurological: No deficits noted on motor, sensory and deep tendon reflex exam. Skin: No petechial rash or dryness.  Appeared moist.        Lab Results: Lab Results  Component Value Date   WBC 1.4 (L) 02/05/2020   HGB 5.5 (LL) 02/05/2020   HCT 16.7 (L) 02/05/2020   MCV 99.4 02/05/2020   PLT 88 (L) 02/05/2020     Chemistry      Component Value Date/Time   NA 139 02/05/2020 1407   NA 141 10/10/2018 1333   K 4.3 02/05/2020 1407   CL 106 02/05/2020 1407   CO2 24 02/05/2020 1407   BUN 16 02/05/2020 1407   BUN 12 10/10/2018 1333   CREATININE 1.83 (H) 02/05/2020 1407   CREATININE 1.01 03/29/2014 1428      Component Value Date/Time   CALCIUM 9.5 02/05/2020 1407   ALKPHOS 40 02/05/2020 1407   AST 8 (L) 02/05/2020 1407   ALT 7 02/05/2020 1407   BILITOT 0.6 02/05/2020 1407       Impression and Plan:   64 year old woman with:  1.  High risk myelodysplastic syndrome diagnosed in February 2021.  She was found to have IPSS-R score is 6 with high blast count.  She is currently receiving Vidaza with salvage purposes and completed the first cycle of therapy.  Risks and benefits of proceeding with cycle 2 of disease treatment were discussed.  Different salvage options including stem cell transplant after achieving positive response to therapy.  She is agreeable to proceed at this time.  2.  Anemia: She continues to receive packed red cell transfusion as needed.  Hemoglobin is adequate at this time and will not require transfusion we will continue to monitor weekly.  3.  IV access: Port-A-Cath remains in place without any issues.  4.  Tumor lysis syndrome  prophylaxis: No evidence to suggest tumor lysis at this time.  5.  Prognosis: Her disease might not be curable without stem cell transplant but aggressive measures are warranted given her excellent performance status and age.  6.  Antiemetics: No nausea or vomiting reported.  Antiemetics are available to her.  7.  Follow-up: She will continue to follow daily to complete cycle 2 of therapy just anticipated to the end on May 18.  And she will start cycle 3 in 4 weeks.  30  minutes were dedicated to this visit.  Time was spent on updating her disease status, reviewing laboratory data addressing complication related to therapy and future plan of care  discussion.    Zola Button, MD 5/10/20218:53 AM

## 2020-02-15 NOTE — Progress Notes (Signed)
Okay to treat D1C2 Vidaza with Hgb 7.5 per Dr. Alen Blew.

## 2020-02-15 NOTE — Progress Notes (Signed)
Pharmacist Chemotherapy Monitoring - Follow Up Assessment    I verify that I have reviewed each item in the below checklist:  . Regimen for the patient is scheduled for the appropriate day and plan matches scheduled date. Marland Kitchen Appropriate non-routine labs are ordered dependent on drug ordered. . If applicable, additional medications reviewed and ordered per protocol based on lifetime cumulative doses and/or treatment regimen.   Plan for follow-up and/or issues identified: No . I-vent associated with next due treatment: No . MD and/or nursing notified: No  Twanna Resh D 02/15/2020 3:20 PM

## 2020-02-16 ENCOUNTER — Telehealth: Payer: Self-pay | Admitting: Oncology

## 2020-02-16 ENCOUNTER — Ambulatory Visit: Payer: No Typology Code available for payment source

## 2020-02-16 ENCOUNTER — Inpatient Hospital Stay: Payer: No Typology Code available for payment source

## 2020-02-16 ENCOUNTER — Other Ambulatory Visit: Payer: Self-pay

## 2020-02-16 VITALS — BP 97/57 | HR 91 | Temp 98.0°F | Resp 18

## 2020-02-16 DIAGNOSIS — D469 Myelodysplastic syndrome, unspecified: Secondary | ICD-10-CM

## 2020-02-16 DIAGNOSIS — Z5111 Encounter for antineoplastic chemotherapy: Secondary | ICD-10-CM | POA: Diagnosis not present

## 2020-02-16 MED ORDER — SODIUM CHLORIDE 0.9 % IV SOLN
10.0000 mg | Freq: Once | INTRAVENOUS | Status: AC
  Administered 2020-02-16: 10 mg via INTRAVENOUS
  Filled 2020-02-16: qty 10

## 2020-02-16 MED ORDER — HEPARIN SOD (PORK) LOCK FLUSH 100 UNIT/ML IV SOLN
500.0000 [IU] | Freq: Once | INTRAVENOUS | Status: AC | PRN
  Administered 2020-02-16: 500 [IU]
  Filled 2020-02-16: qty 5

## 2020-02-16 MED ORDER — SODIUM CHLORIDE 0.9 % IV SOLN
Freq: Once | INTRAVENOUS | Status: AC
  Filled 2020-02-16: qty 250

## 2020-02-16 MED ORDER — SODIUM CHLORIDE 0.9 % IV SOLN
75.0000 mg/m2 | Freq: Once | INTRAVENOUS | Status: AC
  Administered 2020-02-16: 135 mg via INTRAVENOUS
  Filled 2020-02-16: qty 13.5

## 2020-02-16 MED ORDER — SODIUM CHLORIDE 0.9% FLUSH
10.0000 mL | INTRAVENOUS | Status: DC | PRN
  Administered 2020-02-16: 10 mL
  Filled 2020-02-16: qty 10

## 2020-02-16 NOTE — Patient Instructions (Signed)
[facility] Discharge Instructions for Patients Receiving Chemotherapy  Today you received the following chemotherapy agents Vidaza  To help prevent nausea and vomiting after your treatment, we encourage you to take your nausea medication as directed  If you develop nausea and vomiting that is not controlled by your nausea medication, call the clinic.   BELOW ARE SYMPTOMS THAT SHOULD BE REPORTED IMMEDIATELY:  *FEVER GREATER THAN 100.5 F  *CHILLS WITH OR WITHOUT FEVER  NAUSEA AND VOMITING THAT IS NOT CONTROLLED WITH YOUR NAUSEA MEDICATION  *UNUSUAL SHORTNESS OF BREATH  *UNUSUAL BRUISING OR BLEEDING  TENDERNESS IN MOUTH AND THROAT WITH OR WITHOUT PRESENCE OF ULCERS  *URINARY PROBLEMS  *BOWEL PROBLEMS  UNUSUAL RASH Items with * indicate a potential emergency and should be followed up as soon as possible.  Feel free to call the clinic should you have any questions or concerns. The clinic phone number is [phone].  Please show the CHEMO ALERT CARD at check-in to the Emergency Department and triage nurse.   

## 2020-02-16 NOTE — Telephone Encounter (Signed)
Scheduled appt per 5/10 los.  Pt will get an updated appt calendar at her next scheduled appt.

## 2020-02-17 ENCOUNTER — Ambulatory Visit: Payer: No Typology Code available for payment source

## 2020-02-17 ENCOUNTER — Other Ambulatory Visit: Payer: Self-pay

## 2020-02-17 ENCOUNTER — Inpatient Hospital Stay: Payer: No Typology Code available for payment source

## 2020-02-17 VITALS — BP 103/65 | HR 75 | Temp 98.0°F | Resp 18

## 2020-02-17 DIAGNOSIS — Z5111 Encounter for antineoplastic chemotherapy: Secondary | ICD-10-CM | POA: Diagnosis not present

## 2020-02-17 DIAGNOSIS — D469 Myelodysplastic syndrome, unspecified: Secondary | ICD-10-CM

## 2020-02-17 MED ORDER — SODIUM CHLORIDE 0.9% FLUSH
10.0000 mL | INTRAVENOUS | Status: DC | PRN
  Administered 2020-02-17: 10 mL
  Filled 2020-02-17: qty 10

## 2020-02-17 MED ORDER — SODIUM CHLORIDE 0.9 % IV SOLN
Freq: Once | INTRAVENOUS | Status: AC
  Filled 2020-02-17: qty 250

## 2020-02-17 MED ORDER — PALONOSETRON HCL INJECTION 0.25 MG/5ML
0.2500 mg | Freq: Once | INTRAVENOUS | Status: AC
  Administered 2020-02-17: 10:00:00 0.25 mg via INTRAVENOUS

## 2020-02-17 MED ORDER — PALONOSETRON HCL INJECTION 0.25 MG/5ML
INTRAVENOUS | Status: AC
  Filled 2020-02-17: qty 5

## 2020-02-17 MED ORDER — SODIUM CHLORIDE 0.9 % IV SOLN
75.0000 mg/m2 | Freq: Once | INTRAVENOUS | Status: AC
  Administered 2020-02-17: 135 mg via INTRAVENOUS
  Filled 2020-02-17: qty 13.5

## 2020-02-17 MED ORDER — HEPARIN SOD (PORK) LOCK FLUSH 100 UNIT/ML IV SOLN
500.0000 [IU] | Freq: Once | INTRAVENOUS | Status: AC | PRN
  Administered 2020-02-17: 500 [IU]
  Filled 2020-02-17: qty 5

## 2020-02-17 MED ORDER — SODIUM CHLORIDE 0.9 % IV SOLN
10.0000 mg | Freq: Once | INTRAVENOUS | Status: AC
  Administered 2020-02-17: 10:00:00 10 mg via INTRAVENOUS
  Filled 2020-02-17: qty 10

## 2020-02-17 NOTE — Patient Instructions (Signed)
[facility] Discharge Instructions for Patients Receiving Chemotherapy  Today you received the following chemotherapy agents: azacitidine.  To help prevent nausea and vomiting after your treatment, we encourage you to take your nausea medication as directed.   If you develop nausea and vomiting that is not controlled by your nausea medication, call the clinic.   BELOW ARE SYMPTOMS THAT SHOULD BE REPORTED IMMEDIATELY:  *FEVER GREATER THAN 100.5 F  *CHILLS WITH OR WITHOUT FEVER  NAUSEA AND VOMITING THAT IS NOT CONTROLLED WITH YOUR NAUSEA MEDICATION  *UNUSUAL SHORTNESS OF BREATH  *UNUSUAL BRUISING OR BLEEDING  TENDERNESS IN MOUTH AND THROAT WITH OR WITHOUT PRESENCE OF ULCERS  *URINARY PROBLEMS  *BOWEL PROBLEMS  UNUSUAL RASH Items with * indicate a potential emergency and should be followed up as soon as possible.  Feel free to call the clinic should you have any questions or concerns. The clinic phone number is [phone].  Please show the CHEMO ALERT CARD at check-in to the Emergency Department and triage nurse.   

## 2020-02-18 ENCOUNTER — Inpatient Hospital Stay: Payer: No Typology Code available for payment source

## 2020-02-18 ENCOUNTER — Other Ambulatory Visit: Payer: Self-pay

## 2020-02-18 ENCOUNTER — Ambulatory Visit: Payer: No Typology Code available for payment source

## 2020-02-18 VITALS — BP 107/74 | HR 73 | Temp 98.2°F | Resp 18

## 2020-02-18 DIAGNOSIS — Z5111 Encounter for antineoplastic chemotherapy: Secondary | ICD-10-CM | POA: Diagnosis not present

## 2020-02-18 DIAGNOSIS — D469 Myelodysplastic syndrome, unspecified: Secondary | ICD-10-CM

## 2020-02-18 MED ORDER — SODIUM CHLORIDE 0.9% FLUSH
10.0000 mL | INTRAVENOUS | Status: DC | PRN
  Administered 2020-02-18: 10 mL
  Filled 2020-02-18: qty 10

## 2020-02-18 MED ORDER — SODIUM CHLORIDE 0.9 % IV SOLN
Freq: Once | INTRAVENOUS | Status: AC
  Filled 2020-02-18: qty 250

## 2020-02-18 MED ORDER — SODIUM CHLORIDE 0.9 % IV SOLN
75.0000 mg/m2 | Freq: Once | INTRAVENOUS | Status: AC
  Administered 2020-02-18: 135 mg via INTRAVENOUS
  Filled 2020-02-18: qty 13.5

## 2020-02-18 MED ORDER — HEPARIN SOD (PORK) LOCK FLUSH 100 UNIT/ML IV SOLN
500.0000 [IU] | Freq: Once | INTRAVENOUS | Status: AC | PRN
  Administered 2020-02-18: 500 [IU]
  Filled 2020-02-18: qty 5

## 2020-02-18 MED ORDER — SODIUM CHLORIDE 0.9 % IV SOLN
10.0000 mg | Freq: Once | INTRAVENOUS | Status: AC
  Administered 2020-02-18: 10 mg via INTRAVENOUS
  Filled 2020-02-18: qty 10

## 2020-02-18 NOTE — Patient Instructions (Signed)
[facility] Discharge Instructions for Patients Receiving Chemotherapy  Today you received the following chemotherapy agents: azacitidine.  To help prevent nausea and vomiting after your treatment, we encourage you to take your nausea medication as directed.   If you develop nausea and vomiting that is not controlled by your nausea medication, call the clinic.   BELOW ARE SYMPTOMS THAT SHOULD BE REPORTED IMMEDIATELY:  *FEVER GREATER THAN 100.5 F  *CHILLS WITH OR WITHOUT FEVER  NAUSEA AND VOMITING THAT IS NOT CONTROLLED WITH YOUR NAUSEA MEDICATION  *UNUSUAL SHORTNESS OF BREATH  *UNUSUAL BRUISING OR BLEEDING  TENDERNESS IN MOUTH AND THROAT WITH OR WITHOUT PRESENCE OF ULCERS  *URINARY PROBLEMS  *BOWEL PROBLEMS  UNUSUAL RASH Items with * indicate a potential emergency and should be followed up as soon as possible.  Feel free to call the clinic should you have any questions or concerns. The clinic phone number is [phone].  Please show the CHEMO ALERT CARD at check-in to the Emergency Department and triage nurse.   

## 2020-02-19 ENCOUNTER — Ambulatory Visit: Payer: No Typology Code available for payment source

## 2020-02-19 ENCOUNTER — Inpatient Hospital Stay: Payer: No Typology Code available for payment source

## 2020-02-19 ENCOUNTER — Other Ambulatory Visit: Payer: Self-pay

## 2020-02-19 VITALS — BP 99/56 | HR 72 | Temp 98.9°F | Resp 18

## 2020-02-19 DIAGNOSIS — Z5111 Encounter for antineoplastic chemotherapy: Secondary | ICD-10-CM | POA: Diagnosis not present

## 2020-02-19 DIAGNOSIS — D469 Myelodysplastic syndrome, unspecified: Secondary | ICD-10-CM

## 2020-02-19 MED ORDER — PALONOSETRON HCL INJECTION 0.25 MG/5ML
0.2500 mg | Freq: Once | INTRAVENOUS | Status: AC
  Administered 2020-02-19: 0.25 mg via INTRAVENOUS

## 2020-02-19 MED ORDER — SODIUM CHLORIDE 0.9% FLUSH
10.0000 mL | INTRAVENOUS | Status: DC | PRN
  Administered 2020-02-19: 10 mL
  Filled 2020-02-19: qty 10

## 2020-02-19 MED ORDER — PALONOSETRON HCL INJECTION 0.25 MG/5ML
INTRAVENOUS | Status: AC
  Filled 2020-02-19: qty 5

## 2020-02-19 MED ORDER — SODIUM CHLORIDE 0.9 % IV SOLN
10.0000 mg | Freq: Once | INTRAVENOUS | Status: AC
  Administered 2020-02-19: 10 mg via INTRAVENOUS
  Filled 2020-02-19: qty 10

## 2020-02-19 MED ORDER — SODIUM CHLORIDE 0.9 % IV SOLN
75.0000 mg/m2 | Freq: Once | INTRAVENOUS | Status: AC
  Administered 2020-02-19: 135 mg via INTRAVENOUS
  Filled 2020-02-19: qty 13.5

## 2020-02-19 MED ORDER — HEPARIN SOD (PORK) LOCK FLUSH 100 UNIT/ML IV SOLN
500.0000 [IU] | Freq: Once | INTRAVENOUS | Status: AC | PRN
  Administered 2020-02-19: 500 [IU]
  Filled 2020-02-19: qty 5

## 2020-02-19 MED ORDER — SODIUM CHLORIDE 0.9 % IV SOLN
Freq: Once | INTRAVENOUS | Status: AC
  Filled 2020-02-19: qty 250

## 2020-02-19 NOTE — Patient Instructions (Signed)
[facility] Discharge Instructions for Patients Receiving Chemotherapy  Today you received the following chemotherapy agents Vidaza  To help prevent nausea and vomiting after your treatment, we encourage you to take your nausea medication as directed  If you develop nausea and vomiting that is not controlled by your nausea medication, call the clinic.   BELOW ARE SYMPTOMS THAT SHOULD BE REPORTED IMMEDIATELY:  *FEVER GREATER THAN 100.5 F  *CHILLS WITH OR WITHOUT FEVER  NAUSEA AND VOMITING THAT IS NOT CONTROLLED WITH YOUR NAUSEA MEDICATION  *UNUSUAL SHORTNESS OF BREATH  *UNUSUAL BRUISING OR BLEEDING  TENDERNESS IN MOUTH AND THROAT WITH OR WITHOUT PRESENCE OF ULCERS  *URINARY PROBLEMS  *BOWEL PROBLEMS  UNUSUAL RASH Items with * indicate a potential emergency and should be followed up as soon as possible.  Feel free to call the clinic should you have any questions or concerns. The clinic phone number is [phone].  Please show the CHEMO ALERT CARD at check-in to the Emergency Department and triage nurse.   

## 2020-02-22 ENCOUNTER — Inpatient Hospital Stay (HOSPITAL_BASED_OUTPATIENT_CLINIC_OR_DEPARTMENT_OTHER): Payer: No Typology Code available for payment source | Admitting: Medical

## 2020-02-22 ENCOUNTER — Ambulatory Visit: Payer: No Typology Code available for payment source

## 2020-02-22 ENCOUNTER — Ambulatory Visit (INDEPENDENT_AMBULATORY_CARE_PROVIDER_SITE_OTHER): Payer: No Typology Code available for payment source | Admitting: *Deleted

## 2020-02-22 ENCOUNTER — Other Ambulatory Visit: Payer: No Typology Code available for payment source

## 2020-02-22 ENCOUNTER — Inpatient Hospital Stay: Payer: No Typology Code available for payment source

## 2020-02-22 ENCOUNTER — Other Ambulatory Visit: Payer: Self-pay

## 2020-02-22 ENCOUNTER — Telehealth: Payer: Self-pay | Admitting: *Deleted

## 2020-02-22 VITALS — BP 83/55 | HR 92 | Temp 98.2°F | Resp 18 | Wt 150.0 lb

## 2020-02-22 DIAGNOSIS — Z5111 Encounter for antineoplastic chemotherapy: Secondary | ICD-10-CM | POA: Diagnosis not present

## 2020-02-22 DIAGNOSIS — Z95828 Presence of other vascular implants and grafts: Secondary | ICD-10-CM

## 2020-02-22 DIAGNOSIS — I639 Cerebral infarction, unspecified: Secondary | ICD-10-CM

## 2020-02-22 DIAGNOSIS — D469 Myelodysplastic syndrome, unspecified: Secondary | ICD-10-CM

## 2020-02-22 DIAGNOSIS — D649 Anemia, unspecified: Secondary | ICD-10-CM

## 2020-02-22 DIAGNOSIS — I10 Essential (primary) hypertension: Secondary | ICD-10-CM

## 2020-02-22 LAB — CBC WITH DIFFERENTIAL (CANCER CENTER ONLY)
Abs Immature Granulocytes: 0.09 10*3/uL — ABNORMAL HIGH (ref 0.00–0.07)
Basophils Absolute: 0 10*3/uL (ref 0.0–0.1)
Basophils Relative: 0 %
Eosinophils Absolute: 0.1 10*3/uL (ref 0.0–0.5)
Eosinophils Relative: 1 %
HCT: 25.8 % — ABNORMAL LOW (ref 36.0–46.0)
Hemoglobin: 8.4 g/dL — ABNORMAL LOW (ref 12.0–15.0)
Immature Granulocytes: 1 %
Lymphocytes Relative: 13 %
Lymphs Abs: 1 10*3/uL (ref 0.7–4.0)
MCH: 31.8 pg (ref 26.0–34.0)
MCHC: 32.6 g/dL (ref 30.0–36.0)
MCV: 97.7 fL (ref 80.0–100.0)
Monocytes Absolute: 0.1 10*3/uL (ref 0.1–1.0)
Monocytes Relative: 1 %
Neutro Abs: 6.2 10*3/uL (ref 1.7–7.7)
Neutrophils Relative %: 84 %
Platelet Count: 807 10*3/uL — ABNORMAL HIGH (ref 150–400)
RBC: 2.64 MIL/uL — ABNORMAL LOW (ref 3.87–5.11)
RDW: 15.7 % — ABNORMAL HIGH (ref 11.5–15.5)
WBC Count: 7.5 10*3/uL (ref 4.0–10.5)
nRBC: 0.3 % — ABNORMAL HIGH (ref 0.0–0.2)

## 2020-02-22 LAB — CMP (CANCER CENTER ONLY)
ALT: 8 U/L (ref 0–44)
AST: 11 U/L — ABNORMAL LOW (ref 15–41)
Albumin: 2.9 g/dL — ABNORMAL LOW (ref 3.5–5.0)
Alkaline Phosphatase: 51 U/L (ref 38–126)
Anion gap: 10 (ref 5–15)
BUN: 18 mg/dL (ref 8–23)
CO2: 30 mmol/L (ref 22–32)
Calcium: 9.5 mg/dL (ref 8.9–10.3)
Chloride: 100 mmol/L (ref 98–111)
Creatinine: 1.19 mg/dL — ABNORMAL HIGH (ref 0.44–1.00)
GFR, Est AFR Am: 57 mL/min — ABNORMAL LOW (ref >60)
GFR, Estimated: 49 mL/min — ABNORMAL LOW (ref >60)
Glucose, Bld: 90 mg/dL (ref 70–99)
Potassium: 3.7 mmol/L (ref 3.5–5.1)
Sodium: 140 mmol/L (ref 135–145)
Total Bilirubin: 0.7 mg/dL (ref 0.3–1.2)
Total Protein: 6.7 g/dL (ref 6.5–8.1)

## 2020-02-22 LAB — URIC ACID: Uric Acid, Serum: <2 mg/dL — ABNORMAL LOW (ref 2.5–7.1)

## 2020-02-22 LAB — CUP PACEART REMOTE DEVICE CHECK
Date Time Interrogation Session: 20210516123648
Implantable Pulse Generator Implant Date: 20200114

## 2020-02-22 LAB — LACTATE DEHYDROGENASE: LDH: 217 U/L — ABNORMAL HIGH (ref 98–192)

## 2020-02-22 LAB — PHOSPHORUS: Phosphorus: 4.4 mg/dL (ref 2.5–4.6)

## 2020-02-22 LAB — SAMPLE TO BLOOD BANK

## 2020-02-22 MED ORDER — PALONOSETRON HCL INJECTION 0.25 MG/5ML
0.2500 mg | Freq: Once | INTRAVENOUS | Status: AC
  Administered 2020-02-22: 0.25 mg via INTRAVENOUS

## 2020-02-22 MED ORDER — SODIUM CHLORIDE 0.9 % IV SOLN
Freq: Once | INTRAVENOUS | Status: AC
  Filled 2020-02-22: qty 250

## 2020-02-22 MED ORDER — SODIUM CHLORIDE 0.9% FLUSH
10.0000 mL | INTRAVENOUS | Status: DC | PRN
  Administered 2020-02-22: 10 mL
  Filled 2020-02-22: qty 10

## 2020-02-22 MED ORDER — SODIUM CHLORIDE 0.9 % IV SOLN
10.0000 mg | Freq: Once | INTRAVENOUS | Status: AC
  Administered 2020-02-22: 10 mg via INTRAVENOUS
  Filled 2020-02-22: qty 10

## 2020-02-22 MED ORDER — HEPARIN SOD (PORK) LOCK FLUSH 100 UNIT/ML IV SOLN
500.0000 [IU] | Freq: Once | INTRAVENOUS | Status: AC | PRN
  Administered 2020-02-22: 500 [IU]
  Filled 2020-02-22: qty 5

## 2020-02-22 MED ORDER — SODIUM CHLORIDE 0.9 % IV SOLN
75.0000 mg/m2 | Freq: Once | INTRAVENOUS | Status: AC
  Administered 2020-02-22: 135 mg via INTRAVENOUS
  Filled 2020-02-22: qty 13.5

## 2020-02-22 MED ORDER — SODIUM CHLORIDE 0.9% FLUSH
10.0000 mL | INTRAVENOUS | Status: DC | PRN
  Administered 2020-02-22: 10 mL via INTRAVENOUS
  Filled 2020-02-22: qty 10

## 2020-02-22 MED ORDER — PALONOSETRON HCL INJECTION 0.25 MG/5ML
INTRAVENOUS | Status: AC
  Filled 2020-02-22: qty 5

## 2020-02-22 NOTE — Patient Instructions (Signed)
[facility] Discharge Instructions for Patients Receiving Chemotherapy  Today you received the following chemotherapy agents Vidaza  To help prevent nausea and vomiting after your treatment, we encourage you to take your nausea medication as directed  If you develop nausea and vomiting that is not controlled by your nausea medication, call the clinic.   BELOW ARE SYMPTOMS THAT SHOULD BE REPORTED IMMEDIATELY:  *FEVER GREATER THAN 100.5 F  *CHILLS WITH OR WITHOUT FEVER  NAUSEA AND VOMITING THAT IS NOT CONTROLLED WITH YOUR NAUSEA MEDICATION  *UNUSUAL SHORTNESS OF BREATH  *UNUSUAL BRUISING OR BLEEDING  TENDERNESS IN MOUTH AND THROAT WITH OR WITHOUT PRESENCE OF ULCERS  *URINARY PROBLEMS  *BOWEL PROBLEMS  UNUSUAL RASH Items with * indicate a potential emergency and should be followed up as soon as possible.  Feel free to call the clinic should you have any questions or concerns. The clinic phone number is [phone].  Please show the CHEMO ALERT CARD at check-in to the Emergency Department and triage nurse.   

## 2020-02-22 NOTE — Telephone Encounter (Signed)
Disability had been approved through 5/24. Pt will be undergoing further treatment. Faxed latest office note and updated calendar to [facility] @ [phone].  Pt notified

## 2020-02-23 ENCOUNTER — Other Ambulatory Visit: Payer: Self-pay

## 2020-02-23 ENCOUNTER — Ambulatory Visit: Payer: No Typology Code available for payment source

## 2020-02-23 ENCOUNTER — Inpatient Hospital Stay: Payer: No Typology Code available for payment source

## 2020-02-23 VITALS — BP 76/51 | HR 96 | Resp 18

## 2020-02-23 DIAGNOSIS — H6123 Impacted cerumen, bilateral: Secondary | ICD-10-CM | POA: Diagnosis not present

## 2020-02-23 DIAGNOSIS — D469 Myelodysplastic syndrome, unspecified: Secondary | ICD-10-CM

## 2020-02-23 DIAGNOSIS — Z5111 Encounter for antineoplastic chemotherapy: Secondary | ICD-10-CM | POA: Diagnosis not present

## 2020-02-23 MED ORDER — SODIUM CHLORIDE 0.9 % IV SOLN
Freq: Once | INTRAVENOUS | Status: AC
  Filled 2020-02-23: qty 250

## 2020-02-23 MED ORDER — SODIUM CHLORIDE 0.9 % IV SOLN
75.0000 mg/m2 | Freq: Once | INTRAVENOUS | Status: AC
  Administered 2020-02-23: 135 mg via INTRAVENOUS
  Filled 2020-02-23: qty 13.5

## 2020-02-23 MED ORDER — HEPARIN SOD (PORK) LOCK FLUSH 100 UNIT/ML IV SOLN
500.0000 [IU] | Freq: Once | INTRAVENOUS | Status: AC | PRN
  Administered 2020-02-23: 500 [IU]
  Filled 2020-02-23: qty 5

## 2020-02-23 MED ORDER — SODIUM CHLORIDE 0.9% FLUSH
10.0000 mL | INTRAVENOUS | Status: DC | PRN
  Administered 2020-02-23: 10 mL
  Filled 2020-02-23: qty 10

## 2020-02-23 MED ORDER — SODIUM CHLORIDE 0.9 % IV SOLN
10.0000 mg | Freq: Once | INTRAVENOUS | Status: AC
  Administered 2020-02-23: 10 mg via INTRAVENOUS
  Filled 2020-02-23: qty 10

## 2020-02-23 MED ORDER — PALONOSETRON HCL INJECTION 0.25 MG/5ML: 0.2500 mg | Freq: Once | INTRAVENOUS | Status: DC

## 2020-02-23 NOTE — Patient Instructions (Signed)
[facility] Discharge Instructions for Patients Receiving Chemotherapy  Today you received the following chemotherapy agents Vidaza  To help prevent nausea and vomiting after your treatment, we encourage you to take your nausea medication as directed  If you develop nausea and vomiting that is not controlled by your nausea medication, call the clinic.   BELOW ARE SYMPTOMS THAT SHOULD BE REPORTED IMMEDIATELY:  *FEVER GREATER THAN 100.5 F  *CHILLS WITH OR WITHOUT FEVER  NAUSEA AND VOMITING THAT IS NOT CONTROLLED WITH YOUR NAUSEA MEDICATION  *UNUSUAL SHORTNESS OF BREATH  *UNUSUAL BRUISING OR BLEEDING  TENDERNESS IN MOUTH AND THROAT WITH OR WITHOUT PRESENCE OF ULCERS  *URINARY PROBLEMS  *BOWEL PROBLEMS  UNUSUAL RASH Items with * indicate a potential emergency and should be followed up as soon as possible.  Feel free to call the clinic should you have any questions or concerns. The clinic phone number is [phone].  Please show the CHEMO ALERT CARD at check-in to the Emergency Department and triage nurse.   

## 2020-02-23 NOTE — Progress Notes (Signed)
[facility] Summary Report / Loop Recorder 

## 2020-02-23 NOTE — Progress Notes (Signed)
Pt states she has passed out at home 01/18/23 morning & then again last evening after her tx.  BP today in infusion 84/51 & 76/51.  Shelia Media PA informed, is coming to see patient.

## 2020-02-23 NOTE — Progress Notes (Signed)
The patient was seen in infusion today.  She was seen after reporting that she has had several falls recently at home.  Her first fall occurred in her kitchen when she simply woke up after finding herself on the kitchen floor.  She does not recall the mechanism of her fall.  She had no injury to report at that time.  She fell again yesterday and reported that she simply became dizzy and fell.  She is not on Tribenzor 40-10-20 5 mg tablets once daily.  She reports that she was on metoprolol but her primary care provider wean her from this.  Her vital signs today show a temp of 98.2, blood pressure of 83/55 and oxygen of 100% on room air.  It was recommended to her that she change her Tribenzor to every other day dosing to see if there will be a decrease in her falls.  She is agreeable to make this change.  Sandi Mealy, MHS, PA-C Physician Assistant

## 2020-03-01 ENCOUNTER — Telehealth: Payer: Self-pay

## 2020-03-01 ENCOUNTER — Inpatient Hospital Stay: Payer: No Typology Code available for payment source

## 2020-03-01 ENCOUNTER — Other Ambulatory Visit: Payer: Self-pay

## 2020-03-01 VITALS — BP 82/53 | HR 84 | Temp 98.2°F | Resp 18 | Wt 148.0 lb

## 2020-03-01 DIAGNOSIS — D469 Myelodysplastic syndrome, unspecified: Secondary | ICD-10-CM

## 2020-03-01 DIAGNOSIS — D649 Anemia, unspecified: Secondary | ICD-10-CM

## 2020-03-01 DIAGNOSIS — Z5111 Encounter for antineoplastic chemotherapy: Secondary | ICD-10-CM | POA: Diagnosis not present

## 2020-03-01 LAB — CBC WITH DIFFERENTIAL (CANCER CENTER ONLY)
Abs Immature Granulocytes: 0.05 10*3/uL (ref 0.00–0.07)
Basophils Absolute: 0.1 10*3/uL (ref 0.0–0.1)
Basophils Relative: 2 %
Eosinophils Absolute: 0.5 10*3/uL (ref 0.0–0.5)
Eosinophils Relative: 14 %
HCT: 21 % — ABNORMAL LOW (ref 36.0–46.0)
Hemoglobin: 6.7 g/dL — CL (ref 12.0–15.0)
Immature Granulocytes: 2 %
Lymphocytes Relative: 19 %
Lymphs Abs: 0.7 10*3/uL (ref 0.7–4.0)
MCH: 31.9 pg (ref 26.0–34.0)
MCHC: 31.9 g/dL (ref 30.0–36.0)
MCV: 100 fL (ref 80.0–100.0)
Monocytes Absolute: 0.1 10*3/uL (ref 0.1–1.0)
Monocytes Relative: 2 %
Neutro Abs: 2.1 10*3/uL (ref 1.7–7.7)
Neutrophils Relative %: 61 %
Platelet Count: 447 10*3/uL — ABNORMAL HIGH (ref 150–400)
RBC: 2.1 MIL/uL — ABNORMAL LOW (ref 3.87–5.11)
RDW: 16 % — ABNORMAL HIGH (ref 11.5–15.5)
WBC Count: 3.4 10*3/uL — ABNORMAL LOW (ref 4.0–10.5)
nRBC: 0 % (ref 0.0–0.2)

## 2020-03-01 LAB — PREPARE RBC (CROSSMATCH)

## 2020-03-01 LAB — CMP (CANCER CENTER ONLY)
ALT: 6 U/L (ref 0–44)
AST: 7 U/L — ABNORMAL LOW (ref 15–41)
Albumin: 2.8 g/dL — ABNORMAL LOW (ref 3.5–5.0)
Alkaline Phosphatase: 54 U/L (ref 38–126)
Anion gap: 7 (ref 5–15)
BUN: 10 mg/dL (ref 8–23)
CO2: 27 mmol/L (ref 22–32)
Calcium: 9.4 mg/dL (ref 8.9–10.3)
Chloride: 105 mmol/L (ref 98–111)
Creatinine: 0.9 mg/dL (ref 0.44–1.00)
GFR, Est AFR Am: >60 mL/min (ref >60)
GFR, Estimated: >60 mL/min (ref >60)
Glucose, Bld: 96 mg/dL (ref 70–99)
Potassium: 3.5 mmol/L (ref 3.5–5.1)
Sodium: 139 mmol/L (ref 135–145)
Total Bilirubin: 0.5 mg/dL (ref 0.3–1.2)
Total Protein: 6.5 g/dL (ref 6.5–8.1)

## 2020-03-01 LAB — PHOSPHORUS: Phosphorus: 3.3 mg/dL (ref 2.5–4.6)

## 2020-03-01 LAB — SAMPLE TO BLOOD BANK

## 2020-03-01 LAB — URIC ACID: Uric Acid, Serum: <2 mg/dL — ABNORMAL LOW (ref 2.5–7.1)

## 2020-03-01 MED ORDER — SODIUM CHLORIDE 0.9% IV SOLUTION
250.0000 mL | Freq: Once | INTRAVENOUS | Status: AC
  Administered 2020-03-01: 250 mL via INTRAVENOUS
  Filled 2020-03-01: qty 250

## 2020-03-01 MED ORDER — ACETAMINOPHEN 325 MG PO TABS
ORAL_TABLET | ORAL | Status: AC
  Filled 2020-03-01: qty 2

## 2020-03-01 MED ORDER — DIPHENHYDRAMINE HCL 25 MG PO CAPS
25.0000 mg | ORAL_CAPSULE | Freq: Once | ORAL | Status: AC
  Administered 2020-03-01: 25 mg via ORAL

## 2020-03-01 MED ORDER — ACETAMINOPHEN 325 MG PO TABS
650.0000 mg | ORAL_TABLET | Freq: Once | ORAL | Status: AC
  Administered 2020-03-01: 650 mg via ORAL

## 2020-03-01 MED ORDER — HEPARIN SOD (PORK) LOCK FLUSH 100 UNIT/ML IV SOLN
500.0000 [IU] | Freq: Every day | INTRAVENOUS | Status: AC | PRN
  Administered 2020-03-01: 500 [IU]
  Filled 2020-03-01: qty 5

## 2020-03-01 MED ORDER — SODIUM CHLORIDE 0.9% FLUSH
10.0000 mL | INTRAVENOUS | Status: AC | PRN
  Administered 2020-03-01: 10 mL
  Filled 2020-03-01: qty 10

## 2020-03-01 MED ORDER — DIPHENHYDRAMINE HCL 25 MG PO CAPS
ORAL_CAPSULE | ORAL | Status: AC
  Filled 2020-03-01: qty 1

## 2020-03-01 MED ORDER — SODIUM CHLORIDE 0.9% FLUSH
10.0000 mL | INTRAVENOUS | Status: DC | PRN
  Administered 2020-03-01: 10 mL via INTRAVENOUS
  Filled 2020-03-01: qty 10

## 2020-03-01 NOTE — Telephone Encounter (Signed)
CRITICAL VALUE STICKER  CRITICAL VALUE: HGB   RECEIVER (on-site recipient of call): Lenox Ponds LPN  DATE & TIME NOTIFIED:  03/04/20 1154  MESSENGER (representative from lab): [facility]  MD NOTIFIED:  Dr. Alen Blew   TIME OF NOTIFICATION: [id number]  RESPONSE: Pt to receive 2 units of RBC today

## 2020-03-01 NOTE — Patient Instructions (Signed)

## 2020-03-02 LAB — BPAM RBC
Blood Product Expiration Date: 202106242359
Blood Product Expiration Date: 202106242359
ISSUE DATE / TIME: 202105251303
ISSUE DATE / TIME: 202105251303
Unit Type and Rh: 5100
Unit Type and Rh: 5100

## 2020-03-02 LAB — TYPE AND SCREEN
ABO/RH(D): O POS
Antibody Screen: NEGATIVE
Unit division: 0
Unit division: 0

## 2020-03-02 LAB — LACTATE DEHYDROGENASE: LDH: 264 U/L — ABNORMAL HIGH (ref 98–192)

## 2020-03-04 DIAGNOSIS — D469 Myelodysplastic syndrome, unspecified: Secondary | ICD-10-CM | POA: Diagnosis not present

## 2020-03-04 DIAGNOSIS — H524 Presbyopia: Secondary | ICD-10-CM | POA: Diagnosis not present

## 2020-03-04 DIAGNOSIS — H25813 Combined forms of age-related cataract, bilateral: Secondary | ICD-10-CM | POA: Diagnosis not present

## 2020-03-08 ENCOUNTER — Inpatient Hospital Stay: Payer: No Typology Code available for payment source | Attending: Oncology

## 2020-03-08 ENCOUNTER — Inpatient Hospital Stay: Payer: No Typology Code available for payment source

## 2020-03-08 ENCOUNTER — Other Ambulatory Visit: Payer: Self-pay

## 2020-03-08 ENCOUNTER — Other Ambulatory Visit: Payer: Self-pay | Admitting: Oncology

## 2020-03-08 DIAGNOSIS — Z5111 Encounter for antineoplastic chemotherapy: Secondary | ICD-10-CM | POA: Diagnosis not present

## 2020-03-08 DIAGNOSIS — R55 Syncope and collapse: Secondary | ICD-10-CM | POA: Insufficient documentation

## 2020-03-08 DIAGNOSIS — D469 Myelodysplastic syndrome, unspecified: Secondary | ICD-10-CM

## 2020-03-08 DIAGNOSIS — E876 Hypokalemia: Secondary | ICD-10-CM | POA: Diagnosis not present

## 2020-03-08 DIAGNOSIS — Z79899 Other long term (current) drug therapy: Secondary | ICD-10-CM | POA: Diagnosis not present

## 2020-03-08 DIAGNOSIS — Z7982 Long term (current) use of aspirin: Secondary | ICD-10-CM | POA: Insufficient documentation

## 2020-03-08 DIAGNOSIS — D649 Anemia, unspecified: Secondary | ICD-10-CM

## 2020-03-08 LAB — CBC WITH DIFFERENTIAL (CANCER CENTER ONLY)
Abs Immature Granulocytes: 0.03 10*3/uL (ref 0.00–0.07)
Basophils Absolute: 0.1 10*3/uL (ref 0.0–0.1)
Basophils Relative: 2 %
Eosinophils Absolute: 0.5 10*3/uL (ref 0.0–0.5)
Eosinophils Relative: 21 %
HCT: 26.5 % — ABNORMAL LOW (ref 36.0–46.0)
Hemoglobin: 8.7 g/dL — ABNORMAL LOW (ref 12.0–15.0)
Immature Granulocytes: 1 %
Lymphocytes Relative: 33 %
Lymphs Abs: 0.8 10*3/uL (ref 0.7–4.0)
MCH: 31.1 pg (ref 26.0–34.0)
MCHC: 32.8 g/dL (ref 30.0–36.0)
MCV: 94.6 fL (ref 80.0–100.0)
Monocytes Absolute: 0.1 10*3/uL (ref 0.1–1.0)
Monocytes Relative: 3 %
Neutro Abs: 1 10*3/uL — ABNORMAL LOW (ref 1.7–7.7)
Neutrophils Relative %: 40 %
Platelet Count: 301 10*3/uL (ref 150–400)
RBC: 2.8 MIL/uL — ABNORMAL LOW (ref 3.87–5.11)
RDW: 16.6 % — ABNORMAL HIGH (ref 11.5–15.5)
WBC Count: 2.4 10*3/uL — ABNORMAL LOW (ref 4.0–10.5)
nRBC: 0 % (ref 0.0–0.2)

## 2020-03-08 LAB — PREPARE RBC (CROSSMATCH)

## 2020-03-08 LAB — CMP (CANCER CENTER ONLY)
ALT: 8 U/L (ref 0–44)
AST: 9 U/L — ABNORMAL LOW (ref 15–41)
Albumin: 2.8 g/dL — ABNORMAL LOW (ref 3.5–5.0)
Alkaline Phosphatase: 57 U/L (ref 38–126)
Anion gap: 10 (ref 5–15)
BUN: 11 mg/dL (ref 8–23)
CO2: 25 mmol/L (ref 22–32)
Calcium: 9.6 mg/dL (ref 8.9–10.3)
Chloride: 106 mmol/L (ref 98–111)
Creatinine: 0.74 mg/dL (ref 0.44–1.00)
GFR, Est AFR Am: >60 mL/min (ref >60)
GFR, Estimated: >60 mL/min (ref >60)
Glucose, Bld: 79 mg/dL (ref 70–99)
Potassium: 3.3 mmol/L — ABNORMAL LOW (ref 3.5–5.1)
Sodium: 141 mmol/L (ref 135–145)
Total Bilirubin: 0.7 mg/dL (ref 0.3–1.2)
Total Protein: 6.9 g/dL (ref 6.5–8.1)

## 2020-03-08 LAB — LACTATE DEHYDROGENASE: LDH: 160 U/L (ref 98–192)

## 2020-03-08 LAB — PHOSPHORUS: Phosphorus: 3.6 mg/dL (ref 2.5–4.6)

## 2020-03-08 LAB — URIC ACID: Uric Acid, Serum: <2 mg/dL — ABNORMAL LOW (ref 2.5–7.1)

## 2020-03-08 LAB — SAMPLE TO BLOOD BANK

## 2020-03-08 MED ORDER — SODIUM CHLORIDE 0.9% FLUSH
3.0000 mL | INTRAVENOUS | Status: AC | PRN
  Administered 2020-03-08: 3 mL
  Filled 2020-03-08: qty 10

## 2020-03-08 MED ORDER — DIPHENHYDRAMINE HCL 25 MG PO CAPS
ORAL_CAPSULE | ORAL | Status: AC
  Filled 2020-03-08: qty 1

## 2020-03-08 MED ORDER — ACETAMINOPHEN 325 MG PO TABS
ORAL_TABLET | ORAL | Status: AC
  Filled 2020-03-08: qty 2

## 2020-03-08 MED ORDER — SODIUM CHLORIDE 0.9% IV SOLUTION
250.0000 mL | Freq: Once | INTRAVENOUS | Status: DC
  Filled 2020-03-08: qty 250

## 2020-03-08 MED ORDER — ACETAMINOPHEN 325 MG PO TABS
650.0000 mg | ORAL_TABLET | Freq: Once | ORAL | Status: AC
  Administered 2020-03-08: 650 mg via ORAL

## 2020-03-08 MED ORDER — HEPARIN SOD (PORK) LOCK FLUSH 100 UNIT/ML IV SOLN
250.0000 [IU] | INTRAVENOUS | Status: AC | PRN
  Administered 2020-03-08: 250 [IU]
  Filled 2020-03-08: qty 5

## 2020-03-08 MED ORDER — DIPHENHYDRAMINE HCL 25 MG PO CAPS
25.0000 mg | ORAL_CAPSULE | Freq: Once | ORAL | Status: AC
  Administered 2020-03-08: 25 mg via ORAL

## 2020-03-08 NOTE — Patient Instructions (Signed)

## 2020-03-08 NOTE — Patient Instructions (Signed)

## 2020-03-09 LAB — BPAM RBC
Blood Product Expiration Date: 202106282359
ISSUE DATE / TIME: 202106011307
Unit Type and Rh: 5100

## 2020-03-09 LAB — TYPE AND SCREEN
ABO/RH(D): O POS
Antibody Screen: NEGATIVE
Unit division: 0

## 2020-03-09 NOTE — Progress Notes (Signed)
Pharmacist Chemotherapy Monitoring - Follow Up Assessment    I verify that I have reviewed each item in the below checklist:  . Regimen for the patient is scheduled for the appropriate day and plan matches scheduled date. Marland Kitchen Appropriate non-routine labs are ordered dependent on drug ordered. . If applicable, additional medications reviewed and ordered per protocol based on lifetime cumulative doses and/or treatment regimen.   Plan for follow-up and/or issues identified: No . I-vent associated with next due treatment: No . MD and/or nursing notified: No  Breanna Perry D 03/09/2020 4:15 PM

## 2020-03-10 ENCOUNTER — Encounter: Payer: Self-pay | Admitting: Oncology

## 2020-03-14 ENCOUNTER — Inpatient Hospital Stay: Payer: No Typology Code available for payment source

## 2020-03-14 ENCOUNTER — Telehealth: Payer: Self-pay

## 2020-03-14 ENCOUNTER — Other Ambulatory Visit: Payer: Self-pay

## 2020-03-14 ENCOUNTER — Telehealth: Payer: Self-pay | Admitting: Oncology

## 2020-03-14 ENCOUNTER — Inpatient Hospital Stay (HOSPITAL_BASED_OUTPATIENT_CLINIC_OR_DEPARTMENT_OTHER): Payer: No Typology Code available for payment source | Admitting: Oncology

## 2020-03-14 VITALS — BP 110/70 | HR 79 | Temp 97.5°F | Resp 18 | Ht 63.0 in | Wt 152.6 lb

## 2020-03-14 DIAGNOSIS — Z5111 Encounter for antineoplastic chemotherapy: Secondary | ICD-10-CM | POA: Diagnosis not present

## 2020-03-14 DIAGNOSIS — Z95828 Presence of other vascular implants and grafts: Secondary | ICD-10-CM

## 2020-03-14 DIAGNOSIS — D469 Myelodysplastic syndrome, unspecified: Secondary | ICD-10-CM

## 2020-03-14 DIAGNOSIS — D649 Anemia, unspecified: Secondary | ICD-10-CM

## 2020-03-14 LAB — CBC WITH DIFFERENTIAL (CANCER CENTER ONLY)
Abs Immature Granulocytes: 0.01 10*3/uL (ref 0.00–0.07)
Basophils Absolute: 0 10*3/uL (ref 0.0–0.1)
Basophils Relative: 2 %
Eosinophils Absolute: 0.7 10*3/uL — ABNORMAL HIGH (ref 0.0–0.5)
Eosinophils Relative: 34 %
HCT: 30.3 % — ABNORMAL LOW (ref 36.0–46.0)
Hemoglobin: 10 g/dL — ABNORMAL LOW (ref 12.0–15.0)
Immature Granulocytes: 1 %
Lymphocytes Relative: 29 %
Lymphs Abs: 0.6 10*3/uL — ABNORMAL LOW (ref 0.7–4.0)
MCH: 31.1 pg (ref 26.0–34.0)
MCHC: 33 g/dL (ref 30.0–36.0)
MCV: 94.1 fL (ref 80.0–100.0)
Monocytes Absolute: 0.2 10*3/uL (ref 0.1–1.0)
Monocytes Relative: 12 %
Neutro Abs: 0.5 10*3/uL — ABNORMAL LOW (ref 1.7–7.7)
Neutrophils Relative %: 22 %
Platelet Count: 343 10*3/uL (ref 150–400)
RBC: 3.22 MIL/uL — ABNORMAL LOW (ref 3.87–5.11)
RDW: 16.2 % — ABNORMAL HIGH (ref 11.5–15.5)
WBC Count: 2.1 10*3/uL — ABNORMAL LOW (ref 4.0–10.5)
nRBC: 0 % (ref 0.0–0.2)

## 2020-03-14 LAB — CMP (CANCER CENTER ONLY)
ALT: 6 U/L (ref 0–44)
AST: 7 U/L — ABNORMAL LOW (ref 15–41)
Albumin: 2.8 g/dL — ABNORMAL LOW (ref 3.5–5.0)
Alkaline Phosphatase: 50 U/L (ref 38–126)
Anion gap: 12 (ref 5–15)
BUN: 8 mg/dL (ref 8–23)
CO2: 26 mmol/L (ref 22–32)
Calcium: 9.9 mg/dL (ref 8.9–10.3)
Chloride: 103 mmol/L (ref 98–111)
Creatinine: 0.76 mg/dL (ref 0.44–1.00)
GFR, Est AFR Am: >60 mL/min (ref >60)
GFR, Estimated: >60 mL/min (ref >60)
Glucose, Bld: 91 mg/dL (ref 70–99)
Potassium: 3.3 mmol/L — ABNORMAL LOW (ref 3.5–5.1)
Sodium: 141 mmol/L (ref 135–145)
Total Bilirubin: 0.4 mg/dL (ref 0.3–1.2)
Total Protein: 7.2 g/dL (ref 6.5–8.1)

## 2020-03-14 LAB — PHOSPHORUS: Phosphorus: 4 mg/dL (ref 2.5–4.6)

## 2020-03-14 LAB — SAMPLE TO BLOOD BANK

## 2020-03-14 LAB — LACTATE DEHYDROGENASE: LDH: 153 U/L (ref 98–192)

## 2020-03-14 LAB — URIC ACID: Uric Acid, Serum: <2 mg/dL — ABNORMAL LOW (ref 2.5–7.1)

## 2020-03-14 MED ORDER — SODIUM CHLORIDE 0.9 % IV SOLN
10.0000 mg | Freq: Once | INTRAVENOUS | Status: AC
  Administered 2020-03-14: 10 mg via INTRAVENOUS
  Filled 2020-03-14: qty 10

## 2020-03-14 MED ORDER — PALONOSETRON HCL INJECTION 0.25 MG/5ML
INTRAVENOUS | Status: AC
  Filled 2020-03-14: qty 5

## 2020-03-14 MED ORDER — PALONOSETRON HCL INJECTION 0.25 MG/5ML
0.2500 mg | Freq: Once | INTRAVENOUS | Status: AC
  Administered 2020-03-14: 0.25 mg via INTRAVENOUS

## 2020-03-14 MED ORDER — SODIUM CHLORIDE 0.9% FLUSH
10.0000 mL | INTRAVENOUS | Status: DC | PRN
  Administered 2020-03-14: 10 mL via INTRAVENOUS
  Filled 2020-03-14: qty 10

## 2020-03-14 MED ORDER — SODIUM CHLORIDE 0.9% FLUSH
10.0000 mL | INTRAVENOUS | Status: DC | PRN
  Administered 2020-03-14: 10 mL
  Filled 2020-03-14: qty 10

## 2020-03-14 MED ORDER — SODIUM CHLORIDE 0.9 % IV SOLN
Freq: Once | INTRAVENOUS | Status: AC
  Filled 2020-03-14: qty 250

## 2020-03-14 MED ORDER — POTASSIUM CHLORIDE CRYS ER 20 MEQ PO TBCR: 40.0000 meq | EXTENDED_RELEASE_TABLET | Freq: Two times a day (BID) | ORAL | 0 refills | Status: DC

## 2020-03-14 MED ORDER — HEPARIN SOD (PORK) LOCK FLUSH 100 UNIT/ML IV SOLN
500.0000 [IU] | Freq: Once | INTRAVENOUS | Status: AC | PRN
  Administered 2020-03-14: 500 [IU]
  Filled 2020-03-14: qty 5

## 2020-03-14 MED ORDER — SODIUM CHLORIDE 0.9 % IV SOLN
75.0000 mg/m2 | Freq: Once | INTRAVENOUS | Status: AC
  Administered 2020-03-14: 135 mg via INTRAVENOUS
  Filled 2020-03-14: qty 13.5

## 2020-03-14 NOTE — Telephone Encounter (Signed)
Scheduled appt per 6/7 los.

## 2020-03-14 NOTE — Telephone Encounter (Signed)
-----   Message from Wyatt Portela, MD sent at 03/14/2020  9:42 AM EDT ----- Please let her know her K is low and needs to restart replacement. You can send a refill if needed.

## 2020-03-14 NOTE — Patient Instructions (Signed)
[facility] Discharge Instructions for Patients Receiving Chemotherapy  Today you received the following chemotherapy agents: azacitidine.  To help prevent nausea and vomiting after your treatment, we encourage you to take your nausea medication as directed.   If you develop nausea and vomiting that is not controlled by your nausea medication, call the clinic.   BELOW ARE SYMPTOMS THAT SHOULD BE REPORTED IMMEDIATELY:  *FEVER GREATER THAN 100.5 F  *CHILLS WITH OR WITHOUT FEVER  NAUSEA AND VOMITING THAT IS NOT CONTROLLED WITH YOUR NAUSEA MEDICATION  *UNUSUAL SHORTNESS OF BREATH  *UNUSUAL BRUISING OR BLEEDING  TENDERNESS IN MOUTH AND THROAT WITH OR WITHOUT PRESENCE OF ULCERS  *URINARY PROBLEMS  *BOWEL PROBLEMS  UNUSUAL RASH Items with * indicate a potential emergency and should be followed up as soon as possible.  Feel free to call the clinic should you have any questions or concerns. The clinic phone number is [phone].  Please show the CHEMO ALERT CARD at check-in to the Emergency Department and triage nurse.   

## 2020-03-14 NOTE — Progress Notes (Signed)
Received okay to treat from Dr. Alen Blew this morning for Breanna Perry of 0.5.

## 2020-03-14 NOTE — Telephone Encounter (Signed)
TC to infusion nurse Tim to inform Pt that potassium was low and per Dr Alen Blew to start taking potassium. Tim relayed message. Informed Pt that prescription will be sent to the pharmacy. Pt verbalized understanding.

## 2020-03-14 NOTE — Progress Notes (Signed)
Hematology and Oncology Follow Up Visit  Breanna Perry [id number] [birth year] 64 y.o. 03/14/2020 8:28 AM [name], MDSmith, [name], MD   Principle Diagnosis: 64 year old woman with high risk MDS presented with anemia in February 2021.  She was found to have IPSS-R score of 6 and 12 to 14% blasts, 5 q. minus deletion at the time of diagnosis.   Current therapy:   Supportive packed red cell transfusion.  Vidaza 75 mg per metered square IV started on January 18, 2020.  She is receiving 7 consecutive days every 4 weeks.  She is here for cycle 3 of therapy.    Interim History: [name] presents today for a follow-up visit.  Since the last visit, she reports no major changes in her health.  She has tolerated the last cycle of Vidaza without any new complaints.  She did have a vasovagal syncope on 2 separate occasions but did not have any trauma.  She did report episode of passing out but no headaches or seizures.  Her blood pressure medication has been reduced accordingly.  She denies any nausea, vomiting or abdominal pain.  She denies any fevers chills or sweats.  She denies any recent hospitalizations or illnesses.     Medications: Unchanged on review. Current Outpatient Medications  Medication Sig Dispense Refill  . allopurinol (ZYLOPRIM) 300 MG tablet TAKE 1 TABLET (300 MG TOTAL) BY MOUTH 2 (TWO) TIMES DAILY. 60 tablet 0  . ARIPiprazole (ABILIFY) 5 MG tablet 5 mg.    . aspirin EC 81 MG EC tablet Take 1 tablet (81 mg total) by mouth daily.    Marland Kitchen atorvastatin (LIPITOR) 40 MG tablet Take 1 tablet (40 mg total) by mouth daily at 6 PM. (Patient taking differently: Take 40 mg by mouth daily. ) 30 tablet 2  . betamethasone dipropionate 0.05 % cream Apply topically 2 (two) times daily. Recently started    . busPIRone (BUSPAR) 10 MG tablet Take 10 mg by mouth 2 (two) times daily.    . citalopram (CELEXA) 40 MG tablet Take 40 mg by mouth daily.   1  . colesevelam (WELCHOL) 625 MG tablet  Take 1,875 mg by mouth 2 (two) times daily.    . cyanocobalamin 1000 MCG tablet Take by mouth.    . EQ GENTLE LAXATIVE 5 MG EC tablet See admin instructions.    . Eszopiclone 3 MG TABS Take 3 mg by mouth at bedtime as needed (sleep).     . hydrocortisone 2.5 % cream Apply 1 application topically 2 (two) times daily. resently started    . lidocaine-prilocaine (EMLA) cream Apply 1 application topically as needed. 30 g 0  . Olmesartan-Amlodipine-HCTZ (TRIBENZOR) 40-10-25 MG TABS Take 1 tablet by mouth daily.     . pantoprazole (PROTONIX) 40 MG tablet Take 1 tablet (40 mg total) by mouth daily. 60 tablet 1  . polyethylene glycol-electrolytes (NULYTELY) 420 g solution See admin instructions.    . potassium chloride SA (KLOR-CON) 20 MEQ tablet Take 2 tablets (40 mEq total) by mouth 2 (two) times daily. 30 tablet 0  . prochlorperazine (COMPAZINE) 10 MG tablet Take 1 tablet (10 mg total) by mouth every 6 (six) hours as needed for nausea or vomiting. 30 tablet 0   No current facility-administered medications for this visit.   Facility-Administered Medications Ordered in Other Visits  Medication Dose Route Frequency Provider Last Rate Last Admin  . sodium chloride flush (NS) 0.9 % injection 10 mL  10 mL Intravenous PRN Wyatt Portela, MD  10 mL at 03/14/20 0815     Allergies: No Known Allergies    Physical Exam:   Blood pressure 110/70, pulse 79, temperature (!) 97.5 F (36.4 C), temperature source Temporal, resp. rate 18, height 5\' 3"  (1.6 m), weight 152 lb 9.6 oz (69.2 kg), SpO2 100 %.    ECOG: 1    General appearance: Alert, awake without any distress. Head: Atraumatic without abnormalities Oropharynx: Without any thrush or ulcers. Eyes: No scleral icterus. Lymph nodes: No lymphadenopathy noted in the cervical, supraclavicular, or axillary nodes Heart:regular rate and rhythm, without any murmurs or gallops.   Lung: Clear to auscultation without any rhonchi, wheezes or dullness to  percussion. Abdomin: Soft, nontender without any shifting dullness or ascites. Musculoskeletal: No clubbing or cyanosis. Neurological: No motor or sensory deficits. Skin: No rashes or lesions.        Lab Results: Lab Results  Component Value Date   WBC 2.4 (L) 03/08/2020   HGB 8.7 (L) 03/08/2020   HCT 26.5 (L) 03/08/2020   MCV 94.6 03/08/2020   PLT 301 03/08/2020     Chemistry      Component Value Date/Time   NA 141 03/08/2020 1145   NA 141 10/10/2018 1333   K 3.3 (L) 03/08/2020 1145   CL 106 03/08/2020 1145   CO2 25 03/08/2020 1145   BUN 11 03/08/2020 1145   BUN 12 10/10/2018 1333   CREATININE 0.74 03/08/2020 1145   CREATININE 1.01 03/29/2014 1428      Component Value Date/Time   CALCIUM 9.6 03/08/2020 1145   ALKPHOS 57 03/08/2020 1145   AST 9 (L) 03/08/2020 1145   ALT 8 03/08/2020 1145   BILITOT 0.7 03/08/2020 1145       Impression and Plan:   64 year old woman with:  1.  MDS diagnosed with high risk features, IPSS-R score is 6 with high blast count in February 2021.  She remains on Vidaza as outlined above without any major complications.  Risks and benefits of continuing this treatment were reviewed at this time.  Complications that include cytopenias, infusion related complications, infection and possibly sepsis.  The plan is to proceed with cycle 3 without any dose reduction or delay.  The plan is to repeat staging work-up after cycle 3 or cycle 4 pending her evaluation with Dr. Florene Glen at [facility] in the near future.  She is agreeable with this plan at this time.  2.  Anemia: Related to her MDS.  She is receiving transfusion support as needed.  Hemoglobin is adequate today and does not require transfusion  3.  IV access: Port-A-Cath currently in use without any issues or complications..  4.  Tumor lysis syndrome prophylaxis: No issues with tumor lysis at this time.  5.  Prognosis: Aggressive therapy is warranted given her  aggressive disease and reasonable performance status.  She will be for stem cell transplant after induction therapy.  6.  Antiemetics: No nausea or vomiting reported at this time.  7.  Hypokalemia: Her potassium will be checked today and replaced as needed.  8.  Syncope: Related to vasovagal component as well as hypotension and appears to have improved at this time.  9.  Follow-up: Daily to complete cycle 3 of therapy and in 4 weeks for the next cycle of therapy.  30  minutes were spent on this visit.  The time was dedicated to reviewing her disease status, reviewing laboratory data, discussing future care.    Zola Button, MD 6/7/20218:28  AM

## 2020-03-14 NOTE — Addendum Note (Signed)
Addended by: Raynelle Bring on: 03/14/2020 04:15 PM   Modules accepted: Miquel Dunn

## 2020-03-15 ENCOUNTER — Other Ambulatory Visit: Payer: Self-pay

## 2020-03-15 ENCOUNTER — Inpatient Hospital Stay: Payer: No Typology Code available for payment source

## 2020-03-15 VITALS — BP 115/77 | HR 79 | Temp 98.5°F | Resp 18

## 2020-03-15 DIAGNOSIS — D469 Myelodysplastic syndrome, unspecified: Secondary | ICD-10-CM

## 2020-03-15 DIAGNOSIS — Z5111 Encounter for antineoplastic chemotherapy: Secondary | ICD-10-CM | POA: Diagnosis not present

## 2020-03-15 MED ORDER — HEPARIN SOD (PORK) LOCK FLUSH 100 UNIT/ML IV SOLN
500.0000 [IU] | Freq: Once | INTRAVENOUS | Status: AC | PRN
  Administered 2020-03-15: 500 [IU]
  Filled 2020-03-15: qty 5

## 2020-03-15 MED ORDER — SODIUM CHLORIDE 0.9 % IV SOLN
Freq: Once | INTRAVENOUS | Status: AC
  Filled 2020-03-15: qty 250

## 2020-03-15 MED ORDER — SODIUM CHLORIDE 0.9 % IV SOLN
10.0000 mg | Freq: Once | INTRAVENOUS | Status: AC
  Administered 2020-03-15: 10 mg via INTRAVENOUS
  Filled 2020-03-15: qty 10

## 2020-03-15 MED ORDER — SODIUM CHLORIDE 0.9 % IV SOLN
75.0000 mg/m2 | Freq: Once | INTRAVENOUS | Status: AC
  Administered 2020-03-15: 135 mg via INTRAVENOUS
  Filled 2020-03-15: qty 13.5

## 2020-03-15 MED ORDER — SODIUM CHLORIDE 0.9% FLUSH
10.0000 mL | INTRAVENOUS | Status: DC | PRN
  Administered 2020-03-15: 10 mL
  Filled 2020-03-15: qty 10

## 2020-03-15 NOTE — Patient Instructions (Signed)
[facility] Discharge Instructions for Patients Receiving Chemotherapy  Today you received the following chemotherapy agents: azacitidine.  To help prevent nausea and vomiting after your treatment, we encourage you to take your nausea medication as directed.   If you develop nausea and vomiting that is not controlled by your nausea medication, call the clinic.   BELOW ARE SYMPTOMS THAT SHOULD BE REPORTED IMMEDIATELY:  *FEVER GREATER THAN 100.5 F  *CHILLS WITH OR WITHOUT FEVER  NAUSEA AND VOMITING THAT IS NOT CONTROLLED WITH YOUR NAUSEA MEDICATION  *UNUSUAL SHORTNESS OF BREATH  *UNUSUAL BRUISING OR BLEEDING  TENDERNESS IN MOUTH AND THROAT WITH OR WITHOUT PRESENCE OF ULCERS  *URINARY PROBLEMS  *BOWEL PROBLEMS  UNUSUAL RASH Items with * indicate a potential emergency and should be followed up as soon as possible.  Feel free to call the clinic should you have any questions or concerns. The clinic phone number is [phone].  Please show the CHEMO ALERT CARD at check-in to the Emergency Department and triage nurse.   

## 2020-03-16 ENCOUNTER — Inpatient Hospital Stay: Payer: No Typology Code available for payment source

## 2020-03-16 ENCOUNTER — Other Ambulatory Visit: Payer: Self-pay

## 2020-03-16 VITALS — BP 110/71 | HR 70 | Temp 98.2°F | Resp 18

## 2020-03-16 DIAGNOSIS — D469 Myelodysplastic syndrome, unspecified: Secondary | ICD-10-CM

## 2020-03-16 DIAGNOSIS — Z5111 Encounter for antineoplastic chemotherapy: Secondary | ICD-10-CM | POA: Diagnosis not present

## 2020-03-16 MED ORDER — PALONOSETRON HCL INJECTION 0.25 MG/5ML
0.2500 mg | Freq: Once | INTRAVENOUS | Status: AC
  Administered 2020-03-16: 0.25 mg via INTRAVENOUS

## 2020-03-16 MED ORDER — PALONOSETRON HCL INJECTION 0.25 MG/5ML
INTRAVENOUS | Status: AC
  Filled 2020-03-16: qty 5

## 2020-03-16 MED ORDER — SODIUM CHLORIDE 0.9 % IV SOLN
10.0000 mg | Freq: Once | INTRAVENOUS | Status: AC
  Administered 2020-03-16: 10 mg via INTRAVENOUS
  Filled 2020-03-16: qty 10

## 2020-03-16 MED ORDER — HEPARIN SOD (PORK) LOCK FLUSH 100 UNIT/ML IV SOLN
500.0000 [IU] | Freq: Once | INTRAVENOUS | Status: AC | PRN
  Administered 2020-03-16: 500 [IU]
  Filled 2020-03-16: qty 5

## 2020-03-16 MED ORDER — SODIUM CHLORIDE 0.9 % IV SOLN
Freq: Once | INTRAVENOUS | Status: AC
  Filled 2020-03-16: qty 250

## 2020-03-16 MED ORDER — SODIUM CHLORIDE 0.9% FLUSH
10.0000 mL | INTRAVENOUS | Status: DC | PRN
  Administered 2020-03-16: 10 mL
  Filled 2020-03-16: qty 10

## 2020-03-16 MED ORDER — SODIUM CHLORIDE 0.9 % IV SOLN
75.0000 mg/m2 | Freq: Once | INTRAVENOUS | Status: AC
  Administered 2020-03-16: 135 mg via INTRAVENOUS
  Filled 2020-03-16: qty 13.5

## 2020-03-16 NOTE — Patient Instructions (Signed)
[facility] Discharge Instructions for Patients Receiving Chemotherapy  Today you received the following chemotherapy agents: azacitidine.  To help prevent nausea and vomiting after your treatment, we encourage you to take your nausea medication as directed.   If you develop nausea and vomiting that is not controlled by your nausea medication, call the clinic.   BELOW ARE SYMPTOMS THAT SHOULD BE REPORTED IMMEDIATELY:  *FEVER GREATER THAN 100.5 F  *CHILLS WITH OR WITHOUT FEVER  NAUSEA AND VOMITING THAT IS NOT CONTROLLED WITH YOUR NAUSEA MEDICATION  *UNUSUAL SHORTNESS OF BREATH  *UNUSUAL BRUISING OR BLEEDING  TENDERNESS IN MOUTH AND THROAT WITH OR WITHOUT PRESENCE OF ULCERS  *URINARY PROBLEMS  *BOWEL PROBLEMS  UNUSUAL RASH Items with * indicate a potential emergency and should be followed up as soon as possible.  Feel free to call the clinic should you have any questions or concerns. The clinic phone number is [phone].  Please show the CHEMO ALERT CARD at check-in to the Emergency Department and triage nurse.   

## 2020-03-17 ENCOUNTER — Other Ambulatory Visit: Payer: Self-pay

## 2020-03-17 ENCOUNTER — Inpatient Hospital Stay: Payer: No Typology Code available for payment source

## 2020-03-17 VITALS — BP 121/79 | HR 71 | Temp 98.4°F | Resp 16 | Wt 154.0 lb

## 2020-03-17 DIAGNOSIS — Z5111 Encounter for antineoplastic chemotherapy: Secondary | ICD-10-CM | POA: Diagnosis not present

## 2020-03-17 DIAGNOSIS — D469 Myelodysplastic syndrome, unspecified: Secondary | ICD-10-CM

## 2020-03-17 MED ORDER — SODIUM CHLORIDE 0.9% FLUSH
10.0000 mL | INTRAVENOUS | Status: DC | PRN
  Administered 2020-03-17: 10 mL
  Filled 2020-03-17: qty 10

## 2020-03-17 MED ORDER — SODIUM CHLORIDE 0.9 % IV SOLN
10.0000 mg | Freq: Once | INTRAVENOUS | Status: AC
  Administered 2020-03-17: 10 mg via INTRAVENOUS
  Filled 2020-03-17: qty 10

## 2020-03-17 MED ORDER — SODIUM CHLORIDE 0.9 % IV SOLN
Freq: Once | INTRAVENOUS | Status: AC
  Filled 2020-03-17: qty 250

## 2020-03-17 MED ORDER — SODIUM CHLORIDE 0.9 % IV SOLN
75.0000 mg/m2 | Freq: Once | INTRAVENOUS | Status: AC
  Administered 2020-03-17: 135 mg via INTRAVENOUS
  Filled 2020-03-17: qty 13.5

## 2020-03-17 MED ORDER — HEPARIN SOD (PORK) LOCK FLUSH 100 UNIT/ML IV SOLN
500.0000 [IU] | Freq: Once | INTRAVENOUS | Status: AC | PRN
  Administered 2020-03-17: 500 [IU]
  Filled 2020-03-17: qty 5

## 2020-03-17 NOTE — Patient Instructions (Addendum)
[facility] Discharge Instructions for Patients Receiving Chemotherapy  Today you received the following chemotherapy agent: Azacitidine (Vidaza)  To help prevent nausea and vomiting after your treatment, we encourage you to take your nausea medication as directed by your MD.   If you develop nausea and vomiting that is not controlled by your nausea medication, call the clinic.   BELOW ARE SYMPTOMS THAT SHOULD BE REPORTED IMMEDIATELY:  *FEVER GREATER THAN 100.5 F  *CHILLS WITH OR WITHOUT FEVER  NAUSEA AND VOMITING THAT IS NOT CONTROLLED WITH YOUR NAUSEA MEDICATION  *UNUSUAL SHORTNESS OF BREATH  *UNUSUAL BRUISING OR BLEEDING  TENDERNESS IN MOUTH AND THROAT WITH OR WITHOUT PRESENCE OF ULCERS  *URINARY PROBLEMS  *BOWEL PROBLEMS  UNUSUAL RASH Items with * indicate a potential emergency and should be followed up as soon as possible.  Feel free to call the clinic should you have any questions or concerns. The clinic phone number is [phone].  Please show the CHEMO ALERT CARD at check-in to the Emergency Department and triage nurse.   

## 2020-03-17 NOTE — Progress Notes (Signed)
Per Dr. Alen Blew, ok to proceed with treatment with WBC 2.3 and ANC 0.5

## 2020-03-18 ENCOUNTER — Other Ambulatory Visit: Payer: Self-pay

## 2020-03-18 ENCOUNTER — Inpatient Hospital Stay: Payer: No Typology Code available for payment source

## 2020-03-18 VITALS — BP 125/79 | HR 72 | Temp 98.2°F | Resp 18 | Wt 151.8 lb

## 2020-03-18 DIAGNOSIS — D469 Myelodysplastic syndrome, unspecified: Secondary | ICD-10-CM

## 2020-03-18 DIAGNOSIS — Z5111 Encounter for antineoplastic chemotherapy: Secondary | ICD-10-CM | POA: Diagnosis not present

## 2020-03-18 MED ORDER — SODIUM CHLORIDE 0.9 % IV SOLN
75.0000 mg/m2 | Freq: Once | INTRAVENOUS | Status: AC
  Administered 2020-03-18: 135 mg via INTRAVENOUS
  Filled 2020-03-18: qty 13.5

## 2020-03-18 MED ORDER — SODIUM CHLORIDE 0.9% FLUSH
10.0000 mL | INTRAVENOUS | Status: DC | PRN
  Administered 2020-03-18: 10 mL
  Filled 2020-03-18: qty 10

## 2020-03-18 MED ORDER — PALONOSETRON HCL INJECTION 0.25 MG/5ML
0.2500 mg | Freq: Once | INTRAVENOUS | Status: AC
  Administered 2020-03-18: 0.25 mg via INTRAVENOUS

## 2020-03-18 MED ORDER — SODIUM CHLORIDE 0.9 % IV SOLN
Freq: Once | INTRAVENOUS | Status: AC
  Filled 2020-03-18: qty 250

## 2020-03-18 MED ORDER — HEPARIN SOD (PORK) LOCK FLUSH 100 UNIT/ML IV SOLN
500.0000 [IU] | Freq: Once | INTRAVENOUS | Status: AC | PRN
  Administered 2020-03-18: 500 [IU]
  Filled 2020-03-18: qty 5

## 2020-03-18 MED ORDER — SODIUM CHLORIDE 0.9 % IV SOLN
10.0000 mg | Freq: Once | INTRAVENOUS | Status: AC
  Administered 2020-03-18: 10 mg via INTRAVENOUS
  Filled 2020-03-18: qty 10

## 2020-03-18 NOTE — Patient Instructions (Signed)
[facility] Discharge Instructions for Patients Receiving Chemotherapy  Today you received the following chemotherapy agent: Azacitidine (Vidaza)  To help prevent nausea and vomiting after your treatment, we encourage you to take your nausea medication as directed by your MD.   If you develop nausea and vomiting that is not controlled by your nausea medication, call the clinic.   BELOW ARE SYMPTOMS THAT SHOULD BE REPORTED IMMEDIATELY:  *FEVER GREATER THAN 100.5 F  *CHILLS WITH OR WITHOUT FEVER  NAUSEA AND VOMITING THAT IS NOT CONTROLLED WITH YOUR NAUSEA MEDICATION  *UNUSUAL SHORTNESS OF BREATH  *UNUSUAL BRUISING OR BLEEDING  TENDERNESS IN MOUTH AND THROAT WITH OR WITHOUT PRESENCE OF ULCERS  *URINARY PROBLEMS  *BOWEL PROBLEMS  UNUSUAL RASH Items with * indicate a potential emergency and should be followed up as soon as possible.  Feel free to call the clinic should you have any questions or concerns. The clinic phone number is [phone].  Please show the CHEMO ALERT CARD at check-in to the Emergency Department and triage nurse.   

## 2020-03-21 ENCOUNTER — Inpatient Hospital Stay: Payer: No Typology Code available for payment source

## 2020-03-21 ENCOUNTER — Other Ambulatory Visit: Payer: Self-pay

## 2020-03-21 VITALS — BP 94/69 | HR 99 | Temp 98.3°F | Resp 18

## 2020-03-21 DIAGNOSIS — D469 Myelodysplastic syndrome, unspecified: Secondary | ICD-10-CM

## 2020-03-21 DIAGNOSIS — Z5111 Encounter for antineoplastic chemotherapy: Secondary | ICD-10-CM | POA: Diagnosis not present

## 2020-03-21 DIAGNOSIS — D649 Anemia, unspecified: Secondary | ICD-10-CM

## 2020-03-21 DIAGNOSIS — Z95828 Presence of other vascular implants and grafts: Secondary | ICD-10-CM

## 2020-03-21 LAB — CBC WITH DIFFERENTIAL (CANCER CENTER ONLY)
Abs Immature Granulocytes: 0.46 10*3/uL — ABNORMAL HIGH (ref 0.00–0.07)
Basophils Absolute: 0.1 10*3/uL (ref 0.0–0.1)
Basophils Relative: 1 %
Eosinophils Absolute: 0.2 10*3/uL (ref 0.0–0.5)
Eosinophils Relative: 3 %
HCT: 37.4 % (ref 36.0–46.0)
Hemoglobin: 12.1 g/dL (ref 12.0–15.0)
Immature Granulocytes: 5 %
Lymphocytes Relative: 37 %
Lymphs Abs: 3.2 10*3/uL (ref 0.7–4.0)
MCH: 30.6 pg (ref 26.0–34.0)
MCHC: 32.4 g/dL (ref 30.0–36.0)
MCV: 94.7 fL (ref 80.0–100.0)
Monocytes Absolute: 0.8 10*3/uL (ref 0.1–1.0)
Monocytes Relative: 9 %
Neutro Abs: 3.9 10*3/uL (ref 1.7–7.7)
Neutrophils Relative %: 45 %
Platelet Count: 369 10*3/uL (ref 150–400)
RBC: 3.95 MIL/uL (ref 3.87–5.11)
RDW: 16.6 % — ABNORMAL HIGH (ref 11.5–15.5)
WBC Count: 8.6 10*3/uL (ref 4.0–10.5)
nRBC: 0.5 % — ABNORMAL HIGH (ref 0.0–0.2)

## 2020-03-21 LAB — CMP (CANCER CENTER ONLY)
ALT: 9 U/L (ref 0–44)
AST: 9 U/L — ABNORMAL LOW (ref 15–41)
Albumin: 3.2 g/dL — ABNORMAL LOW (ref 3.5–5.0)
Alkaline Phosphatase: 78 U/L (ref 38–126)
Anion gap: 7 (ref 5–15)
BUN: 28 mg/dL — ABNORMAL HIGH (ref 8–23)
CO2: 26 mmol/L (ref 22–32)
Calcium: 9.8 mg/dL (ref 8.9–10.3)
Chloride: 105 mmol/L (ref 98–111)
Creatinine: 1.05 mg/dL — ABNORMAL HIGH (ref 0.44–1.00)
GFR, Est AFR Am: >60 mL/min (ref >60)
GFR, Estimated: 57 mL/min — ABNORMAL LOW (ref >60)
Glucose, Bld: 79 mg/dL (ref 70–99)
Potassium: 4.6 mmol/L (ref 3.5–5.1)
Sodium: 138 mmol/L (ref 135–145)
Total Bilirubin: 0.4 mg/dL (ref 0.3–1.2)
Total Protein: 7.1 g/dL (ref 6.5–8.1)

## 2020-03-21 LAB — SAMPLE TO BLOOD BANK

## 2020-03-21 LAB — LACTATE DEHYDROGENASE: LDH: 180 U/L (ref 98–192)

## 2020-03-21 MED ORDER — SODIUM CHLORIDE 0.9% FLUSH
10.0000 mL | Freq: Once | INTRAVENOUS | Status: AC
  Administered 2020-03-21: 10 mL
  Filled 2020-03-21: qty 10

## 2020-03-21 MED ORDER — SODIUM CHLORIDE 0.9 % IV SOLN
10.0000 mg | Freq: Once | INTRAVENOUS | Status: AC
  Administered 2020-03-21: 10 mg via INTRAVENOUS
  Filled 2020-03-21: qty 10

## 2020-03-21 MED ORDER — SODIUM CHLORIDE 0.9 % IV SOLN
Freq: Once | INTRAVENOUS | Status: AC
  Filled 2020-03-21: qty 250

## 2020-03-21 MED ORDER — PALONOSETRON HCL INJECTION 0.25 MG/5ML
0.2500 mg | Freq: Once | INTRAVENOUS | Status: AC
  Administered 2020-03-21: 0.25 mg via INTRAVENOUS

## 2020-03-21 MED ORDER — SODIUM CHLORIDE 0.9% FLUSH
10.0000 mL | INTRAVENOUS | Status: DC | PRN
  Administered 2020-03-21: 10 mL
  Filled 2020-03-21: qty 10

## 2020-03-21 MED ORDER — SODIUM CHLORIDE 0.9 % IV SOLN
75.0000 mg/m2 | Freq: Once | INTRAVENOUS | Status: AC
  Administered 2020-03-21: 135 mg via INTRAVENOUS
  Filled 2020-03-21: qty 13.5

## 2020-03-21 MED ORDER — HEPARIN SOD (PORK) LOCK FLUSH 100 UNIT/ML IV SOLN
500.0000 [IU] | Freq: Once | INTRAVENOUS | Status: AC | PRN
  Administered 2020-03-21: 500 [IU]
  Filled 2020-03-21: qty 5

## 2020-03-21 MED ORDER — PALONOSETRON HCL INJECTION 0.25 MG/5ML
INTRAVENOUS | Status: AC
  Filled 2020-03-21: qty 5

## 2020-03-21 NOTE — Patient Instructions (Signed)
[facility] Discharge Instructions for Patients Receiving Chemotherapy  Today you received the following chemotherapy agent: Azacitidine (Vidaza)  To help prevent nausea and vomiting after your treatment, we encourage you to take your nausea medication as directed by your MD.   If you develop nausea and vomiting that is not controlled by your nausea medication, call the clinic.   BELOW ARE SYMPTOMS THAT SHOULD BE REPORTED IMMEDIATELY:  *FEVER GREATER THAN 100.5 F  *CHILLS WITH OR WITHOUT FEVER  NAUSEA AND VOMITING THAT IS NOT CONTROLLED WITH YOUR NAUSEA MEDICATION  *UNUSUAL SHORTNESS OF BREATH  *UNUSUAL BRUISING OR BLEEDING  TENDERNESS IN MOUTH AND THROAT WITH OR WITHOUT PRESENCE OF ULCERS  *URINARY PROBLEMS  *BOWEL PROBLEMS  UNUSUAL RASH Items with * indicate a potential emergency and should be followed up as soon as possible.  Feel free to call the clinic should you have any questions or concerns. The clinic phone number is [phone].  Please show the CHEMO ALERT CARD at check-in to the Emergency Department and triage nurse.   

## 2020-03-22 ENCOUNTER — Other Ambulatory Visit: Payer: Self-pay

## 2020-03-22 ENCOUNTER — Inpatient Hospital Stay: Payer: No Typology Code available for payment source

## 2020-03-22 VITALS — BP 100/67 | HR 84 | Temp 98.3°F | Resp 18

## 2020-03-22 DIAGNOSIS — Z95828 Presence of other vascular implants and grafts: Secondary | ICD-10-CM

## 2020-03-22 DIAGNOSIS — D469 Myelodysplastic syndrome, unspecified: Secondary | ICD-10-CM

## 2020-03-22 DIAGNOSIS — D649 Anemia, unspecified: Secondary | ICD-10-CM

## 2020-03-22 DIAGNOSIS — Z5111 Encounter for antineoplastic chemotherapy: Secondary | ICD-10-CM | POA: Diagnosis not present

## 2020-03-22 LAB — CBC WITH DIFFERENTIAL (CANCER CENTER ONLY)
Abs Immature Granulocytes: 0.25 10*3/uL — ABNORMAL HIGH (ref 0.00–0.07)
Basophils Absolute: 0 10*3/uL (ref 0.0–0.1)
Basophils Relative: 0 %
Eosinophils Absolute: 0 10*3/uL (ref 0.0–0.5)
Eosinophils Relative: 0 %
HCT: 35.9 % — ABNORMAL LOW (ref 36.0–46.0)
Hemoglobin: 11.7 g/dL — ABNORMAL LOW (ref 12.0–15.0)
Immature Granulocytes: 3 %
Lymphocytes Relative: 13 %
Lymphs Abs: 1.1 10*3/uL (ref 0.7–4.0)
MCH: 30.2 pg (ref 26.0–34.0)
MCHC: 32.6 g/dL (ref 30.0–36.0)
MCV: 92.5 fL (ref 80.0–100.0)
Monocytes Absolute: 0.8 10*3/uL (ref 0.1–1.0)
Monocytes Relative: 9 %
Neutro Abs: 6.3 10*3/uL (ref 1.7–7.7)
Neutrophils Relative %: 75 %
Platelet Count: 351 10*3/uL (ref 150–400)
RBC: 3.88 MIL/uL (ref 3.87–5.11)
RDW: 16.6 % — ABNORMAL HIGH (ref 11.5–15.5)
WBC Count: 8.5 10*3/uL (ref 4.0–10.5)
nRBC: 0.2 % (ref 0.0–0.2)

## 2020-03-22 LAB — CMP (CANCER CENTER ONLY)
ALT: 10 U/L (ref 0–44)
AST: 9 U/L — ABNORMAL LOW (ref 15–41)
Albumin: 3.2 g/dL — ABNORMAL LOW (ref 3.5–5.0)
Alkaline Phosphatase: 75 U/L (ref 38–126)
Anion gap: 7 (ref 5–15)
BUN: 21 mg/dL (ref 8–23)
CO2: 24 mmol/L (ref 22–32)
Calcium: 10.2 mg/dL (ref 8.9–10.3)
Chloride: 105 mmol/L (ref 98–111)
Creatinine: 0.96 mg/dL (ref 0.44–1.00)
GFR, Est AFR Am: >60 mL/min (ref >60)
GFR, Estimated: >60 mL/min (ref >60)
Glucose, Bld: 87 mg/dL (ref 70–99)
Potassium: 4.8 mmol/L (ref 3.5–5.1)
Sodium: 136 mmol/L (ref 135–145)
Total Bilirubin: 0.6 mg/dL (ref 0.3–1.2)
Total Protein: 7.2 g/dL (ref 6.5–8.1)

## 2020-03-22 LAB — LACTATE DEHYDROGENASE: LDH: 152 U/L (ref 98–192)

## 2020-03-22 LAB — SAMPLE TO BLOOD BANK

## 2020-03-22 MED ORDER — HEPARIN SOD (PORK) LOCK FLUSH 100 UNIT/ML IV SOLN
500.0000 [IU] | Freq: Once | INTRAVENOUS | Status: AC | PRN
  Administered 2020-03-22: 500 [IU]
  Filled 2020-03-22: qty 5

## 2020-03-22 MED ORDER — SODIUM CHLORIDE 0.9 % IV SOLN
10.0000 mg | Freq: Once | INTRAVENOUS | Status: AC
  Administered 2020-03-22: 10 mg via INTRAVENOUS
  Filled 2020-03-22: qty 10

## 2020-03-22 MED ORDER — SODIUM CHLORIDE 0.9 % IV SOLN
Freq: Once | INTRAVENOUS | Status: AC
  Filled 2020-03-22: qty 250

## 2020-03-22 MED ORDER — SODIUM CHLORIDE 0.9% FLUSH
10.0000 mL | INTRAVENOUS | Status: DC | PRN
  Administered 2020-03-22: 10 mL
  Filled 2020-03-22: qty 10

## 2020-03-22 MED ORDER — PALONOSETRON HCL INJECTION 0.25 MG/5ML
0.2500 mg | Freq: Once | INTRAVENOUS | Status: AC
  Administered 2020-03-22: 0.25 mg via INTRAVENOUS

## 2020-03-22 MED ORDER — PALONOSETRON HCL INJECTION 0.25 MG/5ML
INTRAVENOUS | Status: AC
  Filled 2020-03-22: qty 5

## 2020-03-22 MED ORDER — SODIUM CHLORIDE 0.9 % IV SOLN
75.0000 mg/m2 | Freq: Once | INTRAVENOUS | Status: AC
  Administered 2020-03-22: 135 mg via INTRAVENOUS
  Filled 2020-03-22: qty 13.5

## 2020-03-22 MED ORDER — SODIUM CHLORIDE 0.9% FLUSH
10.0000 mL | Freq: Once | INTRAVENOUS | Status: AC
  Administered 2020-03-22: 10 mL
  Filled 2020-03-22: qty 10

## 2020-03-24 DIAGNOSIS — R5383 Other fatigue: Secondary | ICD-10-CM | POA: Diagnosis not present

## 2020-03-24 DIAGNOSIS — Z9221 Personal history of antineoplastic chemotherapy: Secondary | ICD-10-CM | POA: Diagnosis not present

## 2020-03-24 DIAGNOSIS — M25511 Pain in right shoulder: Secondary | ICD-10-CM | POA: Diagnosis not present

## 2020-03-24 DIAGNOSIS — D469 Myelodysplastic syndrome, unspecified: Secondary | ICD-10-CM | POA: Diagnosis not present

## 2020-03-24 DIAGNOSIS — I951 Orthostatic hypotension: Secondary | ICD-10-CM | POA: Diagnosis not present

## 2020-03-24 DIAGNOSIS — T451X5A Adverse effect of antineoplastic and immunosuppressive drugs, initial encounter: Secondary | ICD-10-CM | POA: Diagnosis not present

## 2020-03-25 DIAGNOSIS — Z95828 Presence of other vascular implants and grafts: Secondary | ICD-10-CM | POA: Diagnosis not present

## 2020-03-25 DIAGNOSIS — D46Z Other myelodysplastic syndromes: Secondary | ICD-10-CM | POA: Diagnosis not present

## 2020-03-25 DIAGNOSIS — Z79899 Other long term (current) drug therapy: Secondary | ICD-10-CM | POA: Diagnosis not present

## 2020-03-25 DIAGNOSIS — I10 Essential (primary) hypertension: Secondary | ICD-10-CM | POA: Diagnosis not present

## 2020-03-25 DIAGNOSIS — E785 Hyperlipidemia, unspecified: Secondary | ICD-10-CM | POA: Diagnosis not present

## 2020-03-25 DIAGNOSIS — Z8673 Personal history of transient ischemic attack (TIA), and cerebral infarction without residual deficits: Secondary | ICD-10-CM | POA: Diagnosis not present

## 2020-03-25 DIAGNOSIS — M0680 Other specified rheumatoid arthritis, unspecified site: Secondary | ICD-10-CM | POA: Diagnosis not present

## 2020-03-28 ENCOUNTER — Ambulatory Visit (INDEPENDENT_AMBULATORY_CARE_PROVIDER_SITE_OTHER): Payer: No Typology Code available for payment source | Admitting: *Deleted

## 2020-03-28 DIAGNOSIS — I639 Cerebral infarction, unspecified: Secondary | ICD-10-CM

## 2020-03-28 LAB — CUP PACEART REMOTE DEVICE CHECK
Date Time Interrogation Session: 20210621002322
Implantable Pulse Generator Implant Date: 20200114

## 2020-03-29 NOTE — Progress Notes (Signed)
[facility] Summary Report / Loop Recorder 

## 2020-04-01 DIAGNOSIS — H524 Presbyopia: Secondary | ICD-10-CM | POA: Diagnosis not present

## 2020-04-02 ENCOUNTER — Other Ambulatory Visit: Payer: Self-pay | Admitting: Oncology

## 2020-04-12 ENCOUNTER — Other Ambulatory Visit: Payer: Self-pay

## 2020-04-12 ENCOUNTER — Inpatient Hospital Stay: Payer: No Typology Code available for payment source

## 2020-04-12 ENCOUNTER — Encounter: Payer: Self-pay | Admitting: Nurse Practitioner

## 2020-04-12 ENCOUNTER — Inpatient Hospital Stay (HOSPITAL_BASED_OUTPATIENT_CLINIC_OR_DEPARTMENT_OTHER): Payer: No Typology Code available for payment source | Admitting: Nurse Practitioner

## 2020-04-12 ENCOUNTER — Inpatient Hospital Stay: Payer: No Typology Code available for payment source | Attending: Oncology

## 2020-04-12 VITALS — BP 143/88 | HR 80 | Temp 97.5°F | Resp 18 | Ht 63.0 in | Wt 159.0 lb

## 2020-04-12 DIAGNOSIS — D469 Myelodysplastic syndrome, unspecified: Secondary | ICD-10-CM

## 2020-04-12 DIAGNOSIS — Z5111 Encounter for antineoplastic chemotherapy: Secondary | ICD-10-CM | POA: Diagnosis present

## 2020-04-12 DIAGNOSIS — K5900 Constipation, unspecified: Secondary | ICD-10-CM | POA: Insufficient documentation

## 2020-04-12 DIAGNOSIS — R42 Dizziness and giddiness: Secondary | ICD-10-CM | POA: Diagnosis not present

## 2020-04-12 DIAGNOSIS — Z95828 Presence of other vascular implants and grafts: Secondary | ICD-10-CM

## 2020-04-12 DIAGNOSIS — Z79899 Other long term (current) drug therapy: Secondary | ICD-10-CM | POA: Insufficient documentation

## 2020-04-12 DIAGNOSIS — I10 Essential (primary) hypertension: Secondary | ICD-10-CM | POA: Insufficient documentation

## 2020-04-12 DIAGNOSIS — Z7982 Long term (current) use of aspirin: Secondary | ICD-10-CM | POA: Insufficient documentation

## 2020-04-12 DIAGNOSIS — D46C Myelodysplastic syndrome with isolated del(5q) chromosomal abnormality: Secondary | ICD-10-CM | POA: Diagnosis not present

## 2020-04-12 DIAGNOSIS — R110 Nausea: Secondary | ICD-10-CM | POA: Insufficient documentation

## 2020-04-12 DIAGNOSIS — D649 Anemia, unspecified: Secondary | ICD-10-CM

## 2020-04-12 LAB — CMP (CANCER CENTER ONLY)
ALT: 8 U/L (ref 0–44)
AST: 9 U/L — ABNORMAL LOW (ref 15–41)
Albumin: 3.2 g/dL — ABNORMAL LOW (ref 3.5–5.0)
Alkaline Phosphatase: 66 U/L (ref 38–126)
Anion gap: 7 (ref 5–15)
BUN: 7 mg/dL — ABNORMAL LOW (ref 8–23)
CO2: 25 mmol/L (ref 22–32)
Calcium: 9.7 mg/dL (ref 8.9–10.3)
Chloride: 110 mmol/L (ref 98–111)
Creatinine: 0.79 mg/dL (ref 0.44–1.00)
GFR, Est AFR Am: >60 mL/min (ref >60)
GFR, Estimated: >60 mL/min (ref >60)
Glucose, Bld: 82 mg/dL (ref 70–99)
Potassium: 3.6 mmol/L (ref 3.5–5.1)
Sodium: 142 mmol/L (ref 135–145)
Total Bilirubin: 0.7 mg/dL (ref 0.3–1.2)
Total Protein: 6.5 g/dL (ref 6.5–8.1)

## 2020-04-12 LAB — LACTATE DEHYDROGENASE: LDH: 363 U/L — ABNORMAL HIGH (ref 98–192)

## 2020-04-12 LAB — CBC WITH DIFFERENTIAL (CANCER CENTER ONLY)
Abs Immature Granulocytes: 0 10*3/uL (ref 0.00–0.07)
Basophils Absolute: 0.1 10*3/uL (ref 0.0–0.1)
Basophils Relative: 2 %
Eosinophils Absolute: 0.1 10*3/uL (ref 0.0–0.5)
Eosinophils Relative: 5 %
HCT: 31.4 % — ABNORMAL LOW (ref 36.0–46.0)
Hemoglobin: 10.1 g/dL — ABNORMAL LOW (ref 12.0–15.0)
Immature Granulocytes: 0 %
Lymphocytes Relative: 39 %
Lymphs Abs: 1 10*3/uL (ref 0.7–4.0)
MCH: 31 pg (ref 26.0–34.0)
MCHC: 32.2 g/dL (ref 30.0–36.0)
MCV: 96.3 fL (ref 80.0–100.0)
Monocytes Absolute: 0.1 10*3/uL (ref 0.1–1.0)
Monocytes Relative: 4 %
Neutro Abs: 1.2 10*3/uL — ABNORMAL LOW (ref 1.7–7.7)
Neutrophils Relative %: 50 %
Platelet Count: 182 10*3/uL (ref 150–400)
RBC: 3.26 MIL/uL — ABNORMAL LOW (ref 3.87–5.11)
RDW: 19 % — ABNORMAL HIGH (ref 11.5–15.5)
WBC Count: 2.4 10*3/uL — ABNORMAL LOW (ref 4.0–10.5)
nRBC: 0 % (ref 0.0–0.2)

## 2020-04-12 LAB — SAMPLE TO BLOOD BANK

## 2020-04-12 MED ORDER — SODIUM CHLORIDE 0.9 % IV SOLN
10.0000 mg | Freq: Once | INTRAVENOUS | Status: AC
  Administered 2020-04-12: 10 mg via INTRAVENOUS
  Filled 2020-04-12: qty 10

## 2020-04-12 MED ORDER — HEPARIN SOD (PORK) LOCK FLUSH 100 UNIT/ML IV SOLN
500.0000 [IU] | Freq: Once | INTRAVENOUS | Status: AC | PRN
  Administered 2020-04-12: 500 [IU]
  Filled 2020-04-12: qty 5

## 2020-04-12 MED ORDER — SODIUM CHLORIDE 0.9% FLUSH
10.0000 mL | Freq: Once | INTRAVENOUS | Status: AC
  Administered 2020-04-12: 10 mL
  Filled 2020-04-12: qty 10

## 2020-04-12 MED ORDER — PALONOSETRON HCL INJECTION 0.25 MG/5ML
INTRAVENOUS | Status: AC
  Filled 2020-04-12: qty 5

## 2020-04-12 MED ORDER — PALONOSETRON HCL INJECTION 0.25 MG/5ML
0.2500 mg | Freq: Once | INTRAVENOUS | Status: AC
  Administered 2020-04-12: 0.25 mg via INTRAVENOUS

## 2020-04-12 MED ORDER — SODIUM CHLORIDE 0.9 % IV SOLN
75.0000 mg/m2 | Freq: Once | INTRAVENOUS | Status: AC
  Administered 2020-04-12: 135 mg via INTRAVENOUS
  Filled 2020-04-12: qty 13.5

## 2020-04-12 MED ORDER — SODIUM CHLORIDE 0.9% FLUSH
10.0000 mL | INTRAVENOUS | Status: DC | PRN
  Administered 2020-04-12: 10 mL
  Filled 2020-04-12: qty 10

## 2020-04-12 MED ORDER — SODIUM CHLORIDE 0.9 % IV SOLN
Freq: Once | INTRAVENOUS | Status: AC
  Filled 2020-04-12: qty 250

## 2020-04-12 NOTE — Progress Notes (Signed)
  [facility] OFFICE PROGRESS NOTE   Diagnosis:  63 year old woman with high risk MDS presented with anemia in February 2021.  She was found to have IPSS-R score of 6 and 12 to 14% blasts, 5 q. minus deletion at the time of diagnosis.  INTERVAL HISTORY:   Ms. Jr returns as scheduled.  She completed cycle 3 days Vidaza beginning 03/14/2020 (7 days).  She denies nausea/vomiting.  No mouth sores.  No diarrhea.  Bowels moving regularly.  Appetite has improved.  She is gaining weight.  She denies fever.  No bleeding.  Objective:  Vital signs in last 24 hours:  Blood pressure (!) 143/88, pulse 80, temperature (!) 97.5 F (36.4 C), temperature source Temporal, resp. rate 18, height 5\' 3"  (1.6 m), weight 159 lb (72.1 kg), SpO2 100 %.    HEENT: Mild white coating of her tongue.  No buccal thrush. Resp: Lungs clear bilaterally. Cardio: Regular rate and rhythm. GI: Abdomen soft and nontender.  No hepatosplenomegaly. Vascular: No leg edema. Neuro: Alert and oriented. Port-A-Cath without erythema.  Lab Results:  Lab Results  Component Value Date   WBC 2.4 (L) 04/12/2020   HGB 10.1 (L) 04/12/2020   HCT 31.4 (L) 04/12/2020   MCV 96.3 04/12/2020   PLT 182 04/12/2020   NEUTROABS 1.2 (L) 04/12/2020    Imaging:  No results found.  Medications: I have reviewed the patient's current medications.  Assessment/Plan: 1. MDS diagnosed with high risk features, IPSS-R score 6 with high blast count in February 2021 currently on active treatment with Vidaza, 3 cycles completed to date. 2. Anemia secondary to #1. 3. Port-A-Cath. 4. Hypertension-currently off of antihypertensive medications due to syncope/hypotension.  Blood pressure mildly elevated today.  She will follow up with PCP.  Disposition: Ms. Morin appears stable.  She has completed 3 cycles of Vidaza.  She seems to be tolerating treatment well.  We reviewed the CBC and chemistry panel from today.  Labs are adequate  to proceed with treatment.  She has mild to moderate neutropenia.  She understands to contact the office with fever, chills, other signs of infection.  She has a follow-up appointment with Dr. Alen Blew on 04/18/2020.  She will contact the office in the interim as outlined above or with any other problems.    Ned Card ANP/GNP-BC   04/12/2020  11:06 AM

## 2020-04-12 NOTE — Patient Instructions (Signed)
[facility] Discharge Instructions for Patients Receiving Chemotherapy  Today you received the following chemotherapy agents Vidaza  To help prevent nausea and vomiting after your treatment, we encourage you to take your nausea medication as directed  If you develop nausea and vomiting that is not controlled by your nausea medication, call the clinic.   BELOW ARE SYMPTOMS THAT SHOULD BE REPORTED IMMEDIATELY:  *FEVER GREATER THAN 100.5 F  *CHILLS WITH OR WITHOUT FEVER  NAUSEA AND VOMITING THAT IS NOT CONTROLLED WITH YOUR NAUSEA MEDICATION  *UNUSUAL SHORTNESS OF BREATH  *UNUSUAL BRUISING OR BLEEDING  TENDERNESS IN MOUTH AND THROAT WITH OR WITHOUT PRESENCE OF ULCERS  *URINARY PROBLEMS  *BOWEL PROBLEMS  UNUSUAL RASH Items with * indicate a potential emergency and should be followed up as soon as possible.  Feel free to call the clinic should you have any questions or concerns. The clinic phone number is [phone].  Please show the CHEMO ALERT CARD at check-in to the Emergency Department and triage nurse.   

## 2020-04-12 NOTE — Progress Notes (Signed)
Per Ned Card, NP, ok to treat with today's labs.

## 2020-04-13 ENCOUNTER — Other Ambulatory Visit: Payer: Self-pay

## 2020-04-13 ENCOUNTER — Telehealth: Payer: Self-pay | Admitting: Nurse Practitioner

## 2020-04-13 ENCOUNTER — Inpatient Hospital Stay: Payer: No Typology Code available for payment source

## 2020-04-13 VITALS — BP 159/90 | HR 84 | Temp 98.5°F | Resp 16

## 2020-04-13 DIAGNOSIS — D469 Myelodysplastic syndrome, unspecified: Secondary | ICD-10-CM

## 2020-04-13 DIAGNOSIS — Z5111 Encounter for antineoplastic chemotherapy: Secondary | ICD-10-CM | POA: Diagnosis not present

## 2020-04-13 MED ORDER — SODIUM CHLORIDE 0.9 % IV SOLN
Freq: Once | INTRAVENOUS | Status: AC
  Filled 2020-04-13: qty 250

## 2020-04-13 MED ORDER — HEPARIN SOD (PORK) LOCK FLUSH 100 UNIT/ML IV SOLN
500.0000 [IU] | Freq: Once | INTRAVENOUS | Status: AC | PRN
  Administered 2020-04-13: 500 [IU]
  Filled 2020-04-13: qty 5

## 2020-04-13 MED ORDER — SODIUM CHLORIDE 0.9% FLUSH
10.0000 mL | INTRAVENOUS | Status: DC | PRN
  Administered 2020-04-13: 10 mL
  Filled 2020-04-13: qty 10

## 2020-04-13 MED ORDER — SODIUM CHLORIDE 0.9 % IV SOLN
10.0000 mg | Freq: Once | INTRAVENOUS | Status: AC
  Administered 2020-04-13: 10 mg via INTRAVENOUS
  Filled 2020-04-13: qty 10

## 2020-04-13 MED ORDER — SODIUM CHLORIDE 0.9 % IV SOLN
75.0000 mg/m2 | Freq: Once | INTRAVENOUS | Status: AC
  Administered 2020-04-13: 135 mg via INTRAVENOUS
  Filled 2020-04-13: qty 13.5

## 2020-04-13 NOTE — Patient Instructions (Signed)
[facility] Discharge Instructions for Patients Receiving Chemotherapy  Today you received the following chemotherapy agents: Azacitidine (Vidaza).  To help prevent nausea and vomiting after your treatment, we encourage you to take your nausea medication as prescribed.  If you develop nausea and vomiting that is not controlled by your nausea medication, call the clinic.   BELOW ARE SYMPTOMS THAT SHOULD BE REPORTED IMMEDIATELY:  *FEVER GREATER THAN 100.5 F  *CHILLS WITH OR WITHOUT FEVER  NAUSEA AND VOMITING THAT IS NOT CONTROLLED WITH YOUR NAUSEA MEDICATION  *UNUSUAL SHORTNESS OF BREATH  *UNUSUAL BRUISING OR BLEEDING  TENDERNESS IN MOUTH AND THROAT WITH OR WITHOUT PRESENCE OF ULCERS  *URINARY PROBLEMS  *BOWEL PROBLEMS  UNUSUAL RASH Items with * indicate a potential emergency and should be followed up as soon as possible.  Feel free to call the clinic should you have any questions or concerns. The clinic phone number is [phone].  Please show the CHEMO ALERT CARD at check-in to the Emergency Department and triage nurse.   

## 2020-04-13 NOTE — Telephone Encounter (Signed)
Per 7/6 los, no changes made to pt schedule

## 2020-04-14 ENCOUNTER — Inpatient Hospital Stay: Payer: No Typology Code available for payment source

## 2020-04-14 ENCOUNTER — Other Ambulatory Visit: Payer: Self-pay

## 2020-04-14 VITALS — BP 145/92 | HR 79 | Temp 98.8°F | Resp 17

## 2020-04-14 DIAGNOSIS — Z5111 Encounter for antineoplastic chemotherapy: Secondary | ICD-10-CM | POA: Diagnosis not present

## 2020-04-14 DIAGNOSIS — D469 Myelodysplastic syndrome, unspecified: Secondary | ICD-10-CM

## 2020-04-14 MED ORDER — SODIUM CHLORIDE 0.9 % IV SOLN
10.0000 mg | Freq: Once | INTRAVENOUS | Status: AC
  Administered 2020-04-14: 10 mg via INTRAVENOUS
  Filled 2020-04-14: qty 10

## 2020-04-14 MED ORDER — PALONOSETRON HCL INJECTION 0.25 MG/5ML
0.2500 mg | Freq: Once | INTRAVENOUS | Status: AC
  Administered 2020-04-14: 0.25 mg via INTRAVENOUS

## 2020-04-14 MED ORDER — SODIUM CHLORIDE 0.9% FLUSH
10.0000 mL | INTRAVENOUS | Status: DC | PRN
  Administered 2020-04-14: 10 mL
  Filled 2020-04-14: qty 10

## 2020-04-14 MED ORDER — SODIUM CHLORIDE 0.9 % IV SOLN
Freq: Once | INTRAVENOUS | Status: AC
  Filled 2020-04-14: qty 250

## 2020-04-14 MED ORDER — HEPARIN SOD (PORK) LOCK FLUSH 100 UNIT/ML IV SOLN
500.0000 [IU] | Freq: Once | INTRAVENOUS | Status: AC | PRN
  Administered 2020-04-14: 500 [IU]
  Filled 2020-04-14: qty 5

## 2020-04-14 MED ORDER — SODIUM CHLORIDE 0.9 % IV SOLN
75.0000 mg/m2 | Freq: Once | INTRAVENOUS | Status: AC
  Administered 2020-04-14: 135 mg via INTRAVENOUS
  Filled 2020-04-14: qty 13.5

## 2020-04-14 MED ORDER — PALONOSETRON HCL INJECTION 0.25 MG/5ML
INTRAVENOUS | Status: AC
  Filled 2020-04-14: qty 5

## 2020-04-15 ENCOUNTER — Other Ambulatory Visit: Payer: Self-pay

## 2020-04-15 ENCOUNTER — Inpatient Hospital Stay: Payer: No Typology Code available for payment source

## 2020-04-15 VITALS — BP 177/89 | HR 60 | Temp 98.6°F | Resp 18

## 2020-04-15 DIAGNOSIS — D469 Myelodysplastic syndrome, unspecified: Secondary | ICD-10-CM

## 2020-04-15 DIAGNOSIS — Z5111 Encounter for antineoplastic chemotherapy: Secondary | ICD-10-CM | POA: Diagnosis not present

## 2020-04-15 MED ORDER — SODIUM CHLORIDE 0.9 % IV SOLN
10.0000 mg | Freq: Once | INTRAVENOUS | Status: AC
  Administered 2020-04-15: 10 mg via INTRAVENOUS
  Filled 2020-04-15: qty 10

## 2020-04-15 MED ORDER — SODIUM CHLORIDE 0.9 % IV SOLN
75.0000 mg/m2 | Freq: Once | INTRAVENOUS | Status: AC
  Administered 2020-04-15: 135 mg via INTRAVENOUS
  Filled 2020-04-15: qty 13.5

## 2020-04-15 MED ORDER — HEPARIN SOD (PORK) LOCK FLUSH 100 UNIT/ML IV SOLN
500.0000 [IU] | Freq: Once | INTRAVENOUS | Status: AC | PRN
  Administered 2020-04-15: 500 [IU]
  Filled 2020-04-15: qty 5

## 2020-04-15 MED ORDER — SODIUM CHLORIDE 0.9% FLUSH
10.0000 mL | INTRAVENOUS | Status: DC | PRN
  Administered 2020-04-15: 10 mL
  Filled 2020-04-15: qty 10

## 2020-04-15 MED ORDER — SODIUM CHLORIDE 0.9 % IV SOLN
Freq: Once | INTRAVENOUS | Status: AC
  Filled 2020-04-15: qty 250

## 2020-04-15 NOTE — Patient Instructions (Signed)
[facility] Discharge Instructions for Patients Receiving Chemotherapy  Today you received the following chemotherapy agents Vidaza  To help prevent nausea and vomiting after your treatment, we encourage you to take your nausea medication as directed  If you develop nausea and vomiting that is not controlled by your nausea medication, call the clinic.   BELOW ARE SYMPTOMS THAT SHOULD BE REPORTED IMMEDIATELY:  *FEVER GREATER THAN 100.5 F  *CHILLS WITH OR WITHOUT FEVER  NAUSEA AND VOMITING THAT IS NOT CONTROLLED WITH YOUR NAUSEA MEDICATION  *UNUSUAL SHORTNESS OF BREATH  *UNUSUAL BRUISING OR BLEEDING  TENDERNESS IN MOUTH AND THROAT WITH OR WITHOUT PRESENCE OF ULCERS  *URINARY PROBLEMS  *BOWEL PROBLEMS  UNUSUAL RASH Items with * indicate a potential emergency and should be followed up as soon as possible.  Feel free to call the clinic should you have any questions or concerns. The clinic phone number is [phone].  Please show the CHEMO ALERT CARD at check-in to the Emergency Department and triage nurse.   

## 2020-04-18 ENCOUNTER — Inpatient Hospital Stay: Payer: No Typology Code available for payment source

## 2020-04-18 ENCOUNTER — Inpatient Hospital Stay (HOSPITAL_BASED_OUTPATIENT_CLINIC_OR_DEPARTMENT_OTHER): Payer: No Typology Code available for payment source | Admitting: Oncology

## 2020-04-18 ENCOUNTER — Other Ambulatory Visit: Payer: Self-pay

## 2020-04-18 VITALS — BP 151/91 | HR 73 | Temp 97.7°F | Resp 18 | Ht 63.0 in | Wt 157.6 lb

## 2020-04-18 DIAGNOSIS — Z5111 Encounter for antineoplastic chemotherapy: Secondary | ICD-10-CM | POA: Diagnosis not present

## 2020-04-18 DIAGNOSIS — D469 Myelodysplastic syndrome, unspecified: Secondary | ICD-10-CM

## 2020-04-18 DIAGNOSIS — Z95828 Presence of other vascular implants and grafts: Secondary | ICD-10-CM

## 2020-04-18 DIAGNOSIS — D649 Anemia, unspecified: Secondary | ICD-10-CM

## 2020-04-18 LAB — CBC WITH DIFFERENTIAL (CANCER CENTER ONLY)
Abs Immature Granulocytes: 0.04 10*3/uL (ref 0.00–0.07)
Basophils Absolute: 0.1 10*3/uL (ref 0.0–0.1)
Basophils Relative: 1 %
Eosinophils Absolute: 0.1 10*3/uL (ref 0.0–0.5)
Eosinophils Relative: 3 %
HCT: 35.4 % — ABNORMAL LOW (ref 36.0–46.0)
Hemoglobin: 11.8 g/dL — ABNORMAL LOW (ref 12.0–15.0)
Immature Granulocytes: 1 %
Lymphocytes Relative: 61 %
Lymphs Abs: 2.3 10*3/uL (ref 0.7–4.0)
MCH: 31.6 pg (ref 26.0–34.0)
MCHC: 33.3 g/dL (ref 30.0–36.0)
MCV: 94.9 fL (ref 80.0–100.0)
Monocytes Absolute: 0.2 10*3/uL (ref 0.1–1.0)
Monocytes Relative: 6 %
Neutro Abs: 1.1 10*3/uL — ABNORMAL LOW (ref 1.7–7.7)
Neutrophils Relative %: 28 %
Platelet Count: 218 10*3/uL (ref 150–400)
RBC: 3.73 MIL/uL — ABNORMAL LOW (ref 3.87–5.11)
RDW: 18.5 % — ABNORMAL HIGH (ref 11.5–15.5)
WBC Count: 3.8 10*3/uL — ABNORMAL LOW (ref 4.0–10.5)
nRBC: 1.3 % — ABNORMAL HIGH (ref 0.0–0.2)

## 2020-04-18 LAB — SAMPLE TO BLOOD BANK

## 2020-04-18 LAB — LACTATE DEHYDROGENASE: LDH: 152 U/L (ref 98–192)

## 2020-04-18 LAB — CMP (CANCER CENTER ONLY)
ALT: 18 U/L (ref 0–44)
AST: 12 U/L — ABNORMAL LOW (ref 15–41)
Albumin: 3.2 g/dL — ABNORMAL LOW (ref 3.5–5.0)
Alkaline Phosphatase: 74 U/L (ref 38–126)
Anion gap: 8 (ref 5–15)
BUN: 13 mg/dL (ref 8–23)
CO2: 27 mmol/L (ref 22–32)
Calcium: 9.4 mg/dL (ref 8.9–10.3)
Chloride: 106 mmol/L (ref 98–111)
Creatinine: 0.86 mg/dL (ref 0.44–1.00)
GFR, Est AFR Am: >60 mL/min (ref >60)
GFR, Estimated: >60 mL/min (ref >60)
Glucose, Bld: 77 mg/dL (ref 70–99)
Potassium: 3.3 mmol/L — ABNORMAL LOW (ref 3.5–5.1)
Sodium: 141 mmol/L (ref 135–145)
Total Bilirubin: 1.2 mg/dL (ref 0.3–1.2)
Total Protein: 6.3 g/dL — ABNORMAL LOW (ref 6.5–8.1)

## 2020-04-18 MED ORDER — PALONOSETRON HCL INJECTION 0.25 MG/5ML
0.2500 mg | Freq: Once | INTRAVENOUS | Status: AC
  Administered 2020-04-18: 0.25 mg via INTRAVENOUS

## 2020-04-18 MED ORDER — SODIUM CHLORIDE 0.9% FLUSH
10.0000 mL | Freq: Once | INTRAVENOUS | Status: AC
  Administered 2020-04-18: 10 mL
  Filled 2020-04-18: qty 10

## 2020-04-18 MED ORDER — SODIUM CHLORIDE 0.9 % IV SOLN
10.0000 mg | Freq: Once | INTRAVENOUS | Status: AC
  Administered 2020-04-18: 10 mg via INTRAVENOUS
  Filled 2020-04-18: qty 10

## 2020-04-18 MED ORDER — PALONOSETRON HCL INJECTION 0.25 MG/5ML
INTRAVENOUS | Status: AC
  Filled 2020-04-18: qty 5

## 2020-04-18 MED ORDER — SODIUM CHLORIDE 0.9 % IV SOLN
Freq: Once | INTRAVENOUS | Status: AC
  Filled 2020-04-18: qty 250

## 2020-04-18 MED ORDER — HEPARIN SOD (PORK) LOCK FLUSH 100 UNIT/ML IV SOLN
500.0000 [IU] | Freq: Once | INTRAVENOUS | Status: AC | PRN
  Administered 2020-04-18: 500 [IU]
  Filled 2020-04-18: qty 5

## 2020-04-18 MED ORDER — SODIUM CHLORIDE 0.9 % IV SOLN
75.0000 mg/m2 | Freq: Once | INTRAVENOUS | Status: AC
  Administered 2020-04-18: 135 mg via INTRAVENOUS
  Filled 2020-04-18: qty 13.5

## 2020-04-18 MED ORDER — SODIUM CHLORIDE 0.9% FLUSH
10.0000 mL | INTRAVENOUS | Status: DC | PRN
  Administered 2020-04-18: 10 mL
  Filled 2020-04-18: qty 10

## 2020-04-18 NOTE — Patient Instructions (Signed)
[facility] Discharge Instructions for Patients Receiving Chemotherapy  Today you received the following chemotherapy agents: azacitidine.  To help prevent nausea and vomiting after your treatment, we encourage you to take your nausea medication as directed.   If you develop nausea and vomiting that is not controlled by your nausea medication, call the clinic.   BELOW ARE SYMPTOMS THAT SHOULD BE REPORTED IMMEDIATELY:  *FEVER GREATER THAN 100.5 F  *CHILLS WITH OR WITHOUT FEVER  NAUSEA AND VOMITING THAT IS NOT CONTROLLED WITH YOUR NAUSEA MEDICATION  *UNUSUAL SHORTNESS OF BREATH  *UNUSUAL BRUISING OR BLEEDING  TENDERNESS IN MOUTH AND THROAT WITH OR WITHOUT PRESENCE OF ULCERS  *URINARY PROBLEMS  *BOWEL PROBLEMS  UNUSUAL RASH Items with * indicate a potential emergency and should be followed up as soon as possible.  Feel free to call the clinic should you have any questions or concerns. The clinic phone number is [phone].  Please show the CHEMO ALERT CARD at check-in to the Emergency Department and triage nurse.   

## 2020-04-18 NOTE — Progress Notes (Signed)
Hematology and Oncology Follow Up Visit  Breanna Perry [id number] Feb 19, 1956 64 y.o. 04/18/2020 8:52 AM Breanna Perry, Breanna Perry, Breanna Hope, MD   Principle Diagnosis: 65 year old woman with myelodysplastic syndrome diagnosed in February 2021.  She was found to have IPSS-R score of 6 and 12 to 14% blasts, 5 q. minus and worsening 18 the time of diagnosis.   Current therapy:   Supportive packed red cell transfusion.  She has not required any transfusion since May 2021.  Vidaza 75 mg per metered square IV started on January 18, 2020.  She is receiving 7 consecutive days every 4 weeks.  She is is currently receiving cycle 4 therapy which started on April 12, 2020.    Interim History: Breanna Perry returns today for a repeat evaluation.  Since the last visit, she reports no major changes in her health.  She has tolerated the beginning of cycle 4 of therapy without any new complications.  She is moving her bowels better at this time without any nausea or vomiting.  She denies any worsening neuropathy or fatigue.  She does report some dizziness and unsteadiness but still able to drive and attends to activities of daily living.     Medications: Reviewed without changes. Current Outpatient Medications  Medication Sig Dispense Refill  . allopurinol (ZYLOPRIM) 300 MG tablet TAKE 1 TABLET (300 MG TOTAL) BY MOUTH 2 (TWO) TIMES DAILY. 60 tablet 0  . ARIPiprazole (ABILIFY) 5 MG tablet 5 mg.    . aspirin EC 81 MG EC tablet Take 1 tablet (81 mg total) by mouth daily.    Marland Kitchen atorvastatin (LIPITOR) 40 MG tablet Take 1 tablet (40 mg total) by mouth daily at 6 PM. (Patient taking differently: Take 40 mg by mouth daily. ) 30 tablet 2  . betamethasone dipropionate 0.05 % cream Apply topically 2 (two) times daily. Recently started    . busPIRone (BUSPAR) 10 MG tablet Take 10 mg by mouth 2 (two) times daily.    . citalopram (CELEXA) 40 MG tablet Take 40 mg by mouth daily.   1  . colesevelam (WELCHOL) 625 MG tablet  Take 1,875 mg by mouth 2 (two) times daily.    . cyanocobalamin 1000 MCG tablet Take by mouth.    . EQ GENTLE LAXATIVE 5 MG EC tablet See admin instructions.    . Eszopiclone 3 MG TABS Take 3 mg by mouth at bedtime as needed (sleep).     . hydrocortisone 2.5 % cream Apply 1 application topically 2 (two) times daily. resently started    . lidocaine-prilocaine (EMLA) cream Apply 1 application topically as needed. 30 g 0  . metoprolol succinate (TOPROL-XL) 50 MG 24 hr tablet Take by mouth.    . Olmesartan-Amlodipine-HCTZ (TRIBENZOR) 40-10-25 MG TABS Take 1 tablet by mouth daily.     . pantoprazole (PROTONIX) 40 MG tablet Take 1 tablet (40 mg total) by mouth daily. 60 tablet 1  . polyethylene glycol-electrolytes (NULYTELY) 420 g solution See admin instructions.    . potassium chloride SA (KLOR-CON) 20 MEQ tablet Take 2 tablets (40 mEq total) by mouth 2 (two) times daily. 30 tablet 0  . prochlorperazine (COMPAZINE) 10 MG tablet Take 1 tablet (10 mg total) by mouth every 6 (six) hours as needed for nausea or vomiting. 30 tablet 0   No current facility-administered medications for this visit.     Allergies: No Known Allergies    Physical Exam:   Blood pressure (!) 151/91, pulse 73, temperature 97.7 F (36.5 C), temperature source  Temporal, resp. rate 18, height '5\' 3"'  (1.6 m), weight 157 lb 9.6 oz (71.5 kg), SpO2 100 %.     ECOG: 1    General appearance: Comfortable appearing without any discomfort Head: Normocephalic without any trauma Oropharynx: Mucous membranes are moist and pink without any thrush or ulcers. Eyes: Pupils are equal and round reactive to light. Lymph nodes: No cervical, supraclavicular, inguinal or axillary lymphadenopathy.   Heart:regular rate and rhythm.  S1 and S2 without leg edema. Lung: Clear without any rhonchi or wheezes.  No dullness to percussion. Abdomin: Soft, nontender, nondistended with good bowel sounds.  No hepatosplenomegaly. Musculoskeletal: No  joint deformity or effusion.  Full range of motion noted. Neurological: No deficits noted on motor, sensory and deep tendon reflex exam. Skin: No petechial rash or dryness.  Appeared moist.          Lab Results: Lab Results  Component Value Date   WBC 2.4 (L) 04/12/2020   HGB 10.1 (L) 04/12/2020   HCT 31.4 (L) 04/12/2020   MCV 96.3 04/12/2020   PLT 182 04/12/2020     Chemistry      Component Value Date/Time   NA 142 04/12/2020 0926   NA 141 10/10/2018 1333   K 3.6 04/12/2020 0926   CL 110 04/12/2020 0926   CO2 25 04/12/2020 0926   BUN 7 (L) 04/12/2020 0926   BUN 12 10/10/2018 1333   CREATININE 0.79 04/12/2020 0926   CREATININE 1.01 03/29/2014 1428      Component Value Date/Time   CALCIUM 9.7 04/12/2020 0926   ALKPHOS 66 04/12/2020 0926   AST 9 (L) 04/12/2020 0926   ALT 8 04/12/2020 0926   BILITOT 0.7 04/12/2020 0926       Impression and Plan:   64 year old woman with:  1.  High risk of myelodysplastic syndrome diagnosed in February 2021.  He presented with anemia, IPSS-R score is 6 with high blast count.  She is currently undergone cycle 4 of Vidaza without any major complications.  Risks and benefits of continuing this treatment were reviewed today.  She is experiencing some nausea and constipation that has been overall manageable.  The plan is to repeat bone marrow biopsy before the start of cycle 5 of therapy determine best course of action including potentially reducing her Vidaza to 5 days a week and future stem cell transplant consideration.  She is agreeable to proceed with this plan.  2.  Anemia: Her hemoglobin has been adequate without any need for transfusion.  3.  IV access: Port-A-Cath remains accessed without any issues.  4.  Constipation: Manageable at this time with the current bowel regimen.   5.  Prognosis: Therapy remains aggressive at this time given her overall performance status.  Her disease is potentially curable stem cell  transplant.  6.  Antiemetics: No recent exacerbation noted at this time.  Antiemetics are available to her.  7.  Hypokalemia: Corrected at this time.  Potassium has been repeated today and will replace as needed.  8.  Hypotension: Her blood pressure is back to normal range and mildly elevated today.  We will continue to monitor.  9.  Follow-up: She will return in 4 weeks for the start of cycle 5 of therapy.  30  minutes were dedicated to this encounter.  The time was spent on reviewing laboratory data, discussing disease status and discussing treatment options for the future.    Zola Button, MD 7/12/20218:52 AM

## 2020-04-19 ENCOUNTER — Inpatient Hospital Stay: Payer: No Typology Code available for payment source

## 2020-04-19 ENCOUNTER — Other Ambulatory Visit: Payer: Self-pay

## 2020-04-19 ENCOUNTER — Telehealth: Payer: Self-pay | Admitting: Oncology

## 2020-04-19 VITALS — BP 165/97 | HR 85 | Temp 98.6°F | Resp 18

## 2020-04-19 DIAGNOSIS — Z5111 Encounter for antineoplastic chemotherapy: Secondary | ICD-10-CM | POA: Diagnosis not present

## 2020-04-19 DIAGNOSIS — D469 Myelodysplastic syndrome, unspecified: Secondary | ICD-10-CM

## 2020-04-19 MED ORDER — SODIUM CHLORIDE 0.9 % IV SOLN
Freq: Once | INTRAVENOUS | Status: AC
  Filled 2020-04-19: qty 250

## 2020-04-19 MED ORDER — PALONOSETRON HCL INJECTION 0.25 MG/5ML
INTRAVENOUS | Status: AC
  Filled 2020-04-19: qty 5

## 2020-04-19 MED ORDER — SODIUM CHLORIDE 0.9% FLUSH
10.0000 mL | INTRAVENOUS | Status: DC | PRN
  Administered 2020-04-19: 10 mL
  Filled 2020-04-19: qty 10

## 2020-04-19 MED ORDER — SODIUM CHLORIDE 0.9 % IV SOLN
75.0000 mg/m2 | Freq: Once | INTRAVENOUS | Status: AC
  Administered 2020-04-19: 135 mg via INTRAVENOUS
  Filled 2020-04-19: qty 13.5

## 2020-04-19 MED ORDER — SODIUM CHLORIDE 0.9 % IV SOLN
10.0000 mg | Freq: Once | INTRAVENOUS | Status: AC
  Administered 2020-04-19: 10 mg via INTRAVENOUS
  Filled 2020-04-19: qty 10

## 2020-04-19 MED ORDER — HEPARIN SOD (PORK) LOCK FLUSH 100 UNIT/ML IV SOLN
500.0000 [IU] | Freq: Once | INTRAVENOUS | Status: AC | PRN
  Administered 2020-04-19: 500 [IU]
  Filled 2020-04-19: qty 5

## 2020-04-19 MED ORDER — PALONOSETRON HCL INJECTION 0.25 MG/5ML: 0.2500 mg | Freq: Once | INTRAVENOUS | Status: DC

## 2020-04-19 MED ORDER — POTASSIUM CHLORIDE CRYS ER 20 MEQ PO TBCR: 20.0000 meq | EXTENDED_RELEASE_TABLET | Freq: Two times a day (BID) | ORAL | 0 refills | Status: DC

## 2020-04-19 NOTE — Telephone Encounter (Signed)
Scheduled per 07/12 los, patient has been called and voicemail was left. 

## 2020-04-19 NOTE — Progress Notes (Signed)
Okay to treat with D8C4 Vidaza with ANC 1.1 per Dr. Alen Blew. Potassium 3.3 yesterday. Contacted Dr. Alen Blew and prescription refill sent in for potassium and patient aware.

## 2020-04-20 ENCOUNTER — Other Ambulatory Visit: Payer: Self-pay

## 2020-04-20 ENCOUNTER — Inpatient Hospital Stay: Payer: No Typology Code available for payment source

## 2020-04-20 VITALS — BP 168/99 | HR 72 | Temp 98.1°F | Resp 17

## 2020-04-20 DIAGNOSIS — D469 Myelodysplastic syndrome, unspecified: Secondary | ICD-10-CM

## 2020-04-20 DIAGNOSIS — Z5111 Encounter for antineoplastic chemotherapy: Secondary | ICD-10-CM | POA: Diagnosis not present

## 2020-04-20 MED ORDER — HEPARIN SOD (PORK) LOCK FLUSH 100 UNIT/ML IV SOLN
500.0000 [IU] | Freq: Once | INTRAVENOUS | Status: AC | PRN
  Administered 2020-04-20: 500 [IU]
  Filled 2020-04-20: qty 5

## 2020-04-20 MED ORDER — PALONOSETRON HCL INJECTION 0.25 MG/5ML
0.2500 mg | Freq: Once | INTRAVENOUS | Status: AC
  Administered 2020-04-20: 0.25 mg via INTRAVENOUS

## 2020-04-20 MED ORDER — SODIUM CHLORIDE 0.9 % IV SOLN
75.0000 mg/m2 | Freq: Once | INTRAVENOUS | Status: AC
  Administered 2020-04-20: 135 mg via INTRAVENOUS
  Filled 2020-04-20: qty 13.5

## 2020-04-20 MED ORDER — SODIUM CHLORIDE 0.9 % IV SOLN
Freq: Once | INTRAVENOUS | Status: AC
  Filled 2020-04-20: qty 250

## 2020-04-20 MED ORDER — SODIUM CHLORIDE 0.9 % IV SOLN
10.0000 mg | Freq: Once | INTRAVENOUS | Status: AC
  Administered 2020-04-20: 10 mg via INTRAVENOUS
  Filled 2020-04-20: qty 10

## 2020-04-20 MED ORDER — PALONOSETRON HCL INJECTION 0.25 MG/5ML
INTRAVENOUS | Status: AC
  Filled 2020-04-20: qty 5

## 2020-04-20 MED ORDER — SODIUM CHLORIDE 0.9% FLUSH
10.0000 mL | INTRAVENOUS | Status: DC | PRN
  Administered 2020-04-20: 10 mL
  Filled 2020-04-20: qty 10

## 2020-04-20 NOTE — Patient Instructions (Signed)
[facility] Discharge Instructions for Patients Receiving Chemotherapy  Today you received the following chemotherapy agents Vidaza  To help prevent nausea and vomiting after your treatment, we encourage you to take your nausea medication as directed  If you develop nausea and vomiting that is not controlled by your nausea medication, call the clinic.   BELOW ARE SYMPTOMS THAT SHOULD BE REPORTED IMMEDIATELY:  *FEVER GREATER THAN 100.5 F  *CHILLS WITH OR WITHOUT FEVER  NAUSEA AND VOMITING THAT IS NOT CONTROLLED WITH YOUR NAUSEA MEDICATION  *UNUSUAL SHORTNESS OF BREATH  *UNUSUAL BRUISING OR BLEEDING  TENDERNESS IN MOUTH AND THROAT WITH OR WITHOUT PRESENCE OF ULCERS  *URINARY PROBLEMS  *BOWEL PROBLEMS  UNUSUAL RASH Items with * indicate a potential emergency and should be followed up as soon as possible.  Feel free to call the clinic should you have any questions or concerns. The clinic phone number is [phone].  Please show the CHEMO ALERT CARD at check-in to the Emergency Department and triage nurse.   

## 2020-04-26 ENCOUNTER — Other Ambulatory Visit: Payer: Self-pay | Admitting: Oncology

## 2020-04-28 ENCOUNTER — Other Ambulatory Visit: Payer: Self-pay | Admitting: Oncology

## 2020-05-01 ENCOUNTER — Other Ambulatory Visit: Payer: Self-pay | Admitting: Radiology

## 2020-05-02 ENCOUNTER — Ambulatory Visit (INDEPENDENT_AMBULATORY_CARE_PROVIDER_SITE_OTHER): Payer: No Typology Code available for payment source | Admitting: *Deleted

## 2020-05-02 DIAGNOSIS — I639 Cerebral infarction, unspecified: Secondary | ICD-10-CM

## 2020-05-03 ENCOUNTER — Other Ambulatory Visit: Payer: Self-pay

## 2020-05-03 ENCOUNTER — Encounter (HOSPITAL_COMMUNITY): Payer: Self-pay

## 2020-05-03 ENCOUNTER — Ambulatory Visit (HOSPITAL_COMMUNITY)
Admission: RE | Admit: 2020-05-03 | Discharge: 2020-05-03 | Disposition: A | Discharge status: Home / Self Care | Payer: No Typology Code available for payment source | Source: Ambulatory Visit | Attending: Oncology | Admitting: Oncology

## 2020-05-03 DIAGNOSIS — D469 Myelodysplastic syndrome, unspecified: Secondary | ICD-10-CM

## 2020-05-03 DIAGNOSIS — Z7982 Long term (current) use of aspirin: Secondary | ICD-10-CM | POA: Insufficient documentation

## 2020-05-03 DIAGNOSIS — Z8673 Personal history of transient ischemic attack (TIA), and cerebral infarction without residual deficits: Secondary | ICD-10-CM | POA: Insufficient documentation

## 2020-05-03 DIAGNOSIS — M3500 Sicca syndrome, unspecified: Secondary | ICD-10-CM | POA: Insufficient documentation

## 2020-05-03 DIAGNOSIS — I10 Essential (primary) hypertension: Secondary | ICD-10-CM | POA: Insufficient documentation

## 2020-05-03 DIAGNOSIS — Z79899 Other long term (current) drug therapy: Secondary | ICD-10-CM | POA: Insufficient documentation

## 2020-05-03 DIAGNOSIS — Z8249 Family history of ischemic heart disease and other diseases of the circulatory system: Secondary | ICD-10-CM | POA: Diagnosis not present

## 2020-05-03 DIAGNOSIS — D61818 Other pancytopenia: Secondary | ICD-10-CM | POA: Diagnosis not present

## 2020-05-03 DIAGNOSIS — F418 Other specified anxiety disorders: Secondary | ICD-10-CM | POA: Diagnosis not present

## 2020-05-03 DIAGNOSIS — Z95828 Presence of other vascular implants and grafts: Secondary | ICD-10-CM | POA: Diagnosis not present

## 2020-05-03 DIAGNOSIS — Z95818 Presence of other cardiac implants and grafts: Secondary | ICD-10-CM | POA: Insufficient documentation

## 2020-05-03 DIAGNOSIS — Z87891 Personal history of nicotine dependence: Secondary | ICD-10-CM | POA: Insufficient documentation

## 2020-05-03 DIAGNOSIS — E883 Tumor lysis syndrome: Secondary | ICD-10-CM | POA: Insufficient documentation

## 2020-05-03 DIAGNOSIS — Z803 Family history of malignant neoplasm of breast: Secondary | ICD-10-CM | POA: Insufficient documentation

## 2020-05-03 LAB — CBC WITH DIFFERENTIAL/PLATELET
Abs Immature Granulocytes: 0.01 10*3/uL (ref 0.00–0.07)
Basophils Absolute: 0 10*3/uL (ref 0.0–0.1)
Basophils Relative: 1 %
Eosinophils Absolute: 0.1 10*3/uL (ref 0.0–0.5)
Eosinophils Relative: 5 %
HCT: 33 % — ABNORMAL LOW (ref 36.0–46.0)
Hemoglobin: 10.5 g/dL — ABNORMAL LOW (ref 12.0–15.0)
Immature Granulocytes: 1 %
Lymphocytes Relative: 84 %
Lymphs Abs: 0.9 10*3/uL (ref 0.7–4.0)
MCH: 32.3 pg (ref 26.0–34.0)
MCHC: 31.8 g/dL (ref 30.0–36.0)
MCV: 101.5 fL — ABNORMAL HIGH (ref 80.0–100.0)
Monocytes Absolute: 0 10*3/uL — ABNORMAL LOW (ref 0.1–1.0)
Monocytes Relative: 4 %
Neutro Abs: 0.1 10*3/uL — ABNORMAL LOW (ref 1.7–7.7)
Neutrophils Relative %: 5 %
Platelets: 80 10*3/uL — ABNORMAL LOW (ref 150–400)
RBC: 3.25 MIL/uL — ABNORMAL LOW (ref 3.87–5.11)
RDW: 19 % — ABNORMAL HIGH (ref 11.5–15.5)
WBC: 1 10*3/uL — CL (ref 4.0–10.5)
nRBC: 0 % (ref 0.0–0.2)

## 2020-05-03 LAB — CUP PACEART REMOTE DEVICE CHECK
Date Time Interrogation Session: 20210725233223
Implantable Pulse Generator Implant Date: 20200114

## 2020-05-03 MED ORDER — FENTANYL CITRATE (PF) 100 MCG/2ML IJ SOLN
INTRAMUSCULAR | Status: AC
  Filled 2020-05-03: qty 2

## 2020-05-03 MED ORDER — MIDAZOLAM HCL 2 MG/2ML IJ SOLN
INTRAMUSCULAR | Status: AC | PRN
  Administered 2020-05-03 (×2): 1 mg via INTRAVENOUS

## 2020-05-03 MED ORDER — LIDOCAINE HCL (PF) 1 % IJ SOLN
INTRAMUSCULAR | Status: AC | PRN
  Administered 2020-05-03: 10 mL via INTRADERMAL

## 2020-05-03 MED ORDER — SODIUM CHLORIDE 0.9 % IV SOLN: INTRAVENOUS | Status: DC

## 2020-05-03 MED ORDER — FENTANYL CITRATE (PF) 100 MCG/2ML IJ SOLN
INTRAMUSCULAR | Status: AC | PRN
  Administered 2020-05-03 (×2): 50 ug via INTRAVENOUS

## 2020-05-03 MED ORDER — FLUMAZENIL 0.5 MG/5ML IV SOLN
INTRAVENOUS | Status: AC
  Filled 2020-05-03: qty 5

## 2020-05-03 MED ORDER — NALOXONE HCL 0.4 MG/ML IJ SOLN
INTRAMUSCULAR | Status: AC
  Filled 2020-05-03: qty 1

## 2020-05-03 MED ORDER — MIDAZOLAM HCL 2 MG/2ML IJ SOLN
INTRAMUSCULAR | Status: AC
  Filled 2020-05-03: qty 2

## 2020-05-03 NOTE — Progress Notes (Signed)
Jamie- NP-IR- notified of WBC of 1.0.

## 2020-05-03 NOTE — Procedures (Signed)
Interventional Radiology Procedure Note  Procedure: CT guided aspirate and core biopsy of right iliac bone Complications: None Recommendations: - Bedrest supine x 1 hrs - Hydrocodone PRN  Pain - Follow biopsy results  Signed,  Akhil Piscopo K. Emili Mcloughlin, MD   

## 2020-05-03 NOTE — Discharge Instructions (Addendum)
Bone Marrow Aspiration and Bone Marrow Biopsy, Adult, Care After This sheet gives you information about how to care for yourself after your procedure. Your health care provider may also give you more specific instructions. If you have problems or questions, contact your health care provider. What can I expect after the procedure? After the procedure, it is common to have:  Mild pain and tenderness.  Swelling.  Bruising. Follow these instructions at home: Puncture site care   Follow instructions from your health care provider about how to take care of the puncture site. Make sure you: ? Wash your hands with soap and water before and after you change your bandage (dressing). If soap and water are not available, use hand sanitizer. ? Remove band aid to lower back 05/04/20 at at 10 AM and shower.  Check your puncture site every day for signs of infection. Check for: ? More redness, swelling, or pain. ? Fluid or blood. ? Warmth. ? Pus or a bad smell. Activity  Return to your normal activities as told by your health care provider. Ask your health care provider what activities are safe for you.  Do not lift anything that is heavier than 10 lb (4.5 kg), or the limit that you are told, until your health care provider says that it is safe.  Do not drive for 24 hours if you were given a sedative during your procedure. General instructions   Take over-the-counter and prescription medicines only as told by your health care provider.  Do not take baths, swim, or use a hot tub until your health care provider approves. Ask your health care provider if you may take showers. You may only be allowed to take sponge baths.  If directed, put ice on the affected area. To do this: ? Put ice in a plastic bag. ? Place a towel between your skin and the bag. ? Leave the ice on for 20 minutes, 2-3 times a day.  Keep all follow-up visits as told by your health care provider. This is important. Contact a  health care provider if:  Your pain is not controlled with medicine.  You have a fever.  You have more redness, swelling, or pain around the puncture site.  You have fluid or blood coming from the puncture site.  Your puncture site feels warm to the touch.  You have pus or a bad smell coming from the puncture site. Summary  After the procedure, it is common to have mild pain, tenderness, swelling, and bruising.  Follow instructions from your health care provider about how to take care of the puncture site and what activities are safe for you.  Take over-the-counter and prescription medicines only as told by your health care provider.  Contact a health care provider if you have any signs of infection, such as fluid or blood coming from the puncture site. This information is not intended to replace advice given to you by your health care provider. Make sure you discuss any questions you have with your health care provider. Document Revised: 02/10/2019 Document Reviewed: 02/10/2019 Elsevier Patient Education  [place].    Moderate Conscious Sedation, Adult, Care After These instructions provide you with information about caring for yourself after your procedure. Your health care provider may also give you more specific instructions. Your treatment has been planned according to current medical practices, but problems sometimes occur. Call your health care provider if you have any problems or questions after your procedure. What can I expect  after the procedure? After your procedure, it is common:  To feel sleepy for several hours.  To feel clumsy and have poor balance for several hours.  To have poor judgment for several hours.  To vomit if you eat too soon. Follow these instructions at home: For at least 24 hours after the procedure:   Do not: ? Participate in activities where you could fall or become injured. ? Drive. ? Use heavy machinery. ? Drink  alcohol. ? Take sleeping pills or medicines that cause drowsiness. ? Make important decisions or sign legal documents. ? Take care of children on your own.  Rest. Eating and drinking  Follow the diet recommended by your health care provider.  If you vomit: ? Drink water, juice, or soup when you can drink without vomiting. ? Make sure you have little or no nausea before eating solid foods. General instructions  Have a responsible adult stay with you until you are awake and alert.  Take over-the-counter and prescription medicines only as told by your health care provider.  If you smoke, do not smoke without supervision.  Keep all follow-up visits as told by your health care provider. This is important. Contact a health care provider if:  You keep feeling nauseous or you keep vomiting.  You feel light-headed.  You develop a rash.  You have a fever. Get help right away if:  You have trouble breathing. This information is not intended to replace advice given to you by your health care provider. Make sure you discuss any questions you have with your health care provider. Document Revised: 09/06/2017 Document Reviewed: 01/14/2016 Elsevier Patient Education  2020 [employer].

## 2020-05-03 NOTE — H&P (Signed)
 Chief Complaint: Patient was seen in consultation today for image-guided bone marrow biopsy and aspiration.   Referring Physician(s): Shadad, Firas N  Supervising Physician: McCullough, Heath  Patient Status: [facility] - In-pt  History of Present Illness: Breanna Perry is a 63 y.o. female with a medical history that includes a right MCA stroke (2019; loop recorder placed 2020), hypertension, anxiety/depression, Sjogren's syndrome, tumor lysis syndrome and MDS, diagnosed 11/2019. She has been receiving chemotherapy and blood transfusions since that time via a port-a-cath placed by IR 01/19/20.    Interventional Radiology has been asked to evaluate this patient for an image-guided bone marrow biopsy and aspiration. Her treatment plans include a possible stem cell transplant.  Her last BM biopsy was done in IR 11/30/19.   Past Medical History:  Diagnosis Date  . Anemia   . Anxiety   . Dental bridge present    upper and lower  . Depression   . Hypertension    under control with med., has been on med. x 8 yr.  . Leukocytopenia   . Medial meniscus tear 12/2012   left  . Sjogren's syndrome (HCC)   . Stroke (HCC)   . Wears glasses    reading    Past Surgical History:  Procedure Laterality Date  . APPENDECTOMY  04/1991  . BIOPSY  11/20/2019   Procedure: BIOPSY;  Surgeon: Karki, Arya, MD;  Location: [facility];  Service: Gastroenterology;;  . COLONOSCOPY    . COLONOSCOPY WITH PROPOFOL N/A 11/20/2019   Procedure: COLONOSCOPY WITH PROPOFOL;  Surgeon: Karki, Arya, MD;  Location: [facility];  Service: Gastroenterology;  Laterality: N/A;  . ESOPHAGOGASTRODUODENOSCOPY (EGD) WITH PROPOFOL N/A 11/20/2019   Procedure: ESOPHAGOGASTRODUODENOSCOPY (EGD) WITH PROPOFOL;  Surgeon: Karki, Arya, MD;  Location: [facility];  Service: Gastroenterology;  Laterality: N/A;  . IR IMAGING GUIDED PORT INSERTION  01/19/2020  . KNEE ARTHROSCOPY Left 01/07/2013   Procedure: LEFT KNEE ARTHROSCOPY PARTIAL MEDIAL  MENISECTOMY CHRONDOPLASTY MEDIAL PLICA RESECTION;  Surgeon: John L Graves, MD;  Location: [facility];  Service: Orthopedics;  Laterality: Left;  . KNEE SURGERY Left 06/22/2015  . LIVER BIOPSY  summer 2011  . LOOP RECORDER INSERTION N/A 10/21/2018   Procedure: LOOP RECORDER INSERTION;  Surgeon: Allred, James, MD;  Location: [facility];  Service: Cardiovascular;  Laterality: N/A;  . LUNG BIOPSY  summer 2011  . PARTIAL HYSTERECTOMY  04/1991   partial  . TEE WITHOUT CARDIOVERSION N/A 10/21/2018   Procedure: TRANSESOPHAGEAL ECHOCARDIOGRAM (TEE);  Surgeon: Acharya, Gayatri A, MD;  Location: [facility];  Service: Cardiology;  Laterality: N/A;  possible loop recorder    Allergies: Patient has no known allergies.  Medications: Prior to Admission medications   Medication Sig Start Date End Date Taking? Authorizing Provider  allopurinol (ZYLOPRIM) 300 MG tablet TAKE 1 TABLET (300 MG TOTAL) BY MOUTH 2 (TWO) TIMES DAILY. 04/28/20   Shadad, Firas N, MD  ARIPiprazole (ABILIFY) 5 MG tablet 5 mg. 04/02/18   [provider]  aspirin EC 81 MG EC tablet Take 1 tablet (81 mg total) by mouth daily. 10/01/18   Biby, Sharon L, NP  atorvastatin (LIPITOR) 40 MG tablet Take 1 tablet (40 mg total) by mouth daily at 6 PM. Patient taking differently: Take 40 mg by mouth daily.  09/30/18   Biby, Sharon L, NP  betamethasone dipropionate 0.05 % cream Apply topically 2 (two) times daily. Recently started Patient not taking: Reported on 04/18/2020    [provider]  busPIRone (BUSPAR) 10   MG tablet Take 10 mg by mouth 2 (two) times daily. 01/01/20   [provider]  citalopram (CELEXA) 40 MG tablet Take 40 mg by mouth daily.  04/09/17   [provider]  colesevelam (WELCHOL) 625 MG tablet Take 1,875 mg by mouth 2 (two) times daily.    [provider]  EQ GENTLE LAXATIVE 5 MG EC tablet See admin instructions. Patient not taking: Reported on 04/18/2020 11/18/19    [provider]  Eszopiclone 3 MG TABS Take 3 mg by mouth at bedtime as needed (sleep).  10/02/19   [provider]  hydrocortisone 2.5 % cream Apply 1 application topically 2 (two) times daily. resently started Patient not taking: Reported on 04/18/2020    [provider]  KLOR-CON M20 20 MEQ tablet TAKE 1 TABLET BY MOUTH TWICE A DAY 04/26/20   Shadad, Firas N, MD  lidocaine-prilocaine (EMLA) cream Apply 1 application topically as needed. 01/04/20   Shadad, Firas N, MD  metoprolol succinate (TOPROL-XL) 50 MG 24 hr tablet Take 25 mg by mouth.  04/13/20   [provider]  pantoprazole (PROTONIX) 40 MG tablet Take 1 tablet (40 mg total) by mouth daily. 11/20/19 11/19/20  Karki, Arya, MD  prochlorperazine (COMPAZINE) 10 MG tablet Take 1 tablet (10 mg total) by mouth every 6 (six) hours as needed for nausea or vomiting. Patient not taking: Reported on 04/18/2020 01/04/20   Shadad, Firas N, MD     Family History  Problem Relation Age of Onset  . Hypertension Father   . Emphysema Father   . Heart attack Father   . Cirrhosis Father   . Coronary artery disease Mother   . Heart failure Mother   . COPD Mother   . Breast cancer Maternal Grandmother   . HIV Brother   . Diabetes Sister     Social History   Socioeconomic History  . Marital status: Single    Spouse name: Not on file  . Number of children: N  . Years of education: Not on file  . Highest education level: Not on file  Occupational History  . Occupation: parts specialist [employer]   Tobacco Use  . Smoking status: Former Smoker    Packs/day: 1.00    Years: 31.00    Pack years: 31.00    Types: Cigarettes    Quit date: 10/08/2000    Years since quitting: 19.5  . Smokeless tobacco: Never Used  Vaping Use  . Vaping Use: Never used  Substance and Sexual Activity  . Alcohol use: Not Currently    Comment: rarely  . Drug use: No  . Sexual activity: Not on file  Other Topics Concern  . Not on  file  Social History Narrative  . Not on file   Social Determinants of Health   Financial Resource Strain:   . Difficulty of Paying Living Expenses:   Food Insecurity:   . Worried About Running Out of Food in the Last Year:   . Ran Out of Food in the Last Year:   Transportation Needs:   . Lack of Transportation (Medical):   . Lack of Transportation (Non-Medical):   Physical Activity:   . Days of Exercise per Week:   . Minutes of Exercise per Session:   Stress:   . Feeling of Stress :   Social Connections:   . Frequency of Communication with Friends and Family:   . Frequency of Social Gatherings with Friends and Family:   . Attends Religious   Services:   . Active Member of Clubs or Organizations:   . Attends Archivist Meetings:   Marland Kitchen Marital Status:     Review of Systems: A 12 point ROS discussed and pertinent positives are indicated in the HPI above.  All other systems are negative.  Review of Systems  Constitutional: Negative for appetite change, fatigue and fever.  Respiratory: Negative for cough and shortness of breath.   Cardiovascular: Negative for chest pain and leg swelling.  Gastrointestinal: Negative for abdominal distention, abdominal pain, diarrhea, nausea and vomiting.  Musculoskeletal: Negative for back pain.  Neurological: Negative for dizziness and headaches.    Vital Signs: BP (!) 130/83 (BP Location: Right Arm)   Pulse 83   Temp 98.4 F (36.9 C) (Oral)   Resp 16   SpO2 100%   Physical Exam Constitutional:      General: She is not in acute distress. HENT:     Mouth/Throat:     Mouth: Mucous membranes are moist.     Pharynx: Oropharynx is clear.  Cardiovascular:     Rate and Rhythm: Normal rate and regular rhythm.     Pulses: Normal pulses.     Heart sounds: Normal heart sounds.     Comments: Right upper chest port-a-cath. Not currently accessed.  Pulmonary:     Effort: Pulmonary effort is normal.     Breath sounds: Normal breath  sounds.  Abdominal:     General: Bowel sounds are normal.     Palpations: Abdomen is soft.  Musculoskeletal:        General: Normal range of motion.     Cervical back: Normal range of motion.  Skin:    General: Skin is warm and dry.  Neurological:     Mental Status: She is alert.     Imaging: CUP PACEART REMOTE DEVICE CHECK  Result Date: 05/03/2020 Carelink summary report received. Battery status OK. Normal device function. No new symptom episodes, tachy episodes, brady, or pause episodes. No new AF episodes. Monthly summary reports and ROV/PRN Kathy Breach, RN, CCDS, [employer]   Labs:  CBC: Recent Labs    03/22/20 0924 04/12/20 0926 04/18/20 0843 05/03/20 0751  WBC 8.5 2.4* 3.8* 1.0*  HGB 11.7* 10.1* 11.8* 10.5*  HCT 35.9* 31.4* 35.4* 33.0*  PLT 351 182 218 PENDING    COAGS: Recent Labs    01/19/20 1020  INR 0.9    BMP: Recent Labs    03/21/20 0928 03/22/20 0924 04/12/20 0926 04/18/20 0843  NA 138 136 142 141  K 4.6 4.8 3.6 3.3*  CL 105 105 110 106  CO2 _0 GLUCOSE 79 87 82 77  BUN 28* 21 7* 13  CALCIUM 9.8 10.2 9.7 9.4  CREATININE 1.05* 0.96 0.79 0.86  GFRNONAA 57* >60 >60 >60  GFRAA >60 >60 >60 >60    LIVER FUNCTION TESTS: Recent Labs    03/21/20 0928 03/22/20 0924 04/12/20 0926 04/18/20 0843  BILITOT 0.4 0.6 0.7 1.2  AST 9* 9* 9* 12*  ALT _1 ALKPHOS 78 75 66 74  PROT 7.1 7.2 6.5 6.3*  ALBUMIN 3.2* 3.2* 3.2* 3.2*    TUMOR MARKERS: No results for input(s): AFPTM, CEA, CA199, CHROMGRNA in the last 8760 hours.  Assessment and Plan:  Myelodysplastic Syndrome: Breanna Perry, 64 year old female, presents today to the [facility] department for an image-guided bone marrow biopsy and aspiration. She has a history of MDS and is  being treated with chemotherapy. Her oncologist is preparing her for a stem cell transplant.   Risks and benefits of a bone marrow biopsy and aspiration were  discussed with the patient including, but not limited to bleeding, infection, damage to adjacent structures or low yield requiring additional tests.  All of the questions were answered and there is agreement to proceed.  She has been NPO. Labs and vitals have been reviewed.  Consent signed and in chart.  Thank you for this interesting consult.  I greatly enjoyed meeting Breanna Perry and look forward to participating in their care.  A copy of this report was sent to the requesting provider on this date.  Electronically Signed: Jamie Covington, AGACNP-BC [phone] 05/03/2020, 8:26 AM   I spent a total of  30 Minutes   in face to face in clinical consultation, greater than 50% of which was counseling/coordinating care for image-guided bone marrow biopsy and aspiration.  

## 2020-05-04 NOTE — Progress Notes (Signed)
[facility] Summary Report / Loop Recorder 

## 2020-05-05 LAB — SURGICAL PATHOLOGY

## 2020-05-08 ENCOUNTER — Other Ambulatory Visit: Payer: Self-pay | Admitting: Oncology

## 2020-05-09 ENCOUNTER — Inpatient Hospital Stay: Payer: No Typology Code available for payment source

## 2020-05-09 ENCOUNTER — Inpatient Hospital Stay: Payer: No Typology Code available for payment source | Attending: Oncology

## 2020-05-09 ENCOUNTER — Other Ambulatory Visit: Payer: Self-pay

## 2020-05-09 ENCOUNTER — Telehealth: Payer: Self-pay

## 2020-05-09 ENCOUNTER — Inpatient Hospital Stay (HOSPITAL_BASED_OUTPATIENT_CLINIC_OR_DEPARTMENT_OTHER): Payer: No Typology Code available for payment source | Admitting: Oncology

## 2020-05-09 VITALS — BP 113/73 | HR 79 | Temp 97.5°F | Resp 18 | Ht 63.0 in | Wt 156.2 lb

## 2020-05-09 DIAGNOSIS — D469 Myelodysplastic syndrome, unspecified: Secondary | ICD-10-CM

## 2020-05-09 DIAGNOSIS — Z452 Encounter for adjustment and management of vascular access device: Secondary | ICD-10-CM | POA: Insufficient documentation

## 2020-05-09 DIAGNOSIS — Z95828 Presence of other vascular implants and grafts: Secondary | ICD-10-CM

## 2020-05-09 DIAGNOSIS — K5900 Constipation, unspecified: Secondary | ICD-10-CM | POA: Insufficient documentation

## 2020-05-09 DIAGNOSIS — Z5111 Encounter for antineoplastic chemotherapy: Secondary | ICD-10-CM | POA: Insufficient documentation

## 2020-05-09 DIAGNOSIS — E876 Hypokalemia: Secondary | ICD-10-CM | POA: Insufficient documentation

## 2020-05-09 DIAGNOSIS — Z79899 Other long term (current) drug therapy: Secondary | ICD-10-CM | POA: Diagnosis not present

## 2020-05-09 DIAGNOSIS — D649 Anemia, unspecified: Secondary | ICD-10-CM

## 2020-05-09 DIAGNOSIS — Z7982 Long term (current) use of aspirin: Secondary | ICD-10-CM | POA: Insufficient documentation

## 2020-05-09 LAB — CBC WITH DIFFERENTIAL (CANCER CENTER ONLY)
Abs Immature Granulocytes: 0 10*3/uL (ref 0.00–0.07)
Basophils Absolute: 0 10*3/uL (ref 0.0–0.1)
Basophils Relative: 1 %
Eosinophils Absolute: 0 10*3/uL (ref 0.0–0.5)
Eosinophils Relative: 4 %
HCT: 27.8 % — ABNORMAL LOW (ref 36.0–46.0)
Hemoglobin: 9.4 g/dL — ABNORMAL LOW (ref 12.0–15.0)
Immature Granulocytes: 0 %
Lymphocytes Relative: 71 %
Lymphs Abs: 0.7 10*3/uL (ref 0.7–4.0)
MCH: 32.3 pg (ref 26.0–34.0)
MCHC: 33.8 g/dL (ref 30.0–36.0)
MCV: 95.5 fL (ref 80.0–100.0)
Monocytes Absolute: 0.1 10*3/uL (ref 0.1–1.0)
Monocytes Relative: 7 %
Neutro Abs: 0.2 10*3/uL — CL (ref 1.7–7.7)
Neutrophils Relative %: 17 %
Platelet Count: 228 10*3/uL (ref 150–400)
RBC: 2.91 MIL/uL — ABNORMAL LOW (ref 3.87–5.11)
RDW: 18.1 % — ABNORMAL HIGH (ref 11.5–15.5)
WBC Count: 0.9 10*3/uL — CL (ref 4.0–10.5)
nRBC: 0 % (ref 0.0–0.2)

## 2020-05-09 LAB — CMP (CANCER CENTER ONLY)
ALT: <6 U/L (ref 0–44)
AST: 10 U/L — ABNORMAL LOW (ref 15–41)
Albumin: 2.7 g/dL — ABNORMAL LOW (ref 3.5–5.0)
Alkaline Phosphatase: 56 U/L (ref 38–126)
Anion gap: 8 (ref 5–15)
BUN: 7 mg/dL — ABNORMAL LOW (ref 8–23)
CO2: 25 mmol/L (ref 22–32)
Calcium: 10.4 mg/dL — ABNORMAL HIGH (ref 8.9–10.3)
Chloride: 104 mmol/L (ref 98–111)
Creatinine: 0.82 mg/dL (ref 0.44–1.00)
GFR, Est AFR Am: >60 mL/min (ref >60)
GFR, Estimated: >60 mL/min (ref >60)
Glucose, Bld: 99 mg/dL (ref 70–99)
Potassium: 4.2 mmol/L (ref 3.5–5.1)
Sodium: 137 mmol/L (ref 135–145)
Total Bilirubin: 0.6 mg/dL (ref 0.3–1.2)
Total Protein: 7.4 g/dL (ref 6.5–8.1)

## 2020-05-09 LAB — LACTATE DEHYDROGENASE: LDH: 110 U/L (ref 98–192)

## 2020-05-09 LAB — SAMPLE TO BLOOD BANK

## 2020-05-09 MED ORDER — SODIUM CHLORIDE 0.9% FLUSH
10.0000 mL | Freq: Once | INTRAVENOUS | Status: AC
  Administered 2020-05-09: 10 mL
  Filled 2020-05-09: qty 10

## 2020-05-09 MED ORDER — HEPARIN SOD (PORK) LOCK FLUSH 100 UNIT/ML IV SOLN
500.0000 [IU] | Freq: Once | INTRAVENOUS | Status: AC
  Administered 2020-05-09: 500 [IU]
  Filled 2020-05-09: qty 5

## 2020-05-09 NOTE — Telephone Encounter (Signed)
CRITICAL VALUE STICKER  CRITICAL VALUE: WBC 0.9  RECEIVER (on-site recipient of call): Wendall Mola, RN  [place] NOTIFIED: 05/09/20 @ [place] (representative from lab): Janeece Riggers  MD NOTIFIED: Dr. Dahlia Byes  TIME OF NOTIFICATION: 0910  RESPONSE: Acknowledged

## 2020-05-09 NOTE — Progress Notes (Signed)
[employer] patients request  patient has treatment all week and wants to stay accessed. Biopatch in place.

## 2020-05-09 NOTE — Patient Instructions (Signed)

## 2020-05-09 NOTE — Progress Notes (Signed)
Hematology and Oncology Follow Up Visit  Breanna Perry [id number] 20-Feb-1956 64 y.o. 05/09/2020 8:59 AM [name], MDSmith, [name], MD   Principle Diagnosis: 64 year old woman with myelodysplastic syndrome after presenting with anemia and increased blast count indicating IPSS-R score of 6 with12 to 14% blasts, 5 q. minus in February 2021.   Current therapy:   Supportive packed red cell transfusion.    Vidaza 75 mg per metered square IV started on January 18, 2020.  She is receiving 7 consecutive days every 4 weeks.  She completed 4 cycles of therapy. She is about to start cycle 5 of therapy which will be given for 5 days only.    Interim History: Breanna Perry presents today for a follow-up visit.  Since the last visit, she has tolerated the last cycle of therapy without any major complications.  She denies any nausea, vomiting or abdominal pain.  She does have periodic constipation is manageable.  Despite all of her treatment she still able to continue to work without any decline in her energy or performance status.  She denies any fevers or chills or dyspnea on exertion.     Medications: Updated on review. Current Outpatient Medications  Medication Sig Dispense Refill   allopurinol (ZYLOPRIM) 300 MG tablet TAKE 1 TABLET (300 MG TOTAL) BY MOUTH 2 (TWO) TIMES DAILY. 60 tablet 0   ARIPiprazole (ABILIFY) 5 MG tablet 5 mg.     aspirin EC 81 MG EC tablet Take 1 tablet (81 mg total) by mouth daily.     atorvastatin (LIPITOR) 40 MG tablet Take 1 tablet (40 mg total) by mouth daily at 6 PM. (Patient taking differently: Take 40 mg by mouth daily. ) 30 tablet 2   betamethasone dipropionate 0.05 % cream Apply topically 2 (two) times daily. Recently started (Patient not taking: Reported on 04/18/2020)     busPIRone (BUSPAR) 10 MG tablet Take 10 mg by mouth 2 (two) times daily.     citalopram (CELEXA) 40 MG tablet Take 40 mg by mouth daily.   1   colesevelam (WELCHOL) 625 MG tablet  Take 1,875 mg by mouth 2 (two) times daily.     EQ GENTLE LAXATIVE 5 MG EC tablet See admin instructions. (Patient not taking: Reported on 04/18/2020)     Eszopiclone 3 MG TABS Take 3 mg by mouth at bedtime as needed (sleep).      hydrocortisone 2.5 % cream Apply 1 application topically 2 (two) times daily. resently started (Patient not taking: Reported on 04/18/2020)     KLOR-CON M20 20 MEQ tablet TAKE 1 TABLET BY MOUTH TWICE A DAY 30 tablet 0   lidocaine-prilocaine (EMLA) cream Apply 1 application topically as needed. 30 g 0   metoprolol succinate (TOPROL-XL) 50 MG 24 hr tablet Take 25 mg by mouth.      pantoprazole (PROTONIX) 40 MG tablet Take 1 tablet (40 mg total) by mouth daily. 60 tablet 1   prochlorperazine (COMPAZINE) 10 MG tablet Take 1 tablet (10 mg total) by mouth every 6 (six) hours as needed for nausea or vomiting. 30 tablet 0   No current facility-administered medications for this visit.     Allergies: No Known Allergies    Physical Exam:     Blood pressure 113/73, pulse 79, temperature (!) 97.5 F (36.4 C), temperature source Temporal, resp. rate 18, height '5\' 3"'  (1.6 m), weight 156 lb 3.2 oz (70.9 kg), SpO2 100 %.    ECOG: 1    General appearance: Alert, awake without  any distress. Head: Atraumatic without abnormalities Oropharynx: Without any thrush or ulcers. Eyes: No scleral icterus. Lymph nodes: No lymphadenopathy noted in the cervical, supraclavicular, or axillary nodes Heart:regular rate and rhythm, without any murmurs or gallops.   Lung: Clear to auscultation without any rhonchi, wheezes or dullness to percussion. Abdomin: Soft, nontender without any shifting dullness or ascites. Musculoskeletal: No clubbing or cyanosis. Neurological: No motor or sensory deficits. Skin: No rashes or lesions.          Lab Results: Lab Results  Component Value Date   WBC 1.0 (LL) 05/03/2020   HGB 10.5 (L) 05/03/2020   HCT 33.0 (L) 05/03/2020   MCV  101.5 (H) 05/03/2020   PLT 80 (L) 05/03/2020     Chemistry      Component Value Date/Time   NA 141 04/18/2020 0843   NA 141 10/10/2018 1333   K 3.3 (L) 04/18/2020 0843   CL 106 04/18/2020 0843   CO2 27 04/18/2020 0843   BUN 13 04/18/2020 0843   BUN 12 10/10/2018 1333   CREATININE 0.86 04/18/2020 0843   CREATININE 1.01 03/29/2014 1428      Component Value Date/Time   CALCIUM 9.4 04/18/2020 0843   ALKPHOS 74 04/18/2020 0843   AST 12 (L) 04/18/2020 0843   ALT 18 04/18/2020 0843   BILITOT 1.2 04/18/2020 0843       Impression and Plan:   64 year old woman with:  1.  Myelodysplastic syndrome presented with with anemia, IPSS-R score is 6 and around 14% of blasts in February 2021.  She completed 4 cycles of Vidaza with reasonable overall response.  Bone marrow biopsy obtained prior to cycle 5 were reviewed today after completed on 05/03/2020.  Bone marrow is no longer showing any increased blast count at this time with overall hypocellular findings.  Risks and benefits of continuing this treatment for the time being were discussed.  Based on our recommendation from Dr. Florene Glen, we will continue Vidaza with 5 days instead of 7 days given her excellent response.  After completing few more cycles of therapy she will be assessed for potential high-dose chemotherapy with stem cell transplant.  Laboratory from today reviewed and showed neutropenia and her cycle 5 of therapy will be delayed for 1 week.  2.  Anemia: Hemoglobin has been adequate and has not required any transfusion support for the time being.  3.  IV access: Port-A-Cath continues to be in place without any issues.  4.  Constipation: Manageable at this time. 5.  Prognosis: Aggressive therapy is warranted given her reasonable performance status.  6.  Antiemetics: No nausea or vomiting reported at this time.  Antiemetics are available to her.  7.  Hypokalemia: Continues to take potassium supplement and will continue to  monitor.  8.  Hypotension: Resolved with normal blood pressure at this time.  9.  Follow-up: She will complete cycle 5 of therapy next week and return for cycle 6 in 4 weeks.   30  minutes were spent on this encounter.  The time was dedicated to reviewing laboratory data, discussing pathology results, reviewing future plan of care and answering questions regarding overall prognosis.    Zola Button, MD 8/2/20218:59 AM

## 2020-05-10 ENCOUNTER — Encounter (HOSPITAL_COMMUNITY): Payer: Self-pay | Admitting: Oncology

## 2020-05-10 ENCOUNTER — Encounter: Payer: Self-pay | Admitting: Oncology

## 2020-05-10 ENCOUNTER — Inpatient Hospital Stay: Payer: No Typology Code available for payment source

## 2020-05-11 ENCOUNTER — Inpatient Hospital Stay: Payer: No Typology Code available for payment source

## 2020-05-12 ENCOUNTER — Encounter (HOSPITAL_COMMUNITY): Payer: Self-pay | Admitting: Oncology

## 2020-05-12 ENCOUNTER — Inpatient Hospital Stay: Payer: No Typology Code available for payment source

## 2020-05-13 ENCOUNTER — Telehealth: Payer: Self-pay | Admitting: Oncology

## 2020-05-13 ENCOUNTER — Inpatient Hospital Stay: Payer: No Typology Code available for payment source

## 2020-05-13 NOTE — Telephone Encounter (Signed)
Scheduled per 08/02 los, patient has been called and notified.

## 2020-05-16 ENCOUNTER — Inpatient Hospital Stay: Payer: No Typology Code available for payment source

## 2020-05-16 ENCOUNTER — Ambulatory Visit: Payer: No Typology Code available for payment source

## 2020-05-16 ENCOUNTER — Other Ambulatory Visit: Payer: Self-pay

## 2020-05-16 ENCOUNTER — Inpatient Hospital Stay (HOSPITAL_BASED_OUTPATIENT_CLINIC_OR_DEPARTMENT_OTHER): Payer: No Typology Code available for payment source | Admitting: Oncology

## 2020-05-16 VITALS — BP 122/76 | HR 72 | Temp 97.4°F | Resp 18 | Ht 63.0 in | Wt 158.3 lb

## 2020-05-16 DIAGNOSIS — D469 Myelodysplastic syndrome, unspecified: Secondary | ICD-10-CM

## 2020-05-16 DIAGNOSIS — Z5111 Encounter for antineoplastic chemotherapy: Secondary | ICD-10-CM | POA: Diagnosis not present

## 2020-05-16 DIAGNOSIS — D649 Anemia, unspecified: Secondary | ICD-10-CM

## 2020-05-16 LAB — CBC WITH DIFFERENTIAL (CANCER CENTER ONLY)
Abs Immature Granulocytes: 0.08 10*3/uL — ABNORMAL HIGH (ref 0.00–0.07)
Basophils Absolute: 0 10*3/uL (ref 0.0–0.1)
Basophils Relative: 0 %
Eosinophils Absolute: 0 10*3/uL (ref 0.0–0.5)
Eosinophils Relative: 2 %
HCT: 28 % — ABNORMAL LOW (ref 36.0–46.0)
Hemoglobin: 9.3 g/dL — ABNORMAL LOW (ref 12.0–15.0)
Immature Granulocytes: 3 %
Lymphocytes Relative: 45 %
Lymphs Abs: 1.2 10*3/uL (ref 0.7–4.0)
MCH: 32.2 pg (ref 26.0–34.0)
MCHC: 33.2 g/dL (ref 30.0–36.0)
MCV: 96.9 fL (ref 80.0–100.0)
Monocytes Absolute: 0.2 10*3/uL (ref 0.1–1.0)
Monocytes Relative: 7 %
Neutro Abs: 1.2 10*3/uL — ABNORMAL LOW (ref 1.7–7.7)
Neutrophils Relative %: 43 %
Platelet Count: 260 10*3/uL (ref 150–400)
RBC: 2.89 MIL/uL — ABNORMAL LOW (ref 3.87–5.11)
RDW: 18.5 % — ABNORMAL HIGH (ref 11.5–15.5)
WBC Count: 2.7 10*3/uL — ABNORMAL LOW (ref 4.0–10.5)
nRBC: 0 % (ref 0.0–0.2)

## 2020-05-16 LAB — CMP (CANCER CENTER ONLY)
ALT: <6 U/L (ref 0–44)
AST: 6 U/L — ABNORMAL LOW (ref 15–41)
Albumin: 2.8 g/dL — ABNORMAL LOW (ref 3.5–5.0)
Alkaline Phosphatase: 70 U/L (ref 38–126)
Anion gap: 8 (ref 5–15)
BUN: 11 mg/dL (ref 8–23)
CO2: 24 mmol/L (ref 22–32)
Calcium: 10.2 mg/dL (ref 8.9–10.3)
Chloride: 109 mmol/L (ref 98–111)
Creatinine: 0.88 mg/dL (ref 0.44–1.00)
GFR, Est AFR Am: >60 mL/min (ref >60)
GFR, Estimated: >60 mL/min (ref >60)
Glucose, Bld: 104 mg/dL — ABNORMAL HIGH (ref 70–99)
Potassium: 3.9 mmol/L (ref 3.5–5.1)
Sodium: 141 mmol/L (ref 135–145)
Total Bilirubin: 0.3 mg/dL (ref 0.3–1.2)
Total Protein: 7.1 g/dL (ref 6.5–8.1)

## 2020-05-16 LAB — SAMPLE TO BLOOD BANK

## 2020-05-16 LAB — LACTATE DEHYDROGENASE: LDH: 123 U/L (ref 98–192)

## 2020-05-16 MED ORDER — SODIUM CHLORIDE 0.9 % IV SOLN
10.0000 mg | Freq: Once | INTRAVENOUS | Status: AC
  Administered 2020-05-16: 10 mg via INTRAVENOUS
  Filled 2020-05-16: qty 1
  Filled 2020-05-16: qty 10

## 2020-05-16 MED ORDER — HEPARIN SOD (PORK) LOCK FLUSH 100 UNIT/ML IV SOLN
500.0000 [IU] | Freq: Once | INTRAVENOUS | Status: AC | PRN
  Administered 2020-05-16: 500 [IU]
  Filled 2020-05-16: qty 5

## 2020-05-16 MED ORDER — SODIUM CHLORIDE 0.9 % IV SOLN
Freq: Once | INTRAVENOUS | Status: AC
  Filled 2020-05-16: qty 250

## 2020-05-16 MED ORDER — SODIUM CHLORIDE 0.9% FLUSH
10.0000 mL | INTRAVENOUS | Status: DC | PRN
  Administered 2020-05-16: 10 mL
  Filled 2020-05-16: qty 10

## 2020-05-16 MED ORDER — PALONOSETRON HCL INJECTION 0.25 MG/5ML
0.2500 mg | Freq: Once | INTRAVENOUS | Status: AC
  Administered 2020-05-16: 0.25 mg via INTRAVENOUS

## 2020-05-16 MED ORDER — SODIUM CHLORIDE 0.9 % IV SOLN
75.0000 mg/m2 | Freq: Once | INTRAVENOUS | Status: AC
  Administered 2020-05-16: 135 mg via INTRAVENOUS
  Filled 2020-05-16: qty 13.5

## 2020-05-16 MED ORDER — PALONOSETRON HCL INJECTION 0.25 MG/5ML
INTRAVENOUS | Status: AC
  Filled 2020-05-16: qty 5

## 2020-05-16 NOTE — Progress Notes (Signed)
Hematology and Oncology Follow Up Visit  Breanna Perry [id number] 1955-12-14 64 y.o. 05/16/2020 10:22 AM Breanna Perry, MDSmith, [name], MD   Principle Diagnosis: 64 year old woman with MDS presented with IPSS-R score of 6,14% blasts, 5 q. minus and symptomatic anemia in February 2021.   Current therapy:   Supportive packed red cell transfusion.    Vidaza 75 mg per metered square IV started on January 18, 2020.  She is receiving 7 consecutive days every 4 weeks.  She completed 4 cycles of therapy.  She is to start cycle 5 of therapy which will be given for 5 days only.  Day 1 of cycle 1 on 05/16/2020.    Interim History: Breanna Perry returns today for a repeat follow-up.  Since the last visit, she reports no major changes in her health.  She denies any fatigue or weakness.  She denies any nausea vomiting or abdominal pain.  She denies any hospitalization or illnesses.     Medications: Unchanged on review. Current Outpatient Medications  Medication Sig Dispense Refill  . allopurinol (ZYLOPRIM) 300 MG tablet TAKE 1 TABLET (300 MG TOTAL) BY MOUTH 2 (TWO) TIMES DAILY. 60 tablet 0  . ARIPiprazole (ABILIFY) 5 MG tablet 5 mg.    . aspirin EC 81 MG EC tablet Take 1 tablet (81 mg total) by mouth daily.    Marland Kitchen atorvastatin (LIPITOR) 40 MG tablet Take 1 tablet (40 mg total) by mouth daily at 6 PM. (Patient taking differently: Take 40 mg by mouth daily. ) 30 tablet 2  . betamethasone dipropionate 0.05 % cream Apply topically 2 (two) times daily. Recently started (Patient not taking: Reported on 04/18/2020)    . busPIRone (BUSPAR) 10 MG tablet Take 10 mg by mouth 2 (two) times daily.    . citalopram (CELEXA) 40 MG tablet Take 40 mg by mouth daily.   1  . colesevelam (WELCHOL) 625 MG tablet Take 1,875 mg by mouth 2 (two) times daily.    . EQ GENTLE LAXATIVE 5 MG EC tablet See admin instructions. (Patient not taking: Reported on 04/18/2020)    . Eszopiclone 3 MG TABS Take 3 mg by mouth at bedtime as  needed (sleep).     . hydrocortisone 2.5 % cream Apply 1 application topically 2 (two) times daily. resently started (Patient not taking: Reported on 04/18/2020)    . KLOR-CON M20 20 MEQ tablet TAKE 1 TABLET BY MOUTH TWICE A DAY 30 tablet 0  . lidocaine-prilocaine (EMLA) cream Apply 1 application topically as needed. 30 g 0  . metoprolol succinate (TOPROL-XL) 50 MG 24 hr tablet Take 25 mg by mouth.     . pantoprazole (PROTONIX) 40 MG tablet Take 1 tablet (40 mg total) by mouth daily. 60 tablet 1  . prochlorperazine (COMPAZINE) 10 MG tablet Take 1 tablet (10 mg total) by mouth every 6 (six) hours as needed for nausea or vomiting. 30 tablet 0   No current facility-administered medications for this visit.     Allergies: No Known Allergies    Physical Exam:    Blood pressure 122/76, pulse 72, temperature (!) 97.4 F (36.3 C), temperature source Tympanic, resp. rate 18, height 5\' 3"  (1.6 m), weight 158 lb 4.8 oz (71.8 kg), SpO2 100 %.      ECOG: 1    General appearance: Comfortable appearing without any discomfort Head: Normocephalic without any trauma Oropharynx: Mucous membranes are moist and pink without any thrush or ulcers. Eyes: Pupils are equal and round reactive to light. Lymph nodes:  No cervical, supraclavicular, inguinal or axillary lymphadenopathy.   Heart:regular rate and rhythm.  S1 and S2 without leg edema. Lung: Clear without any rhonchi or wheezes.  No dullness to percussion. Abdomin: Soft, nontender, nondistended with good bowel sounds.  No hepatosplenomegaly. Musculoskeletal: No joint deformity or effusion.  Full range of motion noted. Neurological: No deficits noted on motor, sensory and deep tendon reflex exam. Skin: No petechial rash or dryness.  Appeared moist.            Lab Results: Lab Results  Component Value Date   WBC 2.7 (L) 05/16/2020   HGB 9.3 (L) 05/16/2020   HCT 28.0 (L) 05/16/2020   MCV 96.9 05/16/2020   PLT 260 05/16/2020      Chemistry      Component Value Date/Time   NA 137 05/09/2020 0843   NA 141 10/10/2018 1333   K 4.2 05/09/2020 0843   CL 104 05/09/2020 0843   CO2 25 05/09/2020 0843   BUN 7 (L) 05/09/2020 0843   BUN 12 10/10/2018 1333   CREATININE 0.82 05/09/2020 0843   CREATININE 1.01 03/29/2014 1428      Component Value Date/Time   CALCIUM 10.4 (H) 05/09/2020 0843   ALKPHOS 56 05/09/2020 0843   AST 10 (L) 05/09/2020 0843   ALT <6 05/09/2020 0843   BILITOT 0.6 05/09/2020 0843       Impression and Plan:   64 year old woman with:  1.  MDS diagnosed in February 2021.  She was found to have  IPSS-R score is 6 and around 14% of blasts with symptomatic anemia.  She has received Vidaza with excellent response after 4 cycles of therapy with improvement in her counts and reduction or transfusion need.  Risks and benefits of proceeding with cycle 5 of therapy were reviewed today.  Her cycle was delayed because of neutropenia with counts improve at this time.  Risks and benefits of resuming Vidaza were discussed today and she is agreeable to proceed.  2.  Anemia: No need for transfusion at this time hemoglobin is adequate.  3.  IV access: Port-A-Cath remains in place without any complications.  4.  Constipation: Related to Greasy which is improved at this time.   5.  Prognosis: Her disease is treatable and aggressive measures are warranted at this time given her excellent performance status.  6.  Antiemetics: Compazine is available to her without any recent nausea or vomiting..  7.  Hypokalemia: Corrected with potassium back to normal range.   8.  Hypotension: Resolved with normal blood pressure at this time.  9.  Follow-up: She will continue with cycle 5 of therapy this week and return in 4 weeks for the start of cycle 6.   30  minutes were spent on this encounter.  The time was dedicated to reviewing laboratory data, discussing pathology results, reviewing future plan of care and answering  questions regarding overall prognosis.    Zola Button, MD 8/9/202110:22 AM

## 2020-05-16 NOTE — Progress Notes (Signed)
ANC 1.2 today. Okay to treat per Dr Alen Blew.

## 2020-05-16 NOTE — Patient Instructions (Signed)

## 2020-05-16 NOTE — Patient Instructions (Signed)
[facility] Discharge Instructions for Patients Receiving Chemotherapy  Today you received the following chemotherapy agents Vidaza  To help prevent nausea and vomiting after your treatment, we encourage you to take your nausea medication as directed  If you develop nausea and vomiting that is not controlled by your nausea medication, call the clinic.   BELOW ARE SYMPTOMS THAT SHOULD BE REPORTED IMMEDIATELY:  *FEVER GREATER THAN 100.5 F  *CHILLS WITH OR WITHOUT FEVER  NAUSEA AND VOMITING THAT IS NOT CONTROLLED WITH YOUR NAUSEA MEDICATION  *UNUSUAL SHORTNESS OF BREATH  *UNUSUAL BRUISING OR BLEEDING  TENDERNESS IN MOUTH AND THROAT WITH OR WITHOUT PRESENCE OF ULCERS  *URINARY PROBLEMS  *BOWEL PROBLEMS  UNUSUAL RASH Items with * indicate a potential emergency and should be followed up as soon as possible.  Feel free to call the clinic should you have any questions or concerns. The clinic phone number is [phone].  Please show the CHEMO ALERT CARD at check-in to the Emergency Department and triage nurse.   

## 2020-05-17 ENCOUNTER — Other Ambulatory Visit: Payer: Self-pay

## 2020-05-17 ENCOUNTER — Inpatient Hospital Stay: Payer: No Typology Code available for payment source

## 2020-05-17 VITALS — BP 142/87 | HR 62 | Temp 97.8°F | Resp 16

## 2020-05-17 DIAGNOSIS — E876 Hypokalemia: Secondary | ICD-10-CM | POA: Diagnosis not present

## 2020-05-17 DIAGNOSIS — Z79899 Other long term (current) drug therapy: Secondary | ICD-10-CM | POA: Diagnosis not present

## 2020-05-17 DIAGNOSIS — D469 Myelodysplastic syndrome, unspecified: Secondary | ICD-10-CM | POA: Diagnosis not present

## 2020-05-17 DIAGNOSIS — Z7982 Long term (current) use of aspirin: Secondary | ICD-10-CM | POA: Diagnosis not present

## 2020-05-17 DIAGNOSIS — Z452 Encounter for adjustment and management of vascular access device: Secondary | ICD-10-CM | POA: Diagnosis not present

## 2020-05-17 DIAGNOSIS — Z5111 Encounter for antineoplastic chemotherapy: Secondary | ICD-10-CM | POA: Diagnosis not present

## 2020-05-17 DIAGNOSIS — K5900 Constipation, unspecified: Secondary | ICD-10-CM | POA: Diagnosis not present

## 2020-05-17 MED ORDER — SODIUM CHLORIDE 0.9% FLUSH
10.0000 mL | INTRAVENOUS | Status: DC | PRN
  Administered 2020-05-17: 10 mL
  Filled 2020-05-17: qty 10

## 2020-05-17 MED ORDER — SODIUM CHLORIDE 0.9 % IV SOLN
75.0000 mg/m2 | Freq: Once | INTRAVENOUS | Status: AC
  Administered 2020-05-17: 135 mg via INTRAVENOUS
  Filled 2020-05-17: qty 13.5

## 2020-05-17 MED ORDER — HEPARIN SOD (PORK) LOCK FLUSH 100 UNIT/ML IV SOLN
500.0000 [IU] | Freq: Once | INTRAVENOUS | Status: AC | PRN
  Administered 2020-05-17: 500 [IU]
  Filled 2020-05-17: qty 5

## 2020-05-17 MED ORDER — SODIUM CHLORIDE 0.9 % IV SOLN
10.0000 mg | Freq: Once | INTRAVENOUS | Status: AC
  Administered 2020-05-17: 10 mg via INTRAVENOUS
  Filled 2020-05-17: qty 10

## 2020-05-17 MED ORDER — SODIUM CHLORIDE 0.9 % IV SOLN
Freq: Once | INTRAVENOUS | Status: AC
  Filled 2020-05-17: qty 250

## 2020-05-17 NOTE — Patient Instructions (Addendum)
[facility] Discharge Instructions for Patients Receiving Chemotherapy  Today you received the following chemotherapy agent: Azacitidine (Vidaza)  To help prevent nausea and vomiting after your treatment, we encourage you to take your nausea medication as directed by your MD.   If you develop nausea and vomiting that is not controlled by your nausea medication, call the clinic.   BELOW ARE SYMPTOMS THAT SHOULD BE REPORTED IMMEDIATELY:  *FEVER GREATER THAN 100.5 F  *CHILLS WITH OR WITHOUT FEVER  NAUSEA AND VOMITING THAT IS NOT CONTROLLED WITH YOUR NAUSEA MEDICATION  *UNUSUAL SHORTNESS OF BREATH  *UNUSUAL BRUISING OR BLEEDING  TENDERNESS IN MOUTH AND THROAT WITH OR WITHOUT PRESENCE OF ULCERS  *URINARY PROBLEMS  *BOWEL PROBLEMS  UNUSUAL RASH Items with * indicate a potential emergency and should be followed up as soon as possible.  Feel free to call the clinic should you have any questions or concerns. The clinic phone number is [phone].  Please show the CHEMO ALERT CARD at check-in to the Emergency Department and triage nurse.   

## 2020-05-18 ENCOUNTER — Other Ambulatory Visit: Payer: Self-pay

## 2020-05-18 ENCOUNTER — Inpatient Hospital Stay: Payer: No Typology Code available for payment source

## 2020-05-18 VITALS — BP 150/88 | HR 61 | Temp 98.2°F | Resp 17 | Ht 63.0 in | Wt 160.5 lb

## 2020-05-18 DIAGNOSIS — D469 Myelodysplastic syndrome, unspecified: Secondary | ICD-10-CM

## 2020-05-18 DIAGNOSIS — Z452 Encounter for adjustment and management of vascular access device: Secondary | ICD-10-CM | POA: Diagnosis not present

## 2020-05-18 DIAGNOSIS — Z5111 Encounter for antineoplastic chemotherapy: Secondary | ICD-10-CM | POA: Diagnosis not present

## 2020-05-18 DIAGNOSIS — Z79899 Other long term (current) drug therapy: Secondary | ICD-10-CM | POA: Diagnosis not present

## 2020-05-18 DIAGNOSIS — K5900 Constipation, unspecified: Secondary | ICD-10-CM | POA: Diagnosis not present

## 2020-05-18 DIAGNOSIS — E876 Hypokalemia: Secondary | ICD-10-CM | POA: Diagnosis not present

## 2020-05-18 DIAGNOSIS — Z7982 Long term (current) use of aspirin: Secondary | ICD-10-CM | POA: Diagnosis not present

## 2020-05-18 MED ORDER — SODIUM CHLORIDE 0.9 % IV SOLN
10.0000 mg | Freq: Once | INTRAVENOUS | Status: AC
  Administered 2020-05-18: 10 mg via INTRAVENOUS
  Filled 2020-05-18: qty 10

## 2020-05-18 MED ORDER — PALONOSETRON HCL INJECTION 0.25 MG/5ML
INTRAVENOUS | Status: AC
  Filled 2020-05-18: qty 5

## 2020-05-18 MED ORDER — PALONOSETRON HCL INJECTION 0.25 MG/5ML
0.2500 mg | Freq: Once | INTRAVENOUS | Status: AC
  Administered 2020-05-18: 0.25 mg via INTRAVENOUS

## 2020-05-18 MED ORDER — SODIUM CHLORIDE 0.9 % IV SOLN
75.0000 mg/m2 | Freq: Once | INTRAVENOUS | Status: AC
  Administered 2020-05-18: 135 mg via INTRAVENOUS
  Filled 2020-05-18: qty 13.5

## 2020-05-18 MED ORDER — SODIUM CHLORIDE 0.9% FLUSH
10.0000 mL | INTRAVENOUS | Status: DC | PRN
  Administered 2020-05-18: 10 mL
  Filled 2020-05-18: qty 10

## 2020-05-18 MED ORDER — HEPARIN SOD (PORK) LOCK FLUSH 100 UNIT/ML IV SOLN
500.0000 [IU] | Freq: Once | INTRAVENOUS | Status: AC | PRN
  Administered 2020-05-18: 500 [IU]
  Filled 2020-05-18: qty 5

## 2020-05-18 MED ORDER — SODIUM CHLORIDE 0.9 % IV SOLN
Freq: Once | INTRAVENOUS | Status: AC
  Filled 2020-05-18: qty 250

## 2020-05-18 NOTE — Patient Instructions (Signed)
[facility] Discharge Instructions for Patients Receiving Chemotherapy  Today you received the following chemotherapy agents:  Vidaza  To help prevent nausea and vomiting after your treatment, we encourage you to take your nausea medication as prescribed.   If you develop nausea and vomiting that is not controlled by your nausea medication, call the clinic.   BELOW ARE SYMPTOMS THAT SHOULD BE REPORTED IMMEDIATELY:  *FEVER GREATER THAN 100.5 F  *CHILLS WITH OR WITHOUT FEVER  NAUSEA AND VOMITING THAT IS NOT CONTROLLED WITH YOUR NAUSEA MEDICATION  *UNUSUAL SHORTNESS OF BREATH  *UNUSUAL BRUISING OR BLEEDING  TENDERNESS IN MOUTH AND THROAT WITH OR WITHOUT PRESENCE OF ULCERS  *URINARY PROBLEMS  *BOWEL PROBLEMS  UNUSUAL RASH Items with * indicate a potential emergency and should be followed up as soon as possible.  Feel free to call the clinic should you have any questions or concerns. The clinic phone number is [phone].  Please show the CHEMO ALERT CARD at check-in to the Emergency Department and triage nurse.   

## 2020-05-19 ENCOUNTER — Inpatient Hospital Stay: Payer: No Typology Code available for payment source

## 2020-05-19 ENCOUNTER — Other Ambulatory Visit: Payer: Self-pay

## 2020-05-19 VITALS — BP 146/94 | HR 55 | Temp 98.1°F | Resp 18

## 2020-05-19 DIAGNOSIS — Z5111 Encounter for antineoplastic chemotherapy: Secondary | ICD-10-CM | POA: Diagnosis not present

## 2020-05-19 DIAGNOSIS — D469 Myelodysplastic syndrome, unspecified: Secondary | ICD-10-CM

## 2020-05-19 DIAGNOSIS — E876 Hypokalemia: Secondary | ICD-10-CM | POA: Diagnosis not present

## 2020-05-19 DIAGNOSIS — Z7982 Long term (current) use of aspirin: Secondary | ICD-10-CM | POA: Diagnosis not present

## 2020-05-19 DIAGNOSIS — Z452 Encounter for adjustment and management of vascular access device: Secondary | ICD-10-CM | POA: Diagnosis not present

## 2020-05-19 DIAGNOSIS — Z79899 Other long term (current) drug therapy: Secondary | ICD-10-CM | POA: Diagnosis not present

## 2020-05-19 DIAGNOSIS — K5900 Constipation, unspecified: Secondary | ICD-10-CM | POA: Diagnosis not present

## 2020-05-19 MED ORDER — SODIUM CHLORIDE 0.9 % IV SOLN
10.0000 mg | Freq: Once | INTRAVENOUS | Status: AC
  Administered 2020-05-19: 10 mg via INTRAVENOUS
  Filled 2020-05-19: qty 10

## 2020-05-19 MED ORDER — SODIUM CHLORIDE 0.9 % IV SOLN
75.0000 mg/m2 | Freq: Once | INTRAVENOUS | Status: AC
  Administered 2020-05-19: 135 mg via INTRAVENOUS
  Filled 2020-05-19: qty 13.5

## 2020-05-19 MED ORDER — HEPARIN SOD (PORK) LOCK FLUSH 100 UNIT/ML IV SOLN
500.0000 [IU] | Freq: Once | INTRAVENOUS | Status: AC | PRN
  Administered 2020-05-19: 500 [IU]
  Filled 2020-05-19: qty 5

## 2020-05-19 MED ORDER — SODIUM CHLORIDE 0.9 % IV SOLN
Freq: Once | INTRAVENOUS | Status: AC
  Filled 2020-05-19: qty 250

## 2020-05-19 MED ORDER — SODIUM CHLORIDE 0.9% FLUSH
10.0000 mL | INTRAVENOUS | Status: DC | PRN
  Administered 2020-05-19: 10 mL
  Filled 2020-05-19: qty 10

## 2020-05-19 NOTE — Patient Instructions (Signed)
[facility] Discharge Instructions for Patients Receiving Chemotherapy  Today you received the following chemotherapy agent: Azacitidine (Vidaza)  To help prevent nausea and vomiting after your treatment, we encourage you to take your nausea medication as directed by your MD.   If you develop nausea and vomiting that is not controlled by your nausea medication, call the clinic.   BELOW ARE SYMPTOMS THAT SHOULD BE REPORTED IMMEDIATELY:  *FEVER GREATER THAN 100.5 F  *CHILLS WITH OR WITHOUT FEVER  NAUSEA AND VOMITING THAT IS NOT CONTROLLED WITH YOUR NAUSEA MEDICATION  *UNUSUAL SHORTNESS OF BREATH  *UNUSUAL BRUISING OR BLEEDING  TENDERNESS IN MOUTH AND THROAT WITH OR WITHOUT PRESENCE OF ULCERS  *URINARY PROBLEMS  *BOWEL PROBLEMS  UNUSUAL RASH Items with * indicate a potential emergency and should be followed up as soon as possible.  Feel free to call the clinic should you have any questions or concerns. The clinic phone number is [phone].  Please show the CHEMO ALERT CARD at check-in to the Emergency Department and triage nurse.   

## 2020-05-20 ENCOUNTER — Other Ambulatory Visit: Payer: Self-pay

## 2020-05-20 ENCOUNTER — Inpatient Hospital Stay: Payer: No Typology Code available for payment source

## 2020-05-20 VITALS — BP 127/86 | HR 65 | Temp 97.9°F | Resp 18

## 2020-05-20 DIAGNOSIS — Z452 Encounter for adjustment and management of vascular access device: Secondary | ICD-10-CM | POA: Diagnosis not present

## 2020-05-20 DIAGNOSIS — Z79899 Other long term (current) drug therapy: Secondary | ICD-10-CM | POA: Diagnosis not present

## 2020-05-20 DIAGNOSIS — Z5111 Encounter for antineoplastic chemotherapy: Secondary | ICD-10-CM | POA: Diagnosis not present

## 2020-05-20 DIAGNOSIS — Z7982 Long term (current) use of aspirin: Secondary | ICD-10-CM | POA: Diagnosis not present

## 2020-05-20 DIAGNOSIS — D469 Myelodysplastic syndrome, unspecified: Secondary | ICD-10-CM

## 2020-05-20 DIAGNOSIS — E876 Hypokalemia: Secondary | ICD-10-CM | POA: Diagnosis not present

## 2020-05-20 DIAGNOSIS — K5900 Constipation, unspecified: Secondary | ICD-10-CM | POA: Diagnosis not present

## 2020-05-20 MED ORDER — HEPARIN SOD (PORK) LOCK FLUSH 100 UNIT/ML IV SOLN
500.0000 [IU] | Freq: Once | INTRAVENOUS | Status: AC | PRN
  Administered 2020-05-20: 500 [IU]
  Filled 2020-05-20: qty 5

## 2020-05-20 MED ORDER — SODIUM CHLORIDE 0.9 % IV SOLN
10.0000 mg | Freq: Once | INTRAVENOUS | Status: AC
  Administered 2020-05-20: 10 mg via INTRAVENOUS
  Filled 2020-05-20: qty 10

## 2020-05-20 MED ORDER — SODIUM CHLORIDE 0.9% FLUSH
10.0000 mL | INTRAVENOUS | Status: DC | PRN
  Administered 2020-05-20: 10 mL
  Filled 2020-05-20: qty 10

## 2020-05-20 MED ORDER — SODIUM CHLORIDE 0.9 % IV SOLN
75.0000 mg/m2 | Freq: Once | INTRAVENOUS | Status: AC
  Administered 2020-05-20: 135 mg via INTRAVENOUS
  Filled 2020-05-20: qty 13.5

## 2020-05-20 MED ORDER — SODIUM CHLORIDE 0.9 % IV SOLN
Freq: Once | INTRAVENOUS | Status: AC
  Filled 2020-05-20: qty 250

## 2020-05-20 MED ORDER — PALONOSETRON HCL INJECTION 0.25 MG/5ML
0.2500 mg | Freq: Once | INTRAVENOUS | Status: AC
  Administered 2020-05-20: 0.25 mg via INTRAVENOUS

## 2020-05-20 NOTE — Patient Instructions (Signed)
[facility] Discharge Instructions for Patients Receiving Chemotherapy  Today you received the following chemotherapy agents Vidaza  To help prevent nausea and vomiting after your treatment, we encourage you to take your nausea medication as directed  If you develop nausea and vomiting that is not controlled by your nausea medication, call the clinic.   BELOW ARE SYMPTOMS THAT SHOULD BE REPORTED IMMEDIATELY:  *FEVER GREATER THAN 100.5 F  *CHILLS WITH OR WITHOUT FEVER  NAUSEA AND VOMITING THAT IS NOT CONTROLLED WITH YOUR NAUSEA MEDICATION  *UNUSUAL SHORTNESS OF BREATH  *UNUSUAL BRUISING OR BLEEDING  TENDERNESS IN MOUTH AND THROAT WITH OR WITHOUT PRESENCE OF ULCERS  *URINARY PROBLEMS  *BOWEL PROBLEMS  UNUSUAL RASH Items with * indicate a potential emergency and should be followed up as soon as possible.  Feel free to call the clinic should you have any questions or concerns. The clinic phone number is [phone].  Please show the CHEMO ALERT CARD at check-in to the Emergency Department and triage nurse.   

## 2020-05-21 ENCOUNTER — Other Ambulatory Visit: Payer: Self-pay | Admitting: Oncology

## 2020-05-23 ENCOUNTER — Inpatient Hospital Stay (HOSPITAL_BASED_OUTPATIENT_CLINIC_OR_DEPARTMENT_OTHER): Payer: No Typology Code available for payment source | Admitting: Medical

## 2020-05-23 ENCOUNTER — Telehealth: Payer: Self-pay | Admitting: *Deleted

## 2020-05-23 ENCOUNTER — Other Ambulatory Visit: Payer: Self-pay | Admitting: Oncology

## 2020-05-23 ENCOUNTER — Encounter: Payer: Self-pay | Admitting: Medical

## 2020-05-23 ENCOUNTER — Other Ambulatory Visit: Payer: Self-pay | Admitting: *Deleted

## 2020-05-23 ENCOUNTER — Encounter: Payer: Self-pay | Admitting: Oncology

## 2020-05-23 ENCOUNTER — Other Ambulatory Visit: Payer: Self-pay

## 2020-05-23 ENCOUNTER — Inpatient Hospital Stay: Payer: No Typology Code available for payment source

## 2020-05-23 VITALS — BP 86/61 | HR 77 | Temp 97.0°F | Resp 18 | Ht 63.0 in | Wt 154.8 lb

## 2020-05-23 DIAGNOSIS — D469 Myelodysplastic syndrome, unspecified: Secondary | ICD-10-CM

## 2020-05-23 DIAGNOSIS — Z5111 Encounter for antineoplastic chemotherapy: Secondary | ICD-10-CM | POA: Diagnosis not present

## 2020-05-23 DIAGNOSIS — I9589 Other hypotension: Secondary | ICD-10-CM | POA: Diagnosis not present

## 2020-05-23 DIAGNOSIS — Z95828 Presence of other vascular implants and grafts: Secondary | ICD-10-CM

## 2020-05-23 LAB — CMP (CANCER CENTER ONLY)
ALT: 6 U/L (ref 0–44)
AST: 7 U/L — ABNORMAL LOW (ref 15–41)
Albumin: 3.1 g/dL — ABNORMAL LOW (ref 3.5–5.0)
Alkaline Phosphatase: 67 U/L (ref 38–126)
Anion gap: 11 (ref 5–15)
BUN: 21 mg/dL (ref 8–23)
CO2: 25 mmol/L (ref 22–32)
Calcium: 10.1 mg/dL (ref 8.9–10.3)
Chloride: 102 mmol/L (ref 98–111)
Creatinine: 1.34 mg/dL — ABNORMAL HIGH (ref 0.44–1.00)
GFR, Est AFR Am: 49 mL/min — ABNORMAL LOW (ref >60)
GFR, Estimated: 42 mL/min — ABNORMAL LOW (ref >60)
Glucose, Bld: 96 mg/dL (ref 70–99)
Potassium: 3.4 mmol/L — ABNORMAL LOW (ref 3.5–5.1)
Sodium: 138 mmol/L (ref 135–145)
Total Bilirubin: 0.7 mg/dL (ref 0.3–1.2)
Total Protein: 7 g/dL (ref 6.5–8.1)

## 2020-05-23 LAB — CBC WITH DIFFERENTIAL (CANCER CENTER ONLY)
Abs Immature Granulocytes: 0.05 10*3/uL (ref 0.00–0.07)
Basophils Absolute: 0 10*3/uL (ref 0.0–0.1)
Basophils Relative: 0 %
Eosinophils Absolute: 0 10*3/uL (ref 0.0–0.5)
Eosinophils Relative: 0 %
HCT: 35.6 % — ABNORMAL LOW (ref 36.0–46.0)
Hemoglobin: 11.8 g/dL — ABNORMAL LOW (ref 12.0–15.0)
Immature Granulocytes: 1 %
Lymphocytes Relative: 39 %
Lymphs Abs: 3.3 10*3/uL (ref 0.7–4.0)
MCH: 32.3 pg (ref 26.0–34.0)
MCHC: 33.1 g/dL (ref 30.0–36.0)
MCV: 97.5 fL (ref 80.0–100.0)
Monocytes Absolute: 0.4 10*3/uL (ref 0.1–1.0)
Monocytes Relative: 5 %
Neutro Abs: 4.7 10*3/uL (ref 1.7–7.7)
Neutrophils Relative %: 55 %
Platelet Count: 190 10*3/uL (ref 150–400)
RBC: 3.65 MIL/uL — ABNORMAL LOW (ref 3.87–5.11)
RDW: 19.1 % — ABNORMAL HIGH (ref 11.5–15.5)
WBC Count: 8.5 10*3/uL (ref 4.0–10.5)
nRBC: 0.7 % — ABNORMAL HIGH (ref 0.0–0.2)

## 2020-05-23 LAB — LACTATE DEHYDROGENASE: LDH: 163 U/L (ref 98–192)

## 2020-05-23 LAB — SAMPLE TO BLOOD BANK

## 2020-05-23 MED ORDER — SODIUM CHLORIDE 0.9% FLUSH
10.0000 mL | Freq: Once | INTRAVENOUS | Status: AC
  Administered 2020-05-23: 10 mL
  Filled 2020-05-23: qty 10

## 2020-05-23 MED ORDER — HEPARIN SOD (PORK) LOCK FLUSH 100 UNIT/ML IV SOLN
500.0000 [IU] | Freq: Once | INTRAVENOUS | Status: AC
  Administered 2020-05-23: 500 [IU]
  Filled 2020-05-23: qty 5

## 2020-05-23 NOTE — Patient Instructions (Addendum)
COVID-19: How to Protect Yourself and Others Know how it spreads  There is currently no vaccine to prevent coronavirus disease 2019 (COVID-19).  The best way to prevent illness is to avoid being exposed to this virus.  The virus is thought to spread mainly from person-to-person. ? Between people who are in close contact with one another (within about 6 feet). ? Through respiratory droplets produced when an infected person coughs, sneezes or talks. ? These droplets can land in the mouths or noses of people who are nearby or possibly be inhaled into the lungs. ? COVID-19 may be spread by people who are not showing symptoms. Everyone should Clean your hands often  Wash your hands often with soap and water for at least 20 seconds especially after you have been in a public place, or after blowing your nose, coughing, or sneezing.  If soap and water are not readily available, use a hand sanitizer that contains at least 60% alcohol. Cover all surfaces of your hands and rub them together until they feel dry.  Avoid touching your eyes, nose, and mouth with unwashed hands. Avoid close contact  Limit contact with others as much as possible.  Avoid close contact with people who are sick.  Put distance between yourself and other people. ? Remember that some people without symptoms may be able to spread virus. ? This is especially important for people who are at higher risk of getting very [employer]cy Cover your mouth and nose with a mask when around others  You could spread COVID-19 to others even if you do not feel sick.  Everyone should wear a mask in public settings and when around people not living in their household, especially when social distancing is difficult to maintain. ? Masks should not be placed on young children under age 32, anyone who has trouble breathing, or is unconscious, incapacitated or otherwise  unable to remove the mask without assistance.  The mask is meant to protect other people in case you are infected.  Do NOT use a facemask meant for a Dietitian.  Continue to keep about 6 feet between yourself and others. The mask is not a substitute for social distancing. Cover coughs and sneezes  Always cover your mouth and nose with a tissue when you cough or sneeze or use the inside of your elbow.  Throw used tissues in the trash.  Immediately wash your hands with soap and water for at least 20 seconds. If soap and water are not readily available, clean your hands with a hand sanitizer that contains at least 60% alcohol. Clean and disinfect  Clean AND disinfect frequently touched surfaces daily. This includes tables, doorknobs, light switches, countertops, handles, desks, phones, keyboards, toilets, faucets, and sinks. [employer]  If surfaces are dirty, clean them: Use detergent or soap and water prior to disinfection.  Then, use a household disinfectant. You can see a list of EPA-registered household disinfectants here. michellinders.com 06/10/2019 This information is not intended to replace advice given to you by your health care provider. Make sure you discuss any questions you have with your health care provider. Document Revised: 06/18/2019 Document Reviewed: 04/16/2019 Elsevier Patient Education  2020 [place] from [employer], [employer], or Physical Activity  Breanna Perry needs to be excused from work  This is effective for the following dates: 05/23/2020  She may return to work on 05/24/2020   Health care provider name (printed): [place] care provider (signature): _________________________________________________________  Date: 05/23/2020

## 2020-05-23 NOTE — Progress Notes (Signed)
OK to treat. Breanna Perry, MHS, PA-C   [facility]pt[facility] Progress Note   Breanna Perry [id number] [birth year] 64 y.o.  Breanna Perry is managed by Dr. Zola Button  Actively treated with chemotherapy/immunotherapy/hormonal therapy: yes  Current therapy: Vidaza  Last treated: 05/16/2020 (cycle 5, day 1)  Next scheduled appointment with provider: 06/14/2020  Assessment: Plan:    Port-A-Cath in place - Plan: heparin lock flush 100 unit/mL, sodium chloride flush (NS) 0.9 % injection 10 mL  MDS (myelodysplastic syndrome) ([place]) - Plan: heparin lock flush 100 unit/mL, sodium chloride flush (NS) 0.9 % injection 10 mL  Other specified hypotension   MDS: Breanna Perry is status post cycle 5, day 1-5 of Vidaza which was begun on 05/16/2020.  She will return to be seen in follow-up by Dr. Alen Blew on [place] 06/14/2020.  Hypotension: The patient's blood pressure returned at 86/61 today. She continues on metoprolol 25 mg once daily. She recently restarted Tribenzor. She was told to hold Tribenzor and to follow her blood pressures. She needs to contact her PCP should her blood pressure bb elevated.   Please see After Visit Summary for patient specific instructions.  Future Appointments  Date Time Provider [place]  06/06/2020  9:25 AM [facility] DEVICE REMOTES [facility]  06/14/2020  8:45 AM [facility] LAB 3 [facility] None  06/14/2020  9:00 AM [facility] [place] FLUSH [facility] None  06/14/2020  9:30 AM Wyatt Portela, MD [facility] None  06/15/2020  8:45 AM [facility] INFUSION [facility] None  06/16/2020  9:30 AM [facility] INFUSION [facility] None  06/17/2020 11:00 AM [facility] INFUSION [facility] None  06/18/2020  8:00 AM [facility] INFUSION [facility] None  07/11/2020  9:25 AM [facility] DEVICE REMOTES [facility]  08/15/2020  9:25 AM [facility] DEVICE REMOTES [facility]  09/07/2020  8:00 AM Edgardo Roys, PsyD [facility]   09/19/2020  9:25 AM [facility] DEVICE REMOTES [facility]  10/24/2020  9:25 AM [facility] DEVICE REMOTES [facility]    No orders of the defined types were placed in this encounter.      Subjective:   Patient ID:  Breanna Perry is a 64 y.o. (DOB 10/04/1956) female.  Chief Complaint:  Chief Complaint  Patient presents with  . Fatigue    HPI Breanna Perry  is a 64 y.o. female with a diagnosis of MDS.  She is managed by Dr. Alen Blew and is currently treated with Vidaza with her last cycle started on 05/16/2020.  She presents today with increased fatigue and weakness since restarting Tribenzor. She had noted that her blood pressure was elevated. She was taking metoprolol 25 mg, 1/2 tablet daily. Her metoprolol was increased to 25 mg daily. Despite this, she continued to have an elevated blood pressure. She was instructed to restart Tribenzor. She developed her current symptoms after restarting Tribenzor.   Medications: I have reviewed the patient's current medications.  Allergies: No Known Allergies  Past Medical History:  Diagnosis Date  . Anemia   . Anxiety   . Dental bridge present    upper and lower  . Depression   . Hypertension    under control with med., has been on med. x 8 yr.  . Leukocytopenia   . Medial meniscus tear 12/2012   left  . Sjogren's syndrome ([place])   . Stroke ([place])   . Wears glasses    reading    Past Surgical History:  Procedure Laterality Date  . APPENDECTOMY  04/1991  . BIOPSY  11/20/2019   Procedure: BIOPSY;  Surgeon: Ronnette Juniper, MD;  Location: [facility];  Service: Gastroenterology;;  . COLONOSCOPY    . COLONOSCOPY WITH PROPOFOL N/A 11/20/2019   Procedure: COLONOSCOPY WITH PROPOFOL;  Surgeon: Ronnette Juniper, MD;  Location: [facility];  Service: Gastroenterology;  Laterality: N/A;  . ESOPHAGOGASTRODUODENOSCOPY (EGD) WITH PROPOFOL N/A 11/20/2019   Procedure: ESOPHAGOGASTRODUODENOSCOPY (EGD) WITH PROPOFOL;  Surgeon:  Ronnette Juniper, MD;  Location: [facility];  Service: Gastroenterology;  Laterality: N/A;  . IR IMAGING GUIDED PORT INSERTION  01/19/2020  . KNEE ARTHROSCOPY Left 01/07/2013   Procedure: LEFT KNEE ARTHROSCOPY PARTIAL MEDIAL MENISECTOMY CHRONDOPLASTY MEDIAL PLICA RESECTION;  Surgeon: Alta Corning, MD;  Location: [facility];  Service: Orthopedics;  Laterality: Left;  . KNEE SURGERY Left 06/22/2015  . LIVER BIOPSY  summer 2011  . LOOP RECORDER INSERTION N/A 10/21/2018   Procedure: LOOP RECORDER INSERTION;  Surgeon: Thompson Grayer, MD;  Location: [facility];  Service: Cardiovascular;  Laterality: N/A;  . LUNG BIOPSY  summer 2011  . PARTIAL HYSTERECTOMY  04/1991   partial  . TEE WITHOUT CARDIOVERSION N/A 10/21/2018   Procedure: TRANSESOPHAGEAL ECHOCARDIOGRAM (TEE);  Surgeon: Elouise Munroe, MD;  Location: [facility];  Service: Cardiology;  Laterality: N/A;  possible loop recorder    Family History  Problem Relation Age of Onset  . Hypertension Father   . Emphysema Father   . Heart attack Father   . Cirrhosis Father   . Coronary artery disease Mother   . Heart failure Mother   . COPD Mother   . Breast cancer Maternal Grandmother   . HIV Brother   . Diabetes Sister     Social History   Socioeconomic History  . Marital status: Single    Spouse name: Not on file  . Number of children: N  . Years of education: Not on file  . Highest education level: Not on file  Occupational History  . Occupation: parts specialist [employer]   Tobacco Use  . Smoking status: Former Smoker    Packs/day: 1.00    Years: 31.00    Pack years: 31.00    Types: Cigarettes    Quit date: 10/08/2000    Years since quitting: 19.6  . Smokeless tobacco: Never Used  Vaping Use  . Vaping Use: Never used  Substance and Sexual Activity  . Alcohol use: Not Currently    Comment: rarely  . Drug use: No  . Sexual activity: Not on file  Other Topics Concern  . Not on file  Social History  Narrative  . Not on file   Social Determinants of Health   Financial Resource Strain:   . Difficulty of Paying Living Expenses:   Food Insecurity:   . Worried About Charity fundraiser in the Last Year:   . Arboriculturist in the Last Year:   Transportation Needs:   . Film/video editor (Medical):   Marland Kitchen Lack of Transportation (Non-Medical):   Physical Activity:   . Days of Exercise per Week:   . Minutes of Exercise per Session:   Stress:   . Feeling of Stress :   Social Connections:   . Frequency of Communication with Friends and Family:   . Frequency of Social Gatherings with Friends and Family:   . Attends Religious Services:   . Active Member of Clubs or Organizations:   . Attends Archivist Meetings:   Marland Kitchen Marital Status:   Intimate Partner Violence:   . Fear of Current or  Ex-Partner:   . Emotionally Abused:   Marland Kitchen Physically Abused:   . Sexually Abused:     Past Medical History, Surgical history, Social history, and Family history were reviewed and updated as appropriate.   Please see review of systems for further details on the patient's review from today.   Review of Systems:  Review of Systems  Constitutional: Positive for fatigue. Negative for appetite change, chills, diaphoresis and fever.  HENT: Negative for dental problem, mouth sores and trouble swallowing.   Respiratory: Negative for cough, chest tightness and shortness of breath.   Cardiovascular: Negative for chest pain and palpitations.  Gastrointestinal: Negative for constipation, diarrhea, nausea and vomiting.  Neurological: Positive for weakness. Negative for dizziness, syncope and headaches.    Objective:   Physical Exam:  BP (!) 86/61 (BP Location: Left Arm, Patient Position: Sitting) Comment: retake notifed nurse  Pulse 77   Temp (!) 97 F (36.1 C) (Axillary) Comment: notified nurse  Resp 18   Ht '5\' 3"'  (1.6 m)   Wt 154 lb 12.8 oz (70.2 kg)   SpO2 100%   BMI 27.42 kg/m  ECOG:  1  Physical Exam Constitutional:      General: She is not in acute distress.    Appearance: She is not diaphoretic.  HENT:     Head: Normocephalic and atraumatic.  Eyes:     General: No scleral icterus.       Right eye: No discharge.        Left eye: No discharge.     Conjunctiva/sclera: Conjunctivae normal.  Cardiovascular:     Rate and Rhythm: Normal rate and regular rhythm.     Heart sounds: Normal heart sounds. No murmur heard.  No friction rub. No gallop.   Pulmonary:     Effort: Pulmonary effort is normal. No respiratory distress.     Breath sounds: Normal breath sounds. No wheezing or rales.  Skin:    General: Skin is warm and dry.     Findings: No erythema or rash.  Neurological:     Mental Status: She is alert.     Coordination: Coordination normal.     Gait: Gait normal.  Psychiatric:        Mood and Affect: Mood normal.        Behavior: Behavior normal.        Thought Content: Thought content normal.        Judgment: Judgment normal.     Lab Review:     Component Value Date/Time   NA 141 05/16/2020 1005   NA 141 10/10/2018 1333   K 3.9 05/16/2020 1005   CL 109 05/16/2020 1005   CO2 24 05/16/2020 1005   GLUCOSE 104 (H) 05/16/2020 1005   BUN 11 05/16/2020 1005   BUN 12 10/10/2018 1333   CREATININE 0.88 05/16/2020 1005   CREATININE 1.01 03/29/2014 1428   CALCIUM 10.2 05/16/2020 1005   PROT 7.1 05/16/2020 1005   ALBUMIN 2.8 (L) 05/16/2020 1005   AST 6 (L) 05/16/2020 1005   ALT <6 05/16/2020 1005   ALKPHOS 70 05/16/2020 1005   BILITOT 0.3 05/16/2020 1005   GFRNONAA >60 05/16/2020 1005   GFRAA >60 05/16/2020 1005       Component Value Date/Time   WBC 8.5 05/23/2020 0953   WBC 1.0 (LL) 05/03/2020 0751   RBC 3.65 (L) 05/23/2020 0953   HGB 11.8 (L) 05/23/2020 0953   HGB 11.6 10/10/2018 1333   HCT 35.6 (L) 05/23/2020 1638  HCT 34.0 10/10/2018 1333   PLT 190 05/23/2020 0953   PLT 389 10/10/2018 1333   MCV 97.5 05/23/2020 0953   MCV 94 10/10/2018  1333   MCH 32.3 05/23/2020 0953   MCHC 33.1 05/23/2020 0953   RDW 19.1 (H) 05/23/2020 0953   RDW 14.7 10/10/2018 1333   LYMPHSABS 3.3 05/23/2020 0953   MONOABS 0.4 05/23/2020 0953   EOSABS 0.0 05/23/2020 0953   BASOSABS 0.0 05/23/2020 0953   -------------------------------  Imaging from last 24 hours (if applicable):  Radiology interpretation: CT BIOPSY  Result Date: 05/03/2020 INDICATION: 64 year old female with a history of myelodysplastic syndrome. She has now completed her initial therapy and presents for repeat bone marrow biopsy for restaging. EXAM: CT GUIDED BONE MARROW ASPIRATION AND CORE BIOPSY Interventional Radiologist:  Criselda Peaches, MD MEDICATIONS: None. ANESTHESIA/SEDATION: Moderate (conscious) sedation was employed during this procedure. A total of 2 milligrams versed and 100 micrograms fentanyl were administered intravenously. The patient's level of consciousness and vital signs were monitored continuously by radiology nursing throughout the procedure under my direct supervision. Total monitored sedation time: 11 minutes FLUOROSCOPY TIME:  None COMPLICATIONS: None immediate. Estimated blood loss: <25 mL PROCEDURE: Informed written consent was obtained from the patient after a thorough discussion of the procedural risks, benefits and alternatives. All questions were addressed. Maximal Sterile Barrier Technique was utilized including caps, mask, sterile gowns, sterile gloves, sterile drape, hand hygiene and skin antiseptic. A timeout was performed prior to the initiation of the procedure. The patient was positioned prone and non-contrast localization CT was performed of the pelvis to demonstrate the iliac marrow spaces. Maximal barrier sterile technique utilized including caps, mask, sterile gowns, sterile gloves, large sterile drape, hand hygiene, and betadine prep. Under sterile conditions and local anesthesia, an 11 gauge coaxial bone biopsy needle was advanced into the right  iliac marrow space. Needle position was confirmed with CT imaging. Initially, bone marrow aspiration was performed. Next, the 11 gauge outer cannula was utilized to obtain a right iliac bone marrow core biopsy. Needle was removed. Hemostasis was obtained with compression. The patient tolerated the procedure well. Samples were prepared with the cytotechnologist. IMPRESSION: Technically successful right iliac bone marrow aspiration and core biopsy. Electronically Signed   By: Jacqulynn Cadet M.D.   On: 05/03/2020 09:47   CT BONE MARROW BIOPSY & ASPIRATION  Result Date: 05/03/2020 INDICATION: 64 year old female with a history of myelodysplastic syndrome. She has now completed her initial therapy and presents for repeat bone marrow biopsy for restaging. EXAM: CT GUIDED BONE MARROW ASPIRATION AND CORE BIOPSY Interventional Radiologist:  Criselda Peaches, MD MEDICATIONS: None. ANESTHESIA/SEDATION: Moderate (conscious) sedation was employed during this procedure. A total of 2 milligrams versed and 100 micrograms fentanyl were administered intravenously. The patient's level of consciousness and vital signs were monitored continuously by radiology nursing throughout the procedure under my direct supervision. Total monitored sedation time: 11 minutes FLUOROSCOPY TIME:  None COMPLICATIONS: None immediate. Estimated blood loss: <25 mL PROCEDURE: Informed written consent was obtained from the patient after a thorough discussion of the procedural risks, benefits and alternatives. All questions were addressed. Maximal Sterile Barrier Technique was utilized including caps, mask, sterile gowns, sterile gloves, sterile drape, hand hygiene and skin antiseptic. A timeout was performed prior to the initiation of the procedure. The patient was positioned prone and non-contrast localization CT was performed of the pelvis to demonstrate the iliac marrow spaces. Maximal barrier sterile technique utilized including caps, mask,  sterile gowns, sterile gloves, large sterile  drape, hand hygiene, and betadine prep. Under sterile conditions and local anesthesia, an 11 gauge coaxial bone biopsy needle was advanced into the right iliac marrow space. Needle position was confirmed with CT imaging. Initially, bone marrow aspiration was performed. Next, the 11 gauge outer cannula was utilized to obtain a right iliac bone marrow core biopsy. Needle was removed. Hemostasis was obtained with compression. The patient tolerated the procedure well. Samples were prepared with the cytotechnologist. IMPRESSION: Technically successful right iliac bone marrow aspiration and core biopsy. Electronically Signed   By: Jacqulynn Cadet M.D.   On: 05/03/2020 09:47   CUP PACEART REMOTE DEVICE CHECK  Result Date: 05/03/2020 [facility] summary report received. Battery status OK. Normal device function. No new symptom episodes, tachy episodes, brady, or pause episodes. No new AF episodes. Monthly summary reports and ROV/PRN Kathy Breach, RN, CCDS, [employer]

## 2020-05-23 NOTE — Telephone Encounter (Signed)
Pt sent mychart message, stating she's feeling faint and requested to come in for blood work. Called pt, she stated, "I'm feeling faint, feels like my HR is high. I've been feeling like this the past few days." Per Dr.Shadad, make appt for labs and to see [facility]. [facility] will see pt. Scheduling message sent to schedule labs, flush and appt with [facility]. Pt made aware and verbalizes understanding

## 2020-05-26 ENCOUNTER — Encounter: Payer: Self-pay | Admitting: Oncology

## 2020-05-30 ENCOUNTER — Encounter: Payer: Self-pay | Admitting: Oncology

## 2020-05-30 DIAGNOSIS — Z23 Encounter for immunization: Secondary | ICD-10-CM | POA: Diagnosis not present

## 2020-05-30 DIAGNOSIS — H6123 Impacted cerumen, bilateral: Secondary | ICD-10-CM | POA: Diagnosis not present

## 2020-06-05 ENCOUNTER — Other Ambulatory Visit: Payer: Self-pay | Admitting: Oncology

## 2020-06-06 ENCOUNTER — Ambulatory Visit (INDEPENDENT_AMBULATORY_CARE_PROVIDER_SITE_OTHER): Payer: No Typology Code available for payment source | Admitting: *Deleted

## 2020-06-06 DIAGNOSIS — I639 Cerebral infarction, unspecified: Secondary | ICD-10-CM | POA: Diagnosis not present

## 2020-06-06 LAB — CUP PACEART REMOTE DEVICE CHECK
Date Time Interrogation Session: 20210827235041
Implantable Pulse Generator Implant Date: 20200114

## 2020-06-08 NOTE — Progress Notes (Signed)
[facility] Summary Report / Loop Recorder 

## 2020-06-14 ENCOUNTER — Telehealth: Payer: Self-pay | Admitting: *Deleted

## 2020-06-14 ENCOUNTER — Inpatient Hospital Stay: Payer: No Typology Code available for payment source | Attending: Oncology

## 2020-06-14 ENCOUNTER — Inpatient Hospital Stay (HOSPITAL_BASED_OUTPATIENT_CLINIC_OR_DEPARTMENT_OTHER): Payer: No Typology Code available for payment source | Admitting: Oncology

## 2020-06-14 ENCOUNTER — Other Ambulatory Visit: Payer: Self-pay

## 2020-06-14 ENCOUNTER — Inpatient Hospital Stay: Payer: No Typology Code available for payment source

## 2020-06-14 VITALS — BP 143/94 | HR 73 | Temp 98.0°F | Resp 18 | Wt 165.3 lb

## 2020-06-14 DIAGNOSIS — Z23 Encounter for immunization: Secondary | ICD-10-CM | POA: Insufficient documentation

## 2020-06-14 DIAGNOSIS — E876 Hypokalemia: Secondary | ICD-10-CM | POA: Diagnosis not present

## 2020-06-14 DIAGNOSIS — K5900 Constipation, unspecified: Secondary | ICD-10-CM | POA: Diagnosis not present

## 2020-06-14 DIAGNOSIS — D649 Anemia, unspecified: Secondary | ICD-10-CM

## 2020-06-14 DIAGNOSIS — Z95828 Presence of other vascular implants and grafts: Secondary | ICD-10-CM

## 2020-06-14 DIAGNOSIS — Z7982 Long term (current) use of aspirin: Secondary | ICD-10-CM | POA: Diagnosis not present

## 2020-06-14 DIAGNOSIS — Z452 Encounter for adjustment and management of vascular access device: Secondary | ICD-10-CM | POA: Insufficient documentation

## 2020-06-14 DIAGNOSIS — D469 Myelodysplastic syndrome, unspecified: Secondary | ICD-10-CM | POA: Insufficient documentation

## 2020-06-14 DIAGNOSIS — Z79899 Other long term (current) drug therapy: Secondary | ICD-10-CM | POA: Insufficient documentation

## 2020-06-14 DIAGNOSIS — I959 Hypotension, unspecified: Secondary | ICD-10-CM | POA: Diagnosis not present

## 2020-06-14 DIAGNOSIS — Z5111 Encounter for antineoplastic chemotherapy: Secondary | ICD-10-CM | POA: Diagnosis present

## 2020-06-14 LAB — CMP (CANCER CENTER ONLY)
ALT: 6 U/L (ref 0–44)
AST: 8 U/L — ABNORMAL LOW (ref 15–41)
Albumin: 3.2 g/dL — ABNORMAL LOW (ref 3.5–5.0)
Alkaline Phosphatase: 90 U/L (ref 38–126)
Anion gap: 3 — ABNORMAL LOW (ref 5–15)
BUN: 13 mg/dL (ref 8–23)
CO2: 27 mmol/L (ref 22–32)
Calcium: 9.6 mg/dL (ref 8.9–10.3)
Chloride: 110 mmol/L (ref 98–111)
Creatinine: 0.81 mg/dL (ref 0.44–1.00)
GFR, Est AFR Am: >60 mL/min (ref >60)
GFR, Estimated: >60 mL/min (ref >60)
Glucose, Bld: 87 mg/dL (ref 70–99)
Potassium: 4 mmol/L (ref 3.5–5.1)
Sodium: 140 mmol/L (ref 135–145)
Total Bilirubin: 0.5 mg/dL (ref 0.3–1.2)
Total Protein: 6.9 g/dL (ref 6.5–8.1)

## 2020-06-14 LAB — CBC WITH DIFFERENTIAL (CANCER CENTER ONLY)
Abs Immature Granulocytes: 0 10*3/uL (ref 0.00–0.07)
Basophils Absolute: 0 10*3/uL (ref 0.0–0.1)
Basophils Relative: 1 %
Eosinophils Absolute: 0 10*3/uL (ref 0.0–0.5)
Eosinophils Relative: 2 %
HCT: 28.7 % — ABNORMAL LOW (ref 36.0–46.0)
Hemoglobin: 9.6 g/dL — ABNORMAL LOW (ref 12.0–15.0)
Immature Granulocytes: 0 %
Lymphocytes Relative: 61 %
Lymphs Abs: 1 10*3/uL (ref 0.7–4.0)
MCH: 32.9 pg (ref 26.0–34.0)
MCHC: 33.4 g/dL (ref 30.0–36.0)
MCV: 98.3 fL (ref 80.0–100.0)
Monocytes Absolute: 0.1 10*3/uL (ref 0.1–1.0)
Monocytes Relative: 9 %
Neutro Abs: 0.4 10*3/uL — CL (ref 1.7–7.7)
Neutrophils Relative %: 27 %
Platelet Count: 180 10*3/uL (ref 150–400)
RBC: 2.92 MIL/uL — ABNORMAL LOW (ref 3.87–5.11)
RDW: 19.8 % — ABNORMAL HIGH (ref 11.5–15.5)
WBC Count: 1.6 10*3/uL — ABNORMAL LOW (ref 4.0–10.5)
nRBC: 0 % (ref 0.0–0.2)

## 2020-06-14 LAB — LACTATE DEHYDROGENASE: LDH: 165 U/L (ref 98–192)

## 2020-06-14 LAB — SAMPLE TO BLOOD BANK

## 2020-06-14 MED ORDER — HEPARIN SOD (PORK) LOCK FLUSH 100 UNIT/ML IV SOLN
500.0000 [IU] | Freq: Once | INTRAVENOUS | Status: AC
  Administered 2020-06-14: 500 [IU]
  Filled 2020-06-14: qty 5

## 2020-06-14 MED ORDER — SODIUM CHLORIDE 0.9% FLUSH
10.0000 mL | Freq: Once | INTRAVENOUS | Status: AC
  Administered 2020-06-14: 10 mL
  Filled 2020-06-14: qty 10

## 2020-06-14 NOTE — Addendum Note (Signed)
Addended by: Kennedy Bucker on: 06/14/2020 09:58 AM   Modules accepted: Orders

## 2020-06-14 NOTE — Telephone Encounter (Signed)
Received report from [facility] in [place] with critical of 0.4 ANC.  Informed nurse Maudie Mercury D since pt is being seen now by Dr Alen Blew.

## 2020-06-14 NOTE — Progress Notes (Signed)
Hematology and Oncology Follow Up Visit  Breanna Perry [id number] [birth year] 64 y.o. 06/14/2020 9:28 AM Breanna Perry, MDSmith, Breanna Hope, MD   Principle Diagnosis: 64 year old woman with IPSS-R score of 6 MDS diagnosed with anemia in February 2021.  She was found to have 14% blasts, 5 q. minus    Current therapy:   Supportive packed red cell transfusion.    Vidaza 75 mg per metered square IV started on January 18, 2020.  She is receiving 7 consecutive days every 4 weeks.  She completed 4 cycles of therapy.  He is currently receiving Vidaza on 5 consecutive days starting with cycle 5.  She is here for the start of cycle 6.   Interim History: Breanna Perry presents today for repeat evaluation.  Since the last visit, she reports had no major changes in her health.  She denies any nausea, vomiting or abdominal pain.  She denies any excessive fatigue or tiredness.  Her performance status quality of life continues to improve at this time.     Medications: Reviewed without changes. Current Outpatient Medications  Medication Sig Dispense Refill  . allopurinol (ZYLOPRIM) 300 MG tablet TAKE 1 TABLET (300 MG TOTAL) BY MOUTH 2 (TWO) TIMES DAILY. 60 tablet 0  . ARIPiprazole (ABILIFY) 5 MG tablet 5 mg.    . aspirin EC 81 MG EC tablet Take 1 tablet (81 mg total) by mouth daily.    Marland Kitchen atorvastatin (LIPITOR) 40 MG tablet Take 1 tablet (40 mg total) by mouth daily at 6 PM. (Patient taking differently: Take 40 mg by mouth daily. ) 30 tablet 2  . betamethasone dipropionate 0.05 % cream Apply topically 2 (two) times daily. Recently started (Patient not taking: Reported on 04/18/2020)    . busPIRone (BUSPAR) 10 MG tablet Take 10 mg by mouth 2 (two) times daily.    . citalopram (CELEXA) 40 MG tablet Take 40 mg by mouth daily.   1  . colesevelam (WELCHOL) 625 MG tablet Take 1,875 mg by mouth 2 (two) times daily.    . EQ GENTLE LAXATIVE 5 MG EC tablet See admin instructions.     . Eszopiclone 3 MG TABS Take 3  mg by mouth at bedtime as needed (sleep).     . hydrocortisone 2.5 % cream Apply 1 application topically 2 (two) times daily. resently started (Patient not taking: Reported on 04/18/2020)    . KLOR-CON M20 20 MEQ tablet TAKE 1 TABLET BY MOUTH TWICE A DAY 30 tablet 0  . lidocaine-prilocaine (EMLA) cream Apply 1 application topically as needed. 30 g 0  . metoprolol succinate (TOPROL-XL) 50 MG 24 hr tablet Take 25 mg by mouth.     . Olmesartan-amLODIPine-HCTZ 40-5-25 MG TABS Take 1 tablet by mouth daily.    . pantoprazole (PROTONIX) 40 MG tablet Take 1 tablet (40 mg total) by mouth daily. 60 tablet 1  . prochlorperazine (COMPAZINE) 10 MG tablet Take 1 tablet (10 mg total) by mouth every 6 (six) hours as needed for nausea or vomiting. 30 tablet 0   No current facility-administered medications for this visit.     Allergies: No Known Allergies    Physical Exam:     Blood pressure (!) 143/94, pulse 73, temperature 98 F (36.7 C), temperature source Oral, resp. rate 18, weight 165 lb 4.8 oz (75 kg), SpO2 100 %.      ECOG: 1   General appearance: Alert, awake without any distress. Head: Atraumatic without abnormalities Oropharynx: Without any thrush or ulcers. Eyes: No scleral  icterus. Lymph nodes: No lymphadenopathy noted in the cervical, supraclavicular, or axillary nodes Heart:regular rate and rhythm, without any murmurs or gallops.   Lung: Clear to auscultation without any rhonchi, wheezes or dullness to percussion. Abdomin: Soft, nontender without any shifting dullness or ascites. Musculoskeletal: No clubbing or cyanosis. Neurological: No motor or sensory deficits. Skin: No rashes or lesions.           Lab Results: Lab Results  Component Value Date   WBC 8.5 05/23/2020   HGB 11.8 (L) 05/23/2020   HCT 35.6 (L) 05/23/2020   MCV 97.5 05/23/2020   PLT 190 05/23/2020     Chemistry      Component Value Date/Time   NA 138 05/23/2020 0953   NA 141 10/10/2018 1333    K 3.4 (L) 05/23/2020 0953   CL 102 05/23/2020 0953   CO2 25 05/23/2020 0953   BUN 21 05/23/2020 0953   BUN 12 10/10/2018 1333   CREATININE 1.34 (H) 05/23/2020 0953   CREATININE 1.01 03/29/2014 1428      Component Value Date/Time   CALCIUM 10.1 05/23/2020 0953   ALKPHOS 67 05/23/2020 0953   AST 7 (L) 05/23/2020 0953   ALT 6 05/23/2020 0953   BILITOT 0.7 05/23/2020 0953       Impression and Plan:   64 year old woman with:  1.  Myelodysplastic syndrome presented with symptomatic anemia, IPSS-R score is 6 and around 14% of blasts diagnosed in February 2021.   The natural course of her disease and risks and benefits of continuing this therapy were reviewed today.  Complications including cytopenias, nausea, vomiting and constipation were reviewed.  Laboratory data today reviewed and showed low white cell count which has not recovered from her previous treatment.  We will push the start of her next cycle by 1 week.  The goal is to proceed with cycle 6 starting the week of September 13 and cycle 7 in 4 to 5 weeks after that.  2.  Anemia: Related to MDS and continues to improve with treatment.  3.  IV access: Port-A-Cath no issues reported with access and use at this time.  4.  Constipation: Manageable with the current regimen at this time.   5.  Prognosis: Aggressive measures are warranted at this time given her excellent performance status and potentially curable condition with transplant.  6.  Antiemetics: No nausea or vomiting reported at this time.  7.  Hypokalemia: She continues to be on potassium replacement and will continue to monitor periodically.   8.  Hypotension: Blood pressure is back within normal range at this time.  9.  Follow-up: She will return in 1 week for repeat laboratory testing and potential the start of cycle 6 of therapy.   30  minutes were spent this visit.  Time was dedicated to reviewing her disease status, treatment options and outlining future  plan of care.    Zola Button, MD 9/7/20219:28 AM

## 2020-06-14 NOTE — Progress Notes (Signed)
CRITICAL VALUE STICKER  CRITICAL VALUE: Neutro ABS 0.4  RECEIVER (on-site recipient of call): Meriel Flavors LPN  DATE & TIME NOTIFIED:  06/14/20 MESSENGER (representative from lab): Arlys John MD NOTIFIED:  Dr Alen Blew  TIME OF NOTIFICATION: 1015am RESPONSE:  Noted. Treatment canceled for the week.

## 2020-06-15 ENCOUNTER — Inpatient Hospital Stay: Payer: No Typology Code available for payment source

## 2020-06-16 ENCOUNTER — Encounter: Payer: Self-pay | Admitting: Oncology

## 2020-06-16 ENCOUNTER — Inpatient Hospital Stay: Payer: No Typology Code available for payment source

## 2020-06-17 ENCOUNTER — Inpatient Hospital Stay: Payer: No Typology Code available for payment source

## 2020-06-17 ENCOUNTER — Encounter: Payer: Self-pay | Admitting: Oncology

## 2020-06-17 ENCOUNTER — Telehealth: Payer: Self-pay | Admitting: Oncology

## 2020-06-17 NOTE — Telephone Encounter (Signed)
Scheduled appointments per 9/7 los. Attempted to call patient, but no answer. Left a message for the patient will all appointments details including Monday's schedule.

## 2020-06-18 ENCOUNTER — Ambulatory Visit: Payer: No Typology Code available for payment source

## 2020-06-18 ENCOUNTER — Other Ambulatory Visit: Payer: Self-pay | Admitting: Oncology

## 2020-06-20 ENCOUNTER — Other Ambulatory Visit: Payer: Self-pay | Admitting: Oncology

## 2020-06-20 ENCOUNTER — Inpatient Hospital Stay: Payer: No Typology Code available for payment source

## 2020-06-20 ENCOUNTER — Other Ambulatory Visit: Payer: Self-pay | Admitting: *Deleted

## 2020-06-20 ENCOUNTER — Telehealth: Payer: Self-pay | Admitting: *Deleted

## 2020-06-20 ENCOUNTER — Inpatient Hospital Stay (HOSPITAL_BASED_OUTPATIENT_CLINIC_OR_DEPARTMENT_OTHER): Payer: No Typology Code available for payment source | Admitting: Oncology

## 2020-06-20 ENCOUNTER — Other Ambulatory Visit: Payer: Self-pay

## 2020-06-20 VITALS — BP 154/87 | HR 68 | Temp 96.7°F | Resp 18 | Wt 167.4 lb

## 2020-06-20 DIAGNOSIS — D469 Myelodysplastic syndrome, unspecified: Secondary | ICD-10-CM

## 2020-06-20 DIAGNOSIS — D649 Anemia, unspecified: Secondary | ICD-10-CM

## 2020-06-20 DIAGNOSIS — Z95828 Presence of other vascular implants and grafts: Secondary | ICD-10-CM

## 2020-06-20 DIAGNOSIS — Z5111 Encounter for antineoplastic chemotherapy: Secondary | ICD-10-CM | POA: Diagnosis not present

## 2020-06-20 LAB — CMP (CANCER CENTER ONLY)
ALT: 8 U/L (ref 0–44)
AST: 11 U/L — ABNORMAL LOW (ref 15–41)
Albumin: 3.4 g/dL — ABNORMAL LOW (ref 3.5–5.0)
Alkaline Phosphatase: 78 U/L (ref 38–126)
Anion gap: 9 (ref 5–15)
BUN: 8 mg/dL (ref 8–23)
CO2: 23 mmol/L (ref 22–32)
Calcium: 9.7 mg/dL (ref 8.9–10.3)
Chloride: 110 mmol/L (ref 98–111)
Creatinine: 0.82 mg/dL (ref 0.44–1.00)
GFR, Est AFR Am: >60 mL/min (ref >60)
GFR, Estimated: >60 mL/min (ref >60)
Glucose, Bld: 87 mg/dL (ref 70–99)
Potassium: 3.8 mmol/L (ref 3.5–5.1)
Sodium: 142 mmol/L (ref 135–145)
Total Bilirubin: 0.5 mg/dL (ref 0.3–1.2)
Total Protein: 6.9 g/dL (ref 6.5–8.1)

## 2020-06-20 LAB — CBC WITH DIFFERENTIAL (CANCER CENTER ONLY)
Abs Immature Granulocytes: 0.01 10*3/uL (ref 0.00–0.07)
Basophils Absolute: 0 10*3/uL (ref 0.0–0.1)
Basophils Relative: 0 %
Eosinophils Absolute: 0 10*3/uL (ref 0.0–0.5)
Eosinophils Relative: 1 %
HCT: 29.7 % — ABNORMAL LOW (ref 36.0–46.0)
Hemoglobin: 9.9 g/dL — ABNORMAL LOW (ref 12.0–15.0)
Immature Granulocytes: 0 %
Lymphocytes Relative: 46 %
Lymphs Abs: 1.2 10*3/uL (ref 0.7–4.0)
MCH: 33.2 pg (ref 26.0–34.0)
MCHC: 33.3 g/dL (ref 30.0–36.0)
MCV: 99.7 fL (ref 80.0–100.0)
Monocytes Absolute: 0.6 10*3/uL (ref 0.1–1.0)
Monocytes Relative: 22 %
Neutro Abs: 0.8 10*3/uL — ABNORMAL LOW (ref 1.7–7.7)
Neutrophils Relative %: 31 %
Platelet Count: 150 10*3/uL (ref 150–400)
RBC: 2.98 MIL/uL — ABNORMAL LOW (ref 3.87–5.11)
RDW: 19.4 % — ABNORMAL HIGH (ref 11.5–15.5)
WBC Count: 2.6 10*3/uL — ABNORMAL LOW (ref 4.0–10.5)
nRBC: 0 % (ref 0.0–0.2)

## 2020-06-20 LAB — SAMPLE TO BLOOD BANK

## 2020-06-20 LAB — LACTATE DEHYDROGENASE: LDH: 176 U/L (ref 98–192)

## 2020-06-20 IMAGING — CT CT HEAD CODE STROKE
4 series · 16 of 47 positions shown, 18 images · non-contrast
Comparison: MRI 04/19/2014

CLINICAL DATA: Code stroke.  Altered level of consciousness

EXAM:
CT HEAD WITHOUT CONTRAST
TECHNIQUE: Contiguous axial images were obtained from the base of the skull
through the vertex without intravenous contrast.

[Series 3: head wo · axial · 0.45mm/px · z∈[-110,-5]mm · 7 of 29 slices shown, 9 images]
[im 4/29  brain]
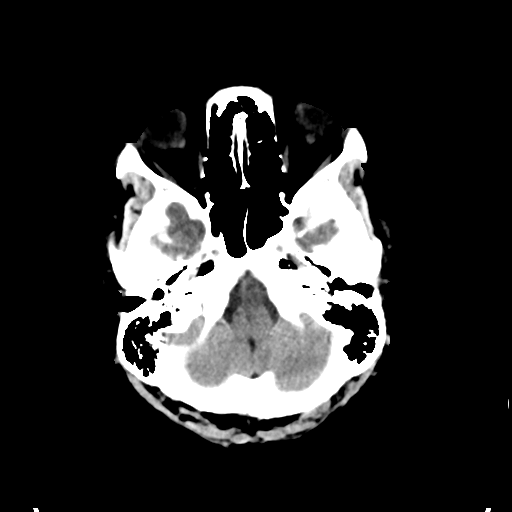
[im 4/29  bone]
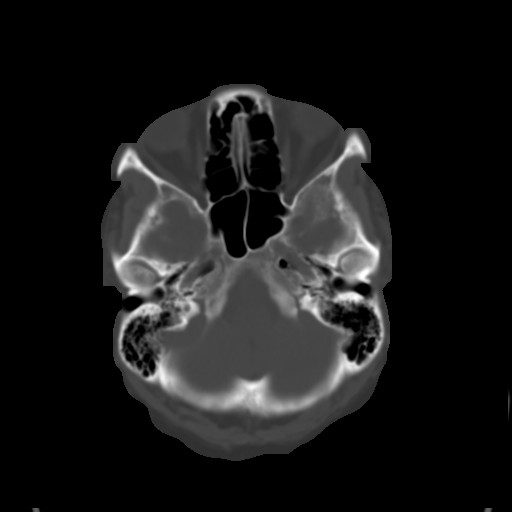
[im 8/29  brain]
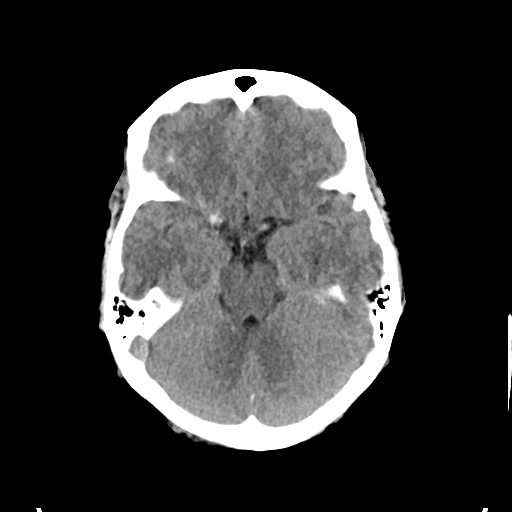
[im 11/29  brain]
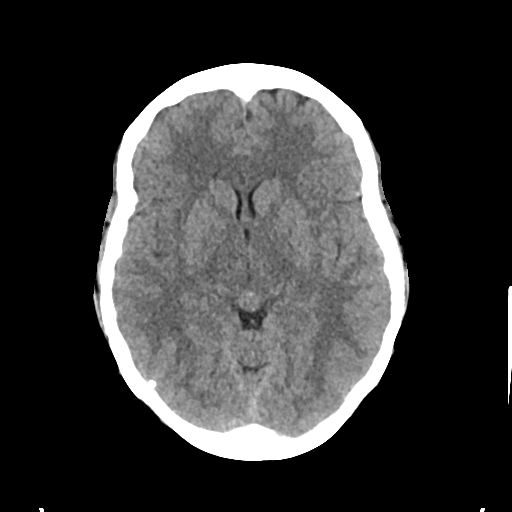
[im 15/29  brain]
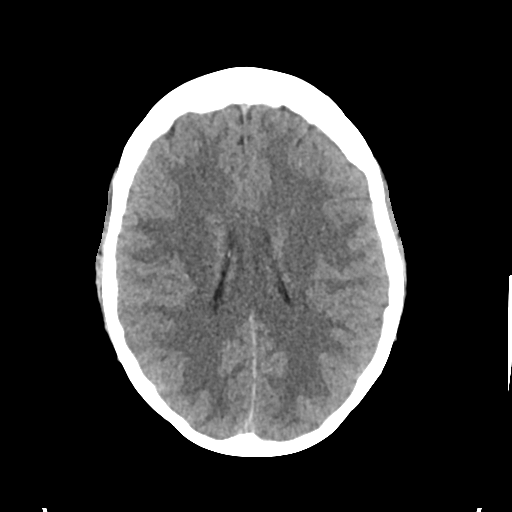
[im 18/29  brain]
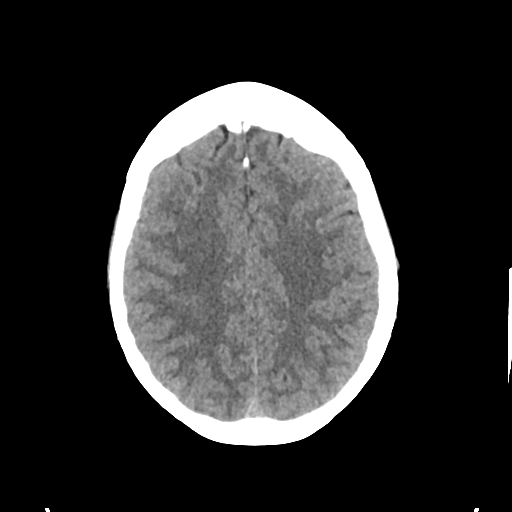
[im 18/29  bone]
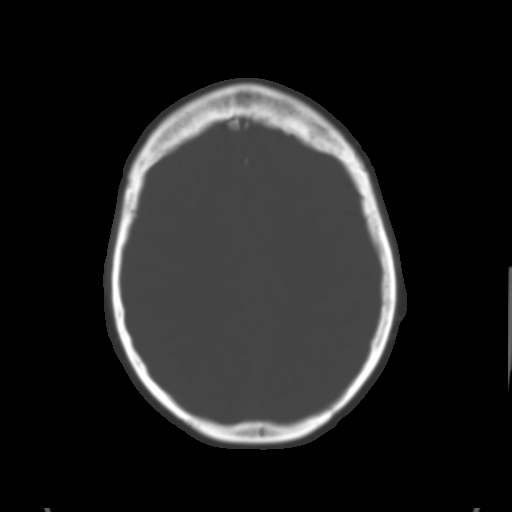
[im 22/29  brain]
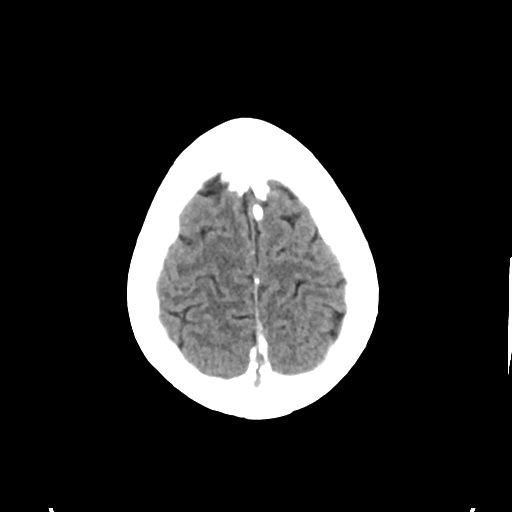
[im 25/29  brain]
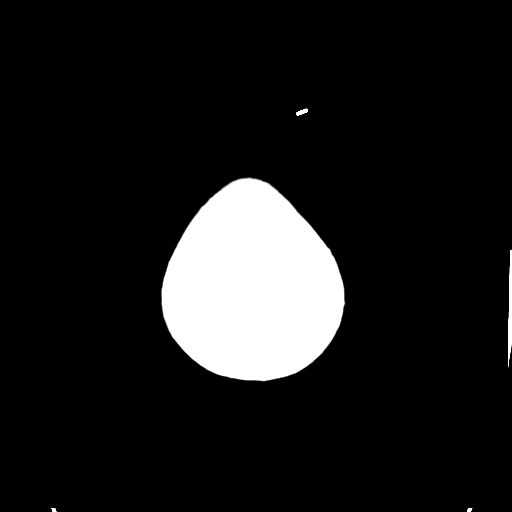

[Series 4: head bone · axial · 0.45mm/px · z∈[-111,-83]mm · 3 of 71 slices shown]
[im 8/71  bone]
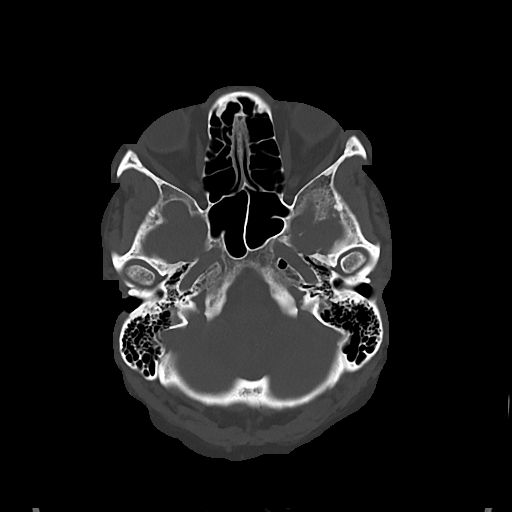
[im 15/71  bone]
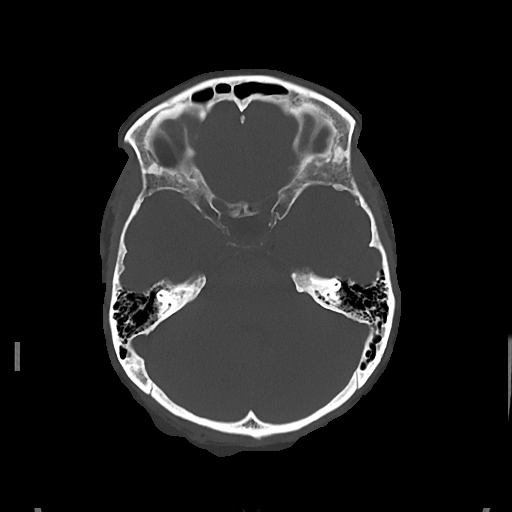
[im 22/71  bone]
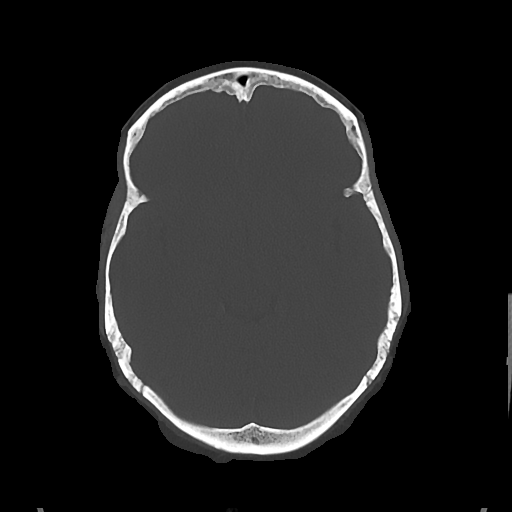

[Series 5: cor soft · coronal · 0.30mm/px · 3 of 79 slices shown]
[im 27/79  brain]
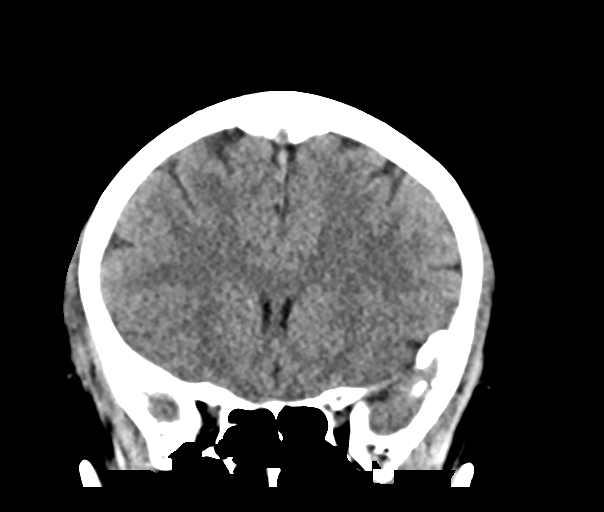
[im 35/79  brain]
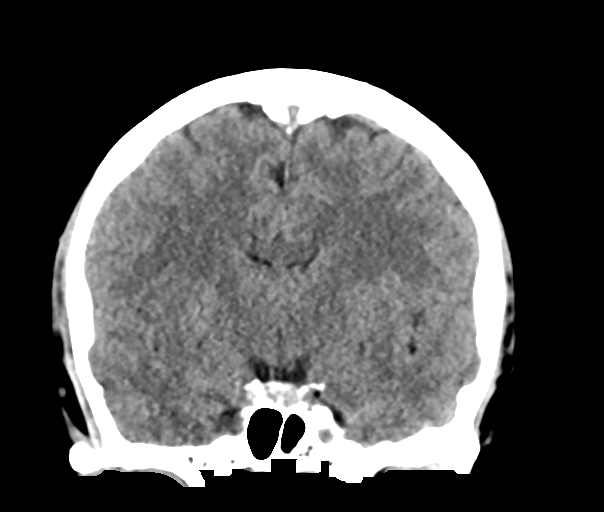
[im 44/79  brain]
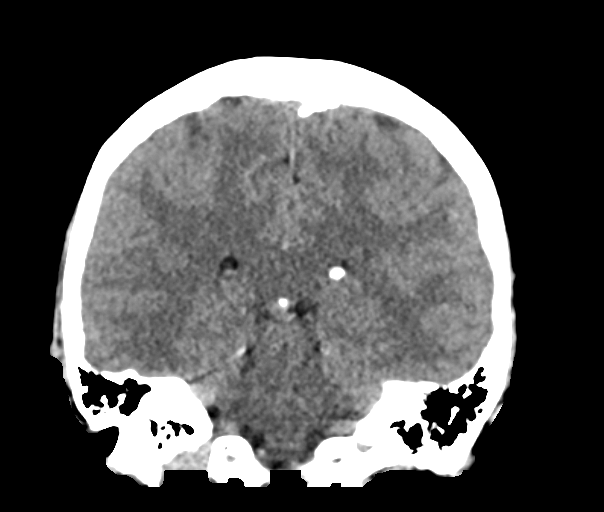

[Series 6: sag soft · sagittal · 0.33mm/px · 3 of 67 slices shown]
[im 23/67  brain]
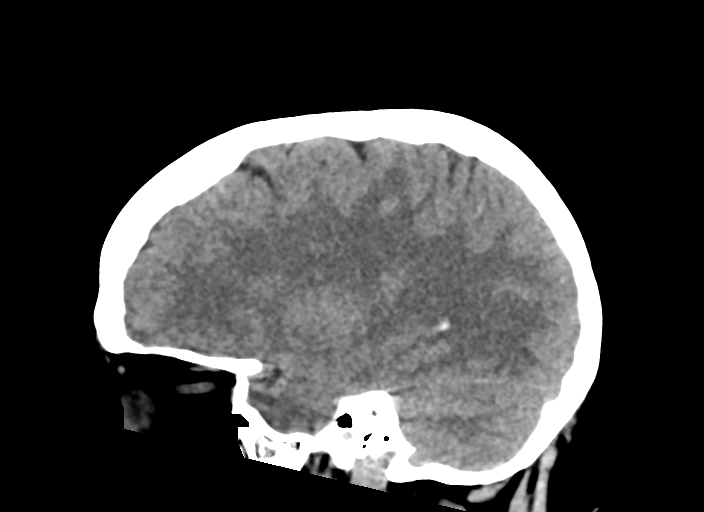
[im 34/67  brain]
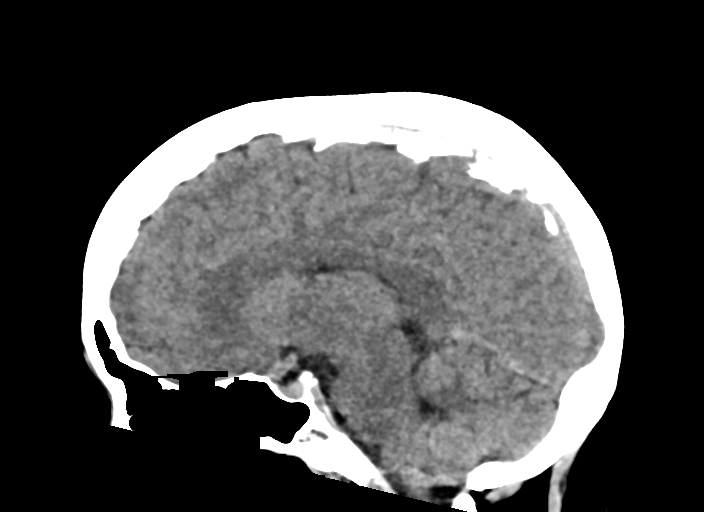
[im 45/67  brain]
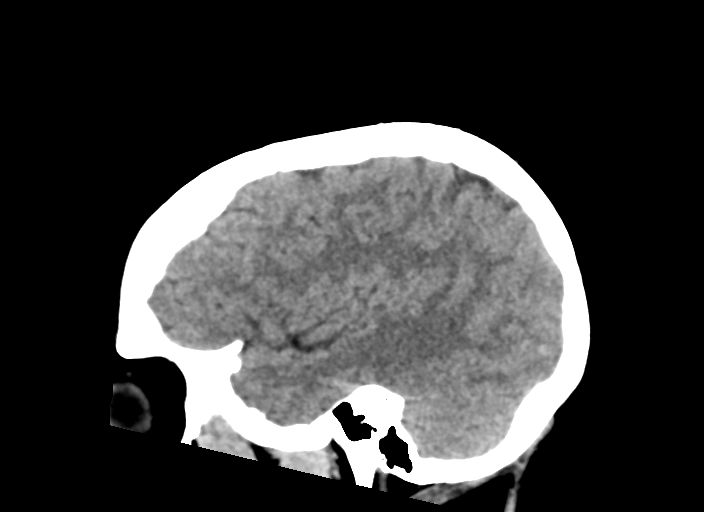

[16 of 47 positions shown; findings below may reference images not displayed]

FINDINGS: Brain: No evidence of acute infarction, hemorrhage, hydrocephalus,
extra-axial collection or mass lesion/mass effect.

Vascular: Negative for hyperdense vessel

Skull: Negative

Sinuses/Orbits: Negative

Other: None

ASPECTS (Alberta Stroke Program Early CT Score)

- Ganglionic level infarction (caudate, lentiform nuclei, internal
capsule, insula, M1-M3 cortex): 7

- Supraganglionic infarction (M4-M6 cortex): 3

Total score (0-10 with 10 being normal): 10
IMPRESSION: 1. Negative CT head
2. ASPECTS is 10
3. These results were called by telephone at the time of
interpretation on 09/29/2018 at [DATE] to Dr. Timo , who verbally
acknowledged these results.

## 2020-06-20 MED ORDER — SODIUM CHLORIDE 0.9 % IV SOLN
Freq: Once | INTRAVENOUS | Status: AC
  Filled 2020-06-20: qty 250

## 2020-06-20 MED ORDER — PALONOSETRON HCL INJECTION 0.25 MG/5ML
0.2500 mg | Freq: Once | INTRAVENOUS | Status: AC
  Administered 2020-06-20: 0.25 mg via INTRAVENOUS

## 2020-06-20 MED ORDER — HEPARIN SOD (PORK) LOCK FLUSH 100 UNIT/ML IV SOLN
500.0000 [IU] | Freq: Once | INTRAVENOUS | Status: AC | PRN
  Administered 2020-06-20: 500 [IU]
  Filled 2020-06-20: qty 5

## 2020-06-20 MED ORDER — SODIUM CHLORIDE 0.9 % IV SOLN
10.0000 mg | Freq: Once | INTRAVENOUS | Status: AC
  Administered 2020-06-20: 10 mg via INTRAVENOUS
  Filled 2020-06-20: qty 10
  Filled 2020-06-20: qty 1

## 2020-06-20 MED ORDER — SODIUM CHLORIDE 0.9 % IV SOLN
75.0000 mg/m2 | Freq: Once | INTRAVENOUS | Status: AC
  Administered 2020-06-20: 135 mg via INTRAVENOUS
  Filled 2020-06-20: qty 13.5

## 2020-06-20 MED ORDER — PALONOSETRON HCL INJECTION 0.25 MG/5ML
INTRAVENOUS | Status: AC
  Filled 2020-06-20: qty 5

## 2020-06-20 MED ORDER — SODIUM CHLORIDE 0.9% FLUSH
10.0000 mL | Freq: Once | INTRAVENOUS | Status: AC
  Administered 2020-06-20: 10 mL
  Filled 2020-06-20: qty 10

## 2020-06-20 MED ORDER — SODIUM CHLORIDE 0.9% FLUSH
10.0000 mL | INTRAVENOUS | Status: DC | PRN
  Administered 2020-06-20: 10 mL
  Filled 2020-06-20: qty 10

## 2020-06-20 NOTE — Patient Instructions (Signed)

## 2020-06-20 NOTE — Progress Notes (Signed)
Hematology and Oncology Follow Up Visit  Breanna Perry [id number] [birth year] 64 y.o. 06/20/2020 8:03 AM Breanna Perry, MDSmith, Breanna Hope, MD   Principle Diagnosis: 64 year old woman with MDS diagnosed in February 2021.  She was found to have IPSS-R score of 6, 14% blasts, 5 q -and symptomatic anemia.   Current therapy:   Supportive packed red cell transfusion.    Vidaza 75 mg per metered square IV started on January 18, 2020.  She is receiving 7 consecutive days every 4 weeks.  She completed 4 cycles of therapy.  He is currently receiving Vidaza on 5 consecutive days starting with cycle 5.  She is here for cycle 6.  This was delayed because of neutropenia.   Interim History: Breanna Perry returns today for a follow-up visit.  Since the last visit, she reports no major changes in her health.  She denies any nausea, vomiting or abdominal pain.  She denies any recent hospitalizations or illnesses.  He denies any fevers or failure to thrive.  Continues to be active and attends to activities of daily living.     Medications: Unchanged on review. Current Outpatient Medications  Medication Sig Dispense Refill  . allopurinol (ZYLOPRIM) 300 MG tablet TAKE 1 TABLET (300 MG TOTAL) BY MOUTH 2 (TWO) TIMES DAILY. 60 tablet 0  . ARIPiprazole (ABILIFY) 5 MG tablet 5 mg.    . aspirin EC 81 MG EC tablet Take 1 tablet (81 mg total) by mouth daily.    Marland Kitchen atorvastatin (LIPITOR) 40 MG tablet Take 1 tablet (40 mg total) by mouth daily at 6 PM. (Patient taking differently: Take 40 mg by mouth daily. ) 30 tablet 2  . betamethasone dipropionate 0.05 % cream Apply topically 2 (two) times daily. Recently started (Patient not taking: Reported on 04/18/2020)    . busPIRone (BUSPAR) 10 MG tablet Take 10 mg by mouth 2 (two) times daily.    . citalopram (CELEXA) 40 MG tablet Take 40 mg by mouth daily.   1  . colesevelam (WELCHOL) 625 MG tablet Take 1,875 mg by mouth 2 (two) times daily.    . EQ GENTLE LAXATIVE 5 MG  EC tablet See admin instructions.     . Eszopiclone 3 MG TABS Take 3 mg by mouth at bedtime as needed (sleep).     . hydrocortisone 2.5 % cream Apply 1 application topically 2 (two) times daily. resently started (Patient not taking: Reported on 04/18/2020)    . KLOR-CON M20 20 MEQ tablet TAKE 1 TABLET BY MOUTH TWICE A DAY 30 tablet 0  . lidocaine-prilocaine (EMLA) cream Apply 1 application topically as needed. 30 g 0  . metoprolol succinate (TOPROL-XL) 50 MG 24 hr tablet Take 25 mg by mouth.     . Olmesartan-amLODIPine-HCTZ 40-5-25 MG TABS Take 1 tablet by mouth daily.    . pantoprazole (PROTONIX) 40 MG tablet Take 1 tablet (40 mg total) by mouth daily. 60 tablet 1  . prochlorperazine (COMPAZINE) 10 MG tablet Take 1 tablet (10 mg total) by mouth every 6 (six) hours as needed for nausea or vomiting. 30 tablet 0   No current facility-administered medications for this visit.     Allergies: No Known Allergies    Physical Exam:           ECOG: 1    General appearance: Comfortable appearing without any discomfort Head: Normocephalic without any trauma Oropharynx: Mucous membranes are moist and pink without any thrush or ulcers. Eyes: Pupils are equal and round reactive to light. Lymph  nodes: No cervical, supraclavicular, inguinal or axillary lymphadenopathy.   Heart:regular rate and rhythm.  S1 and S2 without leg edema. Lung: Clear without any rhonchi or wheezes.  No dullness to percussion. Abdomin: Soft, nontender, nondistended with good bowel sounds.  No hepatosplenomegaly. Musculoskeletal: No joint deformity or effusion.  Full range of motion noted. Neurological: No deficits noted on motor, sensory and deep tendon reflex exam. Skin: No petechial rash or dryness.  Appeared moist.             Lab Results: Lab Results  Component Value Date   WBC 1.6 (L) 06/14/2020   HGB 9.6 (L) 06/14/2020   HCT 28.7 (L) 06/14/2020   MCV 98.3 06/14/2020   PLT 180 06/14/2020      Chemistry      Component Value Date/Time   NA 140 06/14/2020 0920   NA 141 10/10/2018 1333   K 4.0 06/14/2020 0920   CL 110 06/14/2020 0920   CO2 27 06/14/2020 0920   BUN 13 06/14/2020 0920   BUN 12 10/10/2018 1333   CREATININE 0.81 06/14/2020 0920   CREATININE 1.01 03/29/2014 1428      Component Value Date/Time   CALCIUM 9.6 06/14/2020 0920   ALKPHOS 90 06/14/2020 0920   AST 8 (L) 06/14/2020 0920   ALT 6 06/14/2020 0920   BILITOT 0.5 06/14/2020 0920       Impression and Plan:   64 year old woman with:  1.  MDS diagnosed in February 2021.  She was found to have  symptomatic anemia, IPSS-R score is 6 and around 14% of blasts in the bone marrow.  She is currently on Vidaza which she has tolerated reasonably well with excellent response on her transfusion need as well as blast count in the bone marrow.  Cycle 6 of therapy was delayed because of neutropenia.  Risks and benefits of proceeding with therapy today were reviewed.  Evaluation for stem cell transplant possibly ongoing to consolidate her response.  She has follow-up with Breanna Perry at [facility] this week. Her white cell count has improved at this time.  She is agreeable to proceed at this time.  2.  Anemia: Improved since she has been on 5 days and has not required any transfusion.  Hemoglobin is 9.9.   3.  IV access: Port-A-Cath remains in place without any complications.  4.  Constipation: She has appropriate bowel regimen without any issues with constipation.   5.  Prognosis: Her disease can be cured with stem cell transplant.  Aggressive measures are warranted given her age and performance status..  6.  Antiemetics: Antiemetics are available to her without any nausea or vomiting.  7.  Hypokalemia: Corrected at this time with potassium normal on 06/14/2020.  8.  Hypotension: Resolved and mildly elevated blood pressure today.  9.  Follow-up: She will return in 1 week for repeat laboratory  testing and potential the start of cycle 6 of therapy.   30  minutes were dedicated to this encounter.  The time was spent on reviewing her disease status, reviewing data and addressing treatment options and complications related to therapy.    Zola Button, MD 9/13/20218:03 AM

## 2020-06-20 NOTE — Telephone Encounter (Signed)
Per Dr.Shadad, OK to treat, in spite of ANC. Infusion nurse made aware

## 2020-06-20 NOTE — Patient Instructions (Signed)
[facility] Discharge Instructions for Patients Receiving Chemotherapy  Today you received the following chemotherapy agent: Vidaza  To help prevent nausea and vomiting after your treatment, we encourage you to take your nausea medication as directed by your MD.   If you develop nausea and vomiting that is not controlled by your nausea medication, call the clinic.   BELOW ARE SYMPTOMS THAT SHOULD BE REPORTED IMMEDIATELY:  *FEVER GREATER THAN 100.5 F  *CHILLS WITH OR WITHOUT FEVER  NAUSEA AND VOMITING THAT IS NOT CONTROLLED WITH YOUR NAUSEA MEDICATION  *UNUSUAL SHORTNESS OF BREATH  *UNUSUAL BRUISING OR BLEEDING  TENDERNESS IN MOUTH AND THROAT WITH OR WITHOUT PRESENCE OF ULCERS  *URINARY PROBLEMS  *BOWEL PROBLEMS  UNUSUAL RASH Items with * indicate a potential emergency and should be followed up as soon as possible.  Feel free to call the clinic should you have any questions or concerns. The clinic phone number is [phone].  Please show the CHEMO ALERT CARD at check-in to the Emergency Department and triage nurse.   

## 2020-06-21 ENCOUNTER — Inpatient Hospital Stay: Payer: No Typology Code available for payment source

## 2020-06-21 VITALS — BP 143/86 | HR 66 | Temp 98.7°F | Resp 16

## 2020-06-21 DIAGNOSIS — Z23 Encounter for immunization: Secondary | ICD-10-CM

## 2020-06-21 DIAGNOSIS — D469 Myelodysplastic syndrome, unspecified: Secondary | ICD-10-CM

## 2020-06-21 DIAGNOSIS — Z5111 Encounter for antineoplastic chemotherapy: Secondary | ICD-10-CM | POA: Diagnosis not present

## 2020-06-21 MED ORDER — HEPARIN SOD (PORK) LOCK FLUSH 100 UNIT/ML IV SOLN
500.0000 [IU] | Freq: Once | INTRAVENOUS | Status: AC | PRN
  Administered 2020-06-21: 500 [IU]
  Filled 2020-06-21: qty 5

## 2020-06-21 MED ORDER — SODIUM CHLORIDE 0.9 % IV SOLN
10.0000 mg | Freq: Once | INTRAVENOUS | Status: AC
  Administered 2020-06-21: 10 mg via INTRAVENOUS
  Filled 2020-06-21: qty 10

## 2020-06-21 MED ORDER — INFLUENZA VAC SPLIT QUAD 0.5 ML IM SUSY
0.5000 mL | PREFILLED_SYRINGE | Freq: Once | INTRAMUSCULAR | Status: AC
  Administered 2020-06-21: 0.5 mL via INTRAMUSCULAR

## 2020-06-21 MED ORDER — SODIUM CHLORIDE 0.9% FLUSH
10.0000 mL | INTRAVENOUS | Status: DC | PRN
  Administered 2020-06-21: 10 mL
  Filled 2020-06-21: qty 10

## 2020-06-21 MED ORDER — SODIUM CHLORIDE 0.9 % IV SOLN
75.0000 mg/m2 | Freq: Once | INTRAVENOUS | Status: AC
  Administered 2020-06-21: 135 mg via INTRAVENOUS
  Filled 2020-06-21: qty 13.5

## 2020-06-21 MED ORDER — SODIUM CHLORIDE 0.9 % IV SOLN
Freq: Once | INTRAVENOUS | Status: AC
  Filled 2020-06-21: qty 250

## 2020-06-21 MED ORDER — INFLUENZA VAC SPLIT QUAD 0.5 ML IM SUSY
PREFILLED_SYRINGE | INTRAMUSCULAR | Status: AC
  Filled 2020-06-21: qty 0.5

## 2020-06-21 NOTE — Patient Instructions (Signed)
[facility] Discharge Instructions for Patients Receiving Chemotherapy  Today you received the following chemotherapy agent: Vidaza  To help prevent nausea and vomiting after your treatment, we encourage you to take your nausea medication as directed by your MD.   If you develop nausea and vomiting that is not controlled by your nausea medication, call the clinic.   BELOW ARE SYMPTOMS THAT SHOULD BE REPORTED IMMEDIATELY:  *FEVER GREATER THAN 100.5 F  *CHILLS WITH OR WITHOUT FEVER  NAUSEA AND VOMITING THAT IS NOT CONTROLLED WITH YOUR NAUSEA MEDICATION  *UNUSUAL SHORTNESS OF BREATH  *UNUSUAL BRUISING OR BLEEDING  TENDERNESS IN MOUTH AND THROAT WITH OR WITHOUT PRESENCE OF ULCERS  *URINARY PROBLEMS  *BOWEL PROBLEMS  UNUSUAL RASH Items with * indicate a potential emergency and should be followed up as soon as possible.  Feel free to call the clinic should you have any questions or concerns. The clinic phone number is [phone].  Please show the CHEMO ALERT CARD at check-in to the Emergency Department and triage nurse.   

## 2020-06-22 ENCOUNTER — Other Ambulatory Visit: Payer: Self-pay

## 2020-06-22 ENCOUNTER — Inpatient Hospital Stay: Payer: No Typology Code available for payment source

## 2020-06-22 VITALS — BP 156/89 | HR 66 | Temp 98.4°F | Resp 18

## 2020-06-22 DIAGNOSIS — Z5111 Encounter for antineoplastic chemotherapy: Secondary | ICD-10-CM | POA: Diagnosis not present

## 2020-06-22 DIAGNOSIS — D469 Myelodysplastic syndrome, unspecified: Secondary | ICD-10-CM

## 2020-06-22 MED ORDER — SODIUM CHLORIDE 0.9 % IV SOLN
75.0000 mg/m2 | Freq: Once | INTRAVENOUS | Status: AC
  Administered 2020-06-22: 135 mg via INTRAVENOUS
  Filled 2020-06-22: qty 13.5

## 2020-06-22 MED ORDER — SODIUM CHLORIDE 0.9 % IV SOLN
Freq: Once | INTRAVENOUS | Status: AC
  Filled 2020-06-22: qty 250

## 2020-06-22 MED ORDER — SODIUM CHLORIDE 0.9% FLUSH
10.0000 mL | INTRAVENOUS | Status: DC | PRN
  Administered 2020-06-22: 10 mL
  Filled 2020-06-22: qty 10

## 2020-06-22 MED ORDER — PALONOSETRON HCL INJECTION 0.25 MG/5ML
INTRAVENOUS | Status: AC
  Filled 2020-06-22: qty 5

## 2020-06-22 MED ORDER — SODIUM CHLORIDE 0.9 % IV SOLN
10.0000 mg | Freq: Once | INTRAVENOUS | Status: AC
  Administered 2020-06-22: 10 mg via INTRAVENOUS
  Filled 2020-06-22: qty 10

## 2020-06-22 MED ORDER — HEPARIN SOD (PORK) LOCK FLUSH 100 UNIT/ML IV SOLN
500.0000 [IU] | Freq: Once | INTRAVENOUS | Status: AC | PRN
  Administered 2020-06-22: 500 [IU]
  Filled 2020-06-22: qty 5

## 2020-06-22 MED ORDER — PALONOSETRON HCL INJECTION 0.25 MG/5ML
0.2500 mg | Freq: Once | INTRAVENOUS | Status: AC
  Administered 2020-06-22: 0.25 mg via INTRAVENOUS

## 2020-06-22 NOTE — Patient Instructions (Signed)
[facility] Discharge Instructions for Patients Receiving Chemotherapy  Today you received the following chemotherapy agents Azacitidine (VIDAZA).  To help prevent nausea and vomiting after your treatment, we encourage you to take your nausea medication as prescribed.   If you develop nausea and vomiting that is not controlled by your nausea medication, call the clinic.   BELOW ARE SYMPTOMS THAT SHOULD BE REPORTED IMMEDIATELY:  *FEVER GREATER THAN 100.5 F  *CHILLS WITH OR WITHOUT FEVER  NAUSEA AND VOMITING THAT IS NOT CONTROLLED WITH YOUR NAUSEA MEDICATION  *UNUSUAL SHORTNESS OF BREATH  *UNUSUAL BRUISING OR BLEEDING  TENDERNESS IN MOUTH AND THROAT WITH OR WITHOUT PRESENCE OF ULCERS  *URINARY PROBLEMS  *BOWEL PROBLEMS  UNUSUAL RASH Items with * indicate a potential emergency and should be followed up as soon as possible.  Feel free to call the clinic should you have any questions or concerns. The clinic phone number is [phone].  Please show the CHEMO ALERT CARD at check-in to the Emergency Department and triage nurse.   

## 2020-06-23 ENCOUNTER — Other Ambulatory Visit: Payer: Self-pay

## 2020-06-23 ENCOUNTER — Inpatient Hospital Stay: Payer: No Typology Code available for payment source

## 2020-06-23 VITALS — BP 154/78 | HR 67 | Temp 98.1°F | Resp 17

## 2020-06-23 DIAGNOSIS — E876 Hypokalemia: Secondary | ICD-10-CM | POA: Diagnosis not present

## 2020-06-23 DIAGNOSIS — K5900 Constipation, unspecified: Secondary | ICD-10-CM | POA: Diagnosis not present

## 2020-06-23 DIAGNOSIS — D469 Myelodysplastic syndrome, unspecified: Secondary | ICD-10-CM

## 2020-06-23 DIAGNOSIS — Z23 Encounter for immunization: Secondary | ICD-10-CM | POA: Diagnosis not present

## 2020-06-23 DIAGNOSIS — Z79899 Other long term (current) drug therapy: Secondary | ICD-10-CM | POA: Diagnosis not present

## 2020-06-23 DIAGNOSIS — I959 Hypotension, unspecified: Secondary | ICD-10-CM | POA: Diagnosis not present

## 2020-06-23 DIAGNOSIS — Z7982 Long term (current) use of aspirin: Secondary | ICD-10-CM | POA: Diagnosis not present

## 2020-06-23 DIAGNOSIS — Z452 Encounter for adjustment and management of vascular access device: Secondary | ICD-10-CM | POA: Diagnosis not present

## 2020-06-23 DIAGNOSIS — Z5111 Encounter for antineoplastic chemotherapy: Secondary | ICD-10-CM | POA: Diagnosis not present

## 2020-06-23 MED ORDER — SODIUM CHLORIDE 0.9 % IV SOLN
10.0000 mg | Freq: Once | INTRAVENOUS | Status: AC
  Administered 2020-06-23: 10 mg via INTRAVENOUS
  Filled 2020-06-23: qty 10

## 2020-06-23 MED ORDER — SODIUM CHLORIDE 0.9 % IV SOLN
75.0000 mg/m2 | Freq: Once | INTRAVENOUS | Status: AC
  Administered 2020-06-23: 135 mg via INTRAVENOUS
  Filled 2020-06-23: qty 13.5

## 2020-06-23 MED ORDER — SODIUM CHLORIDE 0.9 % IV SOLN
Freq: Once | INTRAVENOUS | Status: AC
  Filled 2020-06-23: qty 250

## 2020-06-23 MED ORDER — HEPARIN SOD (PORK) LOCK FLUSH 100 UNIT/ML IV SOLN
500.0000 [IU] | Freq: Once | INTRAVENOUS | Status: AC | PRN
  Administered 2020-06-23: 500 [IU]
  Filled 2020-06-23: qty 5

## 2020-06-23 MED ORDER — SODIUM CHLORIDE 0.9% FLUSH
10.0000 mL | INTRAVENOUS | Status: DC | PRN
  Administered 2020-06-23: 10 mL
  Filled 2020-06-23: qty 10

## 2020-06-23 NOTE — Patient Instructions (Signed)
[facility] Discharge Instructions for Patients Receiving Chemotherapy  Today you received the following chemotherapy agents Vidaza  To help prevent nausea and vomiting after your treatment, we encourage you to take your nausea medication as directed  If you develop nausea and vomiting that is not controlled by your nausea medication, call the clinic.   BELOW ARE SYMPTOMS THAT SHOULD BE REPORTED IMMEDIATELY:  *FEVER GREATER THAN 100.5 F  *CHILLS WITH OR WITHOUT FEVER  NAUSEA AND VOMITING THAT IS NOT CONTROLLED WITH YOUR NAUSEA MEDICATION  *UNUSUAL SHORTNESS OF BREATH  *UNUSUAL BRUISING OR BLEEDING  TENDERNESS IN MOUTH AND THROAT WITH OR WITHOUT PRESENCE OF ULCERS  *URINARY PROBLEMS  *BOWEL PROBLEMS  UNUSUAL RASH Items with * indicate a potential emergency and should be followed up as soon as possible.  Feel free to call the clinic should you have any questions or concerns. The clinic phone number is [phone].  Please show the CHEMO ALERT CARD at check-in to the Emergency Department and triage nurse.   

## 2020-06-24 ENCOUNTER — Ambulatory Visit: Payer: No Typology Code available for payment source

## 2020-06-24 ENCOUNTER — Telehealth: Payer: Self-pay | Admitting: Oncology

## 2020-06-24 DIAGNOSIS — M3500 Sicca syndrome, unspecified: Secondary | ICD-10-CM | POA: Diagnosis not present

## 2020-06-24 DIAGNOSIS — E785 Hyperlipidemia, unspecified: Secondary | ICD-10-CM | POA: Diagnosis not present

## 2020-06-24 DIAGNOSIS — D46Z Other myelodysplastic syndromes: Secondary | ICD-10-CM | POA: Diagnosis not present

## 2020-06-24 DIAGNOSIS — I10 Essential (primary) hypertension: Secondary | ICD-10-CM | POA: Diagnosis not present

## 2020-06-24 NOTE — Telephone Encounter (Signed)
Scheduled per 09/13 los, patient has been called and voicemail was left. 

## 2020-06-27 ENCOUNTER — Other Ambulatory Visit: Payer: Self-pay

## 2020-06-27 ENCOUNTER — Inpatient Hospital Stay: Payer: No Typology Code available for payment source

## 2020-06-27 VITALS — BP 125/92 | HR 86 | Temp 98.2°F | Resp 16

## 2020-06-27 DIAGNOSIS — Z5111 Encounter for antineoplastic chemotherapy: Secondary | ICD-10-CM | POA: Diagnosis not present

## 2020-06-27 DIAGNOSIS — D469 Myelodysplastic syndrome, unspecified: Secondary | ICD-10-CM

## 2020-06-27 MED ORDER — PALONOSETRON HCL INJECTION 0.25 MG/5ML
INTRAVENOUS | Status: AC
  Filled 2020-06-27: qty 5

## 2020-06-27 MED ORDER — PALONOSETRON HCL INJECTION 0.25 MG/5ML
0.2500 mg | Freq: Once | INTRAVENOUS | Status: AC
  Administered 2020-06-27: 0.25 mg via INTRAVENOUS

## 2020-06-27 MED ORDER — SODIUM CHLORIDE 0.9 % IV SOLN
75.0000 mg/m2 | Freq: Once | INTRAVENOUS | Status: AC
  Administered 2020-06-27: 135 mg via INTRAVENOUS
  Filled 2020-06-27: qty 13.5

## 2020-06-27 MED ORDER — SODIUM CHLORIDE 0.9% FLUSH
10.0000 mL | INTRAVENOUS | Status: DC | PRN
  Administered 2020-06-27: 10 mL
  Filled 2020-06-27: qty 10

## 2020-06-27 MED ORDER — SODIUM CHLORIDE 0.9 % IV SOLN
Freq: Once | INTRAVENOUS | Status: AC
  Filled 2020-06-27: qty 250

## 2020-06-27 MED ORDER — SODIUM CHLORIDE 0.9 % IV SOLN
10.0000 mg | Freq: Once | INTRAVENOUS | Status: AC
  Administered 2020-06-27: 10 mg via INTRAVENOUS
  Filled 2020-06-27: qty 10

## 2020-06-27 MED ORDER — HEPARIN SOD (PORK) LOCK FLUSH 100 UNIT/ML IV SOLN
500.0000 [IU] | Freq: Once | INTRAVENOUS | Status: AC | PRN
  Administered 2020-06-27: 500 [IU]
  Filled 2020-06-27: qty 5

## 2020-06-27 NOTE — Patient Instructions (Signed)
[facility] Discharge Instructions for Patients Receiving Chemotherapy  Today you received the following chemotherapy agents azacitidine (Vidaza)   To help prevent nausea and vomiting after your treatment, we encourage you to take your nausea medication as directed.    If you develop nausea and vomiting that is not controlled by your nausea medication, call the clinic.   BELOW ARE SYMPTOMS THAT SHOULD BE REPORTED IMMEDIATELY:  *FEVER GREATER THAN 100.5 F  *CHILLS WITH OR WITHOUT FEVER  NAUSEA AND VOMITING THAT IS NOT CONTROLLED WITH YOUR NAUSEA MEDICATION  *UNUSUAL SHORTNESS OF BREATH  *UNUSUAL BRUISING OR BLEEDING  TENDERNESS IN MOUTH AND THROAT WITH OR WITHOUT PRESENCE OF ULCERS  *URINARY PROBLEMS  *BOWEL PROBLEMS  UNUSUAL RASH Items with * indicate a potential emergency and should be followed up as soon as possible.  Feel free to call the clinic should you have any questions or concerns. The clinic phone number is [phone].  Please show the CHEMO ALERT CARD at check-in to the Emergency Department and triage nurse.   

## 2020-06-29 ENCOUNTER — Other Ambulatory Visit: Payer: Self-pay | Admitting: Oncology

## 2020-07-04 DIAGNOSIS — Z7189 Other specified counseling: Secondary | ICD-10-CM | POA: Diagnosis not present

## 2020-07-04 DIAGNOSIS — D46Z Other myelodysplastic syndromes: Secondary | ICD-10-CM | POA: Diagnosis not present

## 2020-07-04 DIAGNOSIS — Z01818 Encounter for other preprocedural examination: Secondary | ICD-10-CM | POA: Diagnosis not present

## 2020-07-11 ENCOUNTER — Ambulatory Visit (INDEPENDENT_AMBULATORY_CARE_PROVIDER_SITE_OTHER): Payer: No Typology Code available for payment source

## 2020-07-11 DIAGNOSIS — I639 Cerebral infarction, unspecified: Secondary | ICD-10-CM | POA: Diagnosis not present

## 2020-07-11 DIAGNOSIS — D469 Myelodysplastic syndrome, unspecified: Secondary | ICD-10-CM | POA: Diagnosis not present

## 2020-07-11 DIAGNOSIS — D46Z Other myelodysplastic syndromes: Secondary | ICD-10-CM | POA: Diagnosis not present

## 2020-07-11 LAB — CUP PACEART REMOTE DEVICE CHECK
Date Time Interrogation Session: 20210929235123
Implantable Pulse Generator Implant Date: 20200114

## 2020-07-12 DIAGNOSIS — D46Z Other myelodysplastic syndromes: Secondary | ICD-10-CM | POA: Diagnosis not present

## 2020-07-13 DIAGNOSIS — D46Z Other myelodysplastic syndromes: Secondary | ICD-10-CM | POA: Diagnosis not present

## 2020-07-13 NOTE — Progress Notes (Signed)
[facility] Summary Report / Loop Recorder 

## 2020-07-14 ENCOUNTER — Other Ambulatory Visit: Payer: Self-pay | Admitting: Oncology

## 2020-07-16 ENCOUNTER — Other Ambulatory Visit: Payer: Self-pay | Admitting: Oncology

## 2020-07-18 DIAGNOSIS — Z005 Encounter for examination of potential donor of organ and tissue: Secondary | ICD-10-CM | POA: Diagnosis not present

## 2020-07-19 DIAGNOSIS — Z005 Encounter for examination of potential donor of organ and tissue: Secondary | ICD-10-CM | POA: Diagnosis not present

## 2020-07-20 DIAGNOSIS — D469 Myelodysplastic syndrome, unspecified: Secondary | ICD-10-CM | POA: Diagnosis not present

## 2020-07-20 DIAGNOSIS — F330 Major depressive disorder, recurrent, mild: Secondary | ICD-10-CM | POA: Diagnosis not present

## 2020-07-25 ENCOUNTER — Inpatient Hospital Stay (HOSPITAL_BASED_OUTPATIENT_CLINIC_OR_DEPARTMENT_OTHER): Payer: No Typology Code available for payment source | Admitting: Oncology

## 2020-07-25 ENCOUNTER — Other Ambulatory Visit: Payer: Self-pay | Admitting: Oncology

## 2020-07-25 ENCOUNTER — Inpatient Hospital Stay: Payer: No Typology Code available for payment source

## 2020-07-25 ENCOUNTER — Inpatient Hospital Stay: Payer: No Typology Code available for payment source | Attending: Oncology

## 2020-07-25 ENCOUNTER — Other Ambulatory Visit: Payer: Self-pay

## 2020-07-25 VITALS — BP 125/82 | HR 67 | Temp 97.0°F | Resp 18 | Ht 63.0 in | Wt 167.9 lb

## 2020-07-25 DIAGNOSIS — Z7982 Long term (current) use of aspirin: Secondary | ICD-10-CM | POA: Diagnosis not present

## 2020-07-25 DIAGNOSIS — D649 Anemia, unspecified: Secondary | ICD-10-CM

## 2020-07-25 DIAGNOSIS — D469 Myelodysplastic syndrome, unspecified: Secondary | ICD-10-CM | POA: Insufficient documentation

## 2020-07-25 DIAGNOSIS — Z95828 Presence of other vascular implants and grafts: Secondary | ICD-10-CM

## 2020-07-25 DIAGNOSIS — Z79899 Other long term (current) drug therapy: Secondary | ICD-10-CM | POA: Insufficient documentation

## 2020-07-25 DIAGNOSIS — Z5111 Encounter for antineoplastic chemotherapy: Secondary | ICD-10-CM | POA: Diagnosis not present

## 2020-07-25 LAB — CBC WITH DIFFERENTIAL (CANCER CENTER ONLY)
Abs Immature Granulocytes: 0.02 10*3/uL (ref 0.00–0.07)
Basophils Absolute: 0 10*3/uL (ref 0.0–0.1)
Basophils Relative: 1 %
Eosinophils Absolute: 0.1 10*3/uL (ref 0.0–0.5)
Eosinophils Relative: 4 %
HCT: 29.3 % — ABNORMAL LOW (ref 36.0–46.0)
Hemoglobin: 9.7 g/dL — ABNORMAL LOW (ref 12.0–15.0)
Immature Granulocytes: 1 %
Lymphocytes Relative: 35 %
Lymphs Abs: 1.1 10*3/uL (ref 0.7–4.0)
MCH: 33.1 pg (ref 26.0–34.0)
MCHC: 33.1 g/dL (ref 30.0–36.0)
MCV: 100 fL (ref 80.0–100.0)
Monocytes Absolute: 0.7 10*3/uL (ref 0.1–1.0)
Monocytes Relative: 20 %
Neutro Abs: 1.2 10*3/uL — ABNORMAL LOW (ref 1.7–7.7)
Neutrophils Relative %: 39 %
Platelet Count: 208 10*3/uL (ref 150–400)
RBC: 2.93 MIL/uL — ABNORMAL LOW (ref 3.87–5.11)
RDW: 19.1 % — ABNORMAL HIGH (ref 11.5–15.5)
WBC Count: 3.2 10*3/uL — ABNORMAL LOW (ref 4.0–10.5)
nRBC: 0 % (ref 0.0–0.2)

## 2020-07-25 LAB — CMP (CANCER CENTER ONLY)
ALT: 7 U/L (ref 0–44)
AST: 10 U/L — ABNORMAL LOW (ref 15–41)
Albumin: 3.1 g/dL — ABNORMAL LOW (ref 3.5–5.0)
Alkaline Phosphatase: 66 U/L (ref 38–126)
Anion gap: 5 (ref 5–15)
BUN: 18 mg/dL (ref 8–23)
CO2: 25 mmol/L (ref 22–32)
Calcium: 9.9 mg/dL (ref 8.9–10.3)
Chloride: 112 mmol/L — ABNORMAL HIGH (ref 98–111)
Creatinine: 0.88 mg/dL (ref 0.44–1.00)
GFR, Estimated: >60 mL/min (ref >60)
Glucose, Bld: 85 mg/dL (ref 70–99)
Potassium: 4.2 mmol/L (ref 3.5–5.1)
Sodium: 142 mmol/L (ref 135–145)
Total Bilirubin: 0.5 mg/dL (ref 0.3–1.2)
Total Protein: 7.1 g/dL (ref 6.5–8.1)

## 2020-07-25 LAB — SAMPLE TO BLOOD BANK

## 2020-07-25 MED ORDER — HEPARIN SOD (PORK) LOCK FLUSH 100 UNIT/ML IV SOLN
500.0000 [IU] | Freq: Once | INTRAVENOUS | Status: AC | PRN
  Administered 2020-07-25: 500 [IU]
  Filled 2020-07-25: qty 5

## 2020-07-25 MED ORDER — PALONOSETRON HCL INJECTION 0.25 MG/5ML
0.2500 mg | Freq: Once | INTRAVENOUS | Status: AC
  Administered 2020-07-25: 0.25 mg via INTRAVENOUS

## 2020-07-25 MED ORDER — SODIUM CHLORIDE 0.9 % IV SOLN
75.0000 mg/m2 | Freq: Once | INTRAVENOUS | Status: AC
  Administered 2020-07-25: 135 mg via INTRAVENOUS
  Filled 2020-07-25: qty 13.5

## 2020-07-25 MED ORDER — SODIUM CHLORIDE 0.9 % IV SOLN
10.0000 mg | Freq: Once | INTRAVENOUS | Status: AC
  Administered 2020-07-25: 10 mg via INTRAVENOUS
  Filled 2020-07-25: qty 10

## 2020-07-25 MED ORDER — PALONOSETRON HCL INJECTION 0.25 MG/5ML
INTRAVENOUS | Status: AC
  Filled 2020-07-25: qty 5

## 2020-07-25 MED ORDER — SODIUM CHLORIDE 0.9% FLUSH
10.0000 mL | Freq: Once | INTRAVENOUS | Status: AC
  Administered 2020-07-25: 10 mL
  Filled 2020-07-25: qty 10

## 2020-07-25 MED ORDER — SODIUM CHLORIDE 0.9% FLUSH
10.0000 mL | INTRAVENOUS | Status: DC | PRN
  Administered 2020-07-25: 10 mL
  Filled 2020-07-25: qty 10

## 2020-07-25 MED ORDER — SODIUM CHLORIDE 0.9 % IV SOLN
Freq: Once | INTRAVENOUS | Status: AC
  Filled 2020-07-25: qty 250

## 2020-07-25 NOTE — Patient Instructions (Signed)
[facility] Discharge Instructions for Patients Receiving Chemotherapy  Today you received the following chemotherapy agents azacitidine (Vidaza)   To help prevent nausea and vomiting after your treatment, we encourage you to take your nausea medication as directed.    If you develop nausea and vomiting that is not controlled by your nausea medication, call the clinic.   BELOW ARE SYMPTOMS THAT SHOULD BE REPORTED IMMEDIATELY:  *FEVER GREATER THAN 100.5 F  *CHILLS WITH OR WITHOUT FEVER  NAUSEA AND VOMITING THAT IS NOT CONTROLLED WITH YOUR NAUSEA MEDICATION  *UNUSUAL SHORTNESS OF BREATH  *UNUSUAL BRUISING OR BLEEDING  TENDERNESS IN MOUTH AND THROAT WITH OR WITHOUT PRESENCE OF ULCERS  *URINARY PROBLEMS  *BOWEL PROBLEMS  UNUSUAL RASH Items with * indicate a potential emergency and should be followed up as soon as possible.  Feel free to call the clinic should you have any questions or concerns. The clinic phone number is [phone].  Please show the CHEMO ALERT CARD at check-in to the Emergency Department and triage nurse.   

## 2020-07-25 NOTE — Progress Notes (Signed)
Per Dr. Alen Blew, ok to proceed with ANC 1.2

## 2020-07-25 NOTE — Progress Notes (Signed)
Hematology and Oncology Follow Up Visit  Breanna Perry [id number] 15-Nov-1955 64 y.o. 07/25/2020 8:59 AM [name], MDSmith, [name], MD   Principle Diagnosis: 64 year old woman with MDS presented with anemia,  IPSS-R score of 6, 14% blasts, 5 q -in February 2021.  Current therapy:   Supportive packed red cell transfusion.    Vidaza 75 mg per metered square IV started on January 18, 2020.  She is receiving 7 consecutive days every 4 weeks.  She completed 4 cycles of therapy.  He is currently receiving Vidaza on 5 consecutive days starting with cycle 5.  She is here for cycle 7 of therapy.   Interim History: [name] is here for return evaluation.  Since her last visit, she reports no major changes in her health.  She continues to feel reasonably well without any recent complaints.  She denies any nausea, vomiting or abdominal pain.  She denies any recent hospitalization or illnesses.  She denies any constipation or diarrhea.  She denies any changes in her mood.     Medications: Reviewed without changes. Current Outpatient Medications  Medication Sig Dispense Refill  . allopurinol (ZYLOPRIM) 300 MG tablet TAKE 1 TABLET (300 MG TOTAL) BY MOUTH 2 (TWO) TIMES DAILY. 60 tablet 0  . ARIPiprazole (ABILIFY) 5 MG tablet 5 mg.    . aspirin EC 81 MG EC tablet Take 1 tablet (81 mg total) by mouth daily.    Marland Kitchen atorvastatin (LIPITOR) 40 MG tablet Take 1 tablet (40 mg total) by mouth daily at 6 PM. (Patient taking differently: Take 40 mg by mouth daily. ) 30 tablet 2  . betamethasone dipropionate 0.05 % cream Apply topically 2 (two) times daily. Recently started     . busPIRone (BUSPAR) 10 MG tablet Take 10 mg by mouth 2 (two) times daily.    . citalopram (CELEXA) 40 MG tablet Take 40 mg by mouth daily.   1  . colesevelam (WELCHOL) 625 MG tablet Take 1,875 mg by mouth 2 (two) times daily. (Patient not taking: Reported on 06/20/2020)    . EQ GENTLE LAXATIVE 5 MG EC tablet See admin  instructions.     . Eszopiclone 3 MG TABS Take 3 mg by mouth at bedtime as needed (sleep).     . hydrocortisone 2.5 % cream Apply 1 application topically 2 (two) times daily. resently started (Patient not taking: Reported on 04/18/2020)    . KLOR-CON M20 20 MEQ tablet TAKE 1 TABLET BY MOUTH TWICE A DAY 30 tablet 0  . lidocaine-prilocaine (EMLA) cream Apply 1 application topically as needed. 30 g 0  . metoprolol succinate (TOPROL-XL) 50 MG 24 hr tablet Take 25 mg by mouth.     . Olmesartan-amLODIPine-HCTZ 40-5-25 MG TABS Take 1 tablet by mouth daily.    . pantoprazole (PROTONIX) 40 MG tablet Take 1 tablet (40 mg total) by mouth daily. 60 tablet 1  . prochlorperazine (COMPAZINE) 10 MG tablet Take 1 tablet (10 mg total) by mouth every 6 (six) hours as needed for nausea or vomiting. 30 tablet 0   No current facility-administered medications for this visit.     Allergies: No Known Allergies    Physical Exam:       Blood pressure 125/82, pulse 67, temperature (!) 97 F (36.1 C), temperature source Tympanic, resp. rate 18, height '5\' 3"'  (1.6 m), weight 167 lb 14.4 oz (76.2 kg), SpO2 100 %.      ECOG: 1   General appearance: Alert, awake without any distress. Head: Atraumatic  without abnormalities Oropharynx: Without any thrush or ulcers. Eyes: No scleral icterus. Lymph nodes: No lymphadenopathy noted in the cervical, supraclavicular, or axillary nodes Heart:regular rate and rhythm, without any murmurs or gallops.   Lung: Clear to auscultation without any rhonchi, wheezes or dullness to percussion. Abdomin: Soft, nontender without any shifting dullness or ascites. Musculoskeletal: No clubbing or cyanosis. Neurological: No motor or sensory deficits. Skin: No rashes or lesions.            Lab Results: Lab Results  Component Value Date   WBC 3.2 (L) 07/25/2020   HGB 9.7 (L) 07/25/2020   HCT 29.3 (L) 07/25/2020   MCV 100.0 07/25/2020   PLT 208 07/25/2020      Chemistry      Component Value Date/Time   NA 142 06/20/2020 0838   NA 141 10/10/2018 1333   K 3.8 06/20/2020 0838   CL 110 06/20/2020 0838   CO2 23 06/20/2020 0838   BUN 8 06/20/2020 0838   BUN 12 10/10/2018 1333   CREATININE 0.82 06/20/2020 0838   CREATININE 1.01 03/29/2014 1428      Component Value Date/Time   CALCIUM 9.7 06/20/2020 0838   ALKPHOS 78 06/20/2020 0838   AST 11 (L) 06/20/2020 0838   ALT 8 06/20/2020 0838   BILITOT 0.5 06/20/2020 0838       Impression and Plan:   64 year old woman with:  1.  Myelodysplastic syndrome (MDS) presented with symptomatic anemia, IPSS-R score is 6 and 14% of blasts in February 2021.  She has completed this 6 cycles of by days without any major complications.  Repeat bone marrow biopsy showed no increased blasts.  She is currently under evaluation for stem cell transplant once the donor is available.  Risks and benefits of proceeding with cycle 7 of therapy were reviewed.  Potential complications including constipation, nausea, neutropenia and sepsis were reiterated.  Absolute neutrophil count today is adequate.  He is agreeable to proceed with the cycle of treatment and will schedule the next cycle in 4 weeks.  This will be canceled of stem cell transplant is Administrator, arts.  2.  Anemia: Her hemoglobin continues to be stable without any need for transfusion at this time.   3.  IV access: Port-A-Cath currently in use without any issues.  4.  Constipation: Her bowels are moving reasonably regular at this time.   5.  Prognosis: Therapy remains curative at this time especially with a stem cell transplant.  6.  Antiemetics: No nausea or vomiting reported at this time.  7.  Hypokalemia: Her potassium was corrected on 06/20/2020 we will continue to monitor.  8.  Hypotension: Blood pressure is within normal range at this time.  9.  Follow-up: We will be in 4 weeks for the next cycle of therapy.   30  minutes were spent on this visit.   The time was dedicated to reviewing her disease status, discussing treatment options and future plan of care review.    Zola Button, MD 10/18/20218:59 AM

## 2020-07-26 ENCOUNTER — Encounter: Payer: Self-pay | Admitting: Oncology

## 2020-07-26 ENCOUNTER — Other Ambulatory Visit: Payer: Self-pay

## 2020-07-26 ENCOUNTER — Inpatient Hospital Stay: Payer: No Typology Code available for payment source

## 2020-07-26 VITALS — BP 153/89 | HR 66 | Temp 98.3°F | Resp 18

## 2020-07-26 DIAGNOSIS — Z5111 Encounter for antineoplastic chemotherapy: Secondary | ICD-10-CM | POA: Diagnosis not present

## 2020-07-26 DIAGNOSIS — D469 Myelodysplastic syndrome, unspecified: Secondary | ICD-10-CM

## 2020-07-26 MED ORDER — HEPARIN SOD (PORK) LOCK FLUSH 100 UNIT/ML IV SOLN
500.0000 [IU] | Freq: Once | INTRAVENOUS | Status: AC | PRN
  Administered 2020-07-26: 500 [IU]
  Filled 2020-07-26: qty 5

## 2020-07-26 MED ORDER — SODIUM CHLORIDE 0.9 % IV SOLN
10.0000 mg | Freq: Once | INTRAVENOUS | Status: AC
  Administered 2020-07-26: 10 mg via INTRAVENOUS
  Filled 2020-07-26: qty 10

## 2020-07-26 MED ORDER — SODIUM CHLORIDE 0.9% FLUSH
10.0000 mL | INTRAVENOUS | Status: DC | PRN
  Administered 2020-07-26: 10 mL
  Filled 2020-07-26: qty 10

## 2020-07-26 MED ORDER — SODIUM CHLORIDE 0.9 % IV SOLN
Freq: Once | INTRAVENOUS | Status: AC
  Filled 2020-07-26: qty 250

## 2020-07-26 MED ORDER — SODIUM CHLORIDE 0.9 % IV SOLN
75.0000 mg/m2 | Freq: Once | INTRAVENOUS | Status: AC
  Administered 2020-07-26: 135 mg via INTRAVENOUS
  Filled 2020-07-26: qty 13.5

## 2020-07-26 NOTE — Patient Instructions (Signed)
[facility] Discharge Instructions for Patients Receiving Chemotherapy  Today you received the following chemotherapy agents azacitidine (Vidaza)   To help prevent nausea and vomiting after your treatment, we encourage you to take your nausea medication as directed.    If you develop nausea and vomiting that is not controlled by your nausea medication, call the clinic.   BELOW ARE SYMPTOMS THAT SHOULD BE REPORTED IMMEDIATELY:  *FEVER GREATER THAN 100.5 F  *CHILLS WITH OR WITHOUT FEVER  NAUSEA AND VOMITING THAT IS NOT CONTROLLED WITH YOUR NAUSEA MEDICATION  *UNUSUAL SHORTNESS OF BREATH  *UNUSUAL BRUISING OR BLEEDING  TENDERNESS IN MOUTH AND THROAT WITH OR WITHOUT PRESENCE OF ULCERS  *URINARY PROBLEMS  *BOWEL PROBLEMS  UNUSUAL RASH Items with * indicate a potential emergency and should be followed up as soon as possible.  Feel free to call the clinic should you have any questions or concerns. The clinic phone number is [phone].  Please show the CHEMO ALERT CARD at check-in to the Emergency Department and triage nurse.   

## 2020-07-27 ENCOUNTER — Inpatient Hospital Stay: Payer: No Typology Code available for payment source

## 2020-07-27 ENCOUNTER — Other Ambulatory Visit: Payer: Self-pay

## 2020-07-27 VITALS — BP 149/81 | HR 55 | Temp 98.2°F | Resp 18

## 2020-07-27 DIAGNOSIS — Z5111 Encounter for antineoplastic chemotherapy: Secondary | ICD-10-CM | POA: Diagnosis not present

## 2020-07-27 DIAGNOSIS — D469 Myelodysplastic syndrome, unspecified: Secondary | ICD-10-CM

## 2020-07-27 MED ORDER — PALONOSETRON HCL INJECTION 0.25 MG/5ML
INTRAVENOUS | Status: AC
  Filled 2020-07-27: qty 5

## 2020-07-27 MED ORDER — SODIUM CHLORIDE 0.9 % IV SOLN
75.0000 mg/m2 | Freq: Once | INTRAVENOUS | Status: AC
  Administered 2020-07-27: 135 mg via INTRAVENOUS
  Filled 2020-07-27: qty 13.5

## 2020-07-27 MED ORDER — HEPARIN SOD (PORK) LOCK FLUSH 100 UNIT/ML IV SOLN
500.0000 [IU] | Freq: Once | INTRAVENOUS | Status: DC | PRN
  Filled 2020-07-27: qty 5

## 2020-07-27 MED ORDER — PALONOSETRON HCL INJECTION 0.25 MG/5ML
0.2500 mg | Freq: Once | INTRAVENOUS | Status: AC
  Administered 2020-07-27: 0.25 mg via INTRAVENOUS

## 2020-07-27 MED ORDER — SODIUM CHLORIDE 0.9% FLUSH
10.0000 mL | INTRAVENOUS | Status: DC | PRN
  Filled 2020-07-27: qty 10

## 2020-07-27 MED ORDER — SODIUM CHLORIDE 0.9 % IV SOLN
10.0000 mg | Freq: Once | INTRAVENOUS | Status: AC
  Administered 2020-07-27: 10 mg via INTRAVENOUS
  Filled 2020-07-27: qty 10

## 2020-07-27 MED ORDER — SODIUM CHLORIDE 0.9 % IV SOLN
Freq: Once | INTRAVENOUS | Status: AC
  Filled 2020-07-27: qty 250

## 2020-07-27 NOTE — Patient Instructions (Signed)
[facility] Discharge Instructions for Patients Receiving Chemotherapy  Today you received the following chemotherapy agents azacitidine (Vidaza)   To help prevent nausea and vomiting after your treatment, we encourage you to take your nausea medication as directed.    If you develop nausea and vomiting that is not controlled by your nausea medication, call the clinic.   BELOW ARE SYMPTOMS THAT SHOULD BE REPORTED IMMEDIATELY:  *FEVER GREATER THAN 100.5 F  *CHILLS WITH OR WITHOUT FEVER  NAUSEA AND VOMITING THAT IS NOT CONTROLLED WITH YOUR NAUSEA MEDICATION  *UNUSUAL SHORTNESS OF BREATH  *UNUSUAL BRUISING OR BLEEDING  TENDERNESS IN MOUTH AND THROAT WITH OR WITHOUT PRESENCE OF ULCERS  *URINARY PROBLEMS  *BOWEL PROBLEMS  UNUSUAL RASH Items with * indicate a potential emergency and should be followed up as soon as possible.  Feel free to call the clinic should you have any questions or concerns. The clinic phone number is [phone].  Please show the CHEMO ALERT CARD at check-in to the Emergency Department and triage nurse.   

## 2020-07-28 ENCOUNTER — Other Ambulatory Visit: Payer: Self-pay

## 2020-07-28 ENCOUNTER — Inpatient Hospital Stay: Payer: No Typology Code available for payment source

## 2020-07-28 VITALS — BP 149/83 | HR 62 | Temp 97.8°F | Resp 17

## 2020-07-28 DIAGNOSIS — D469 Myelodysplastic syndrome, unspecified: Secondary | ICD-10-CM

## 2020-07-28 DIAGNOSIS — Z5111 Encounter for antineoplastic chemotherapy: Secondary | ICD-10-CM | POA: Diagnosis not present

## 2020-07-28 MED ORDER — SODIUM CHLORIDE 0.9 % IV SOLN
Freq: Once | INTRAVENOUS | Status: AC
  Filled 2020-07-28: qty 250

## 2020-07-28 MED ORDER — SODIUM CHLORIDE 0.9% FLUSH
10.0000 mL | INTRAVENOUS | Status: DC | PRN
  Administered 2020-07-28: 10 mL
  Filled 2020-07-28: qty 10

## 2020-07-28 MED ORDER — SODIUM CHLORIDE 0.9 % IV SOLN
10.0000 mg | Freq: Once | INTRAVENOUS | Status: AC
  Administered 2020-07-28: 10 mg via INTRAVENOUS
  Filled 2020-07-28: qty 10

## 2020-07-28 MED ORDER — SODIUM CHLORIDE 0.9 % IV SOLN
75.0000 mg/m2 | Freq: Once | INTRAVENOUS | Status: AC
  Administered 2020-07-28: 135 mg via INTRAVENOUS
  Filled 2020-07-28: qty 13.5

## 2020-07-28 MED ORDER — HEPARIN SOD (PORK) LOCK FLUSH 100 UNIT/ML IV SOLN
500.0000 [IU] | Freq: Once | INTRAVENOUS | Status: AC | PRN
  Administered 2020-07-28: 500 [IU]
  Filled 2020-07-28: qty 5

## 2020-07-28 NOTE — Patient Instructions (Signed)
[facility] Discharge Instructions for Patients Receiving Chemotherapy  Today you received the following chemotherapy agents vidaza  To help prevent nausea and vomiting after your treatment, we encourage you to take your nausea medication as directed If you develop nausea and vomiting that is not controlled by your nausea medication, call the clinic.   BELOW ARE SYMPTOMS THAT SHOULD BE REPORTED IMMEDIATELY:  *FEVER GREATER THAN 100.5 F  *CHILLS WITH OR WITHOUT FEVER  NAUSEA AND VOMITING THAT IS NOT CONTROLLED WITH YOUR NAUSEA MEDICATION  *UNUSUAL SHORTNESS OF BREATH  *UNUSUAL BRUISING OR BLEEDING  TENDERNESS IN MOUTH AND THROAT WITH OR WITHOUT PRESENCE OF ULCERS  *URINARY PROBLEMS  *BOWEL PROBLEMS  UNUSUAL RASH Items with * indicate a potential emergency and should be followed up as soon as possible.  Feel free to call the clinic should you have any questions or concerns. The clinic phone number is [phone].  Please show the CHEMO ALERT CARD at check-in to the Emergency Department and triage nurse.   

## 2020-07-29 ENCOUNTER — Other Ambulatory Visit: Payer: Self-pay

## 2020-07-29 ENCOUNTER — Inpatient Hospital Stay: Payer: No Typology Code available for payment source

## 2020-07-29 VITALS — BP 170/90 | HR 65 | Temp 98.0°F | Resp 16

## 2020-07-29 DIAGNOSIS — Z5111 Encounter for antineoplastic chemotherapy: Secondary | ICD-10-CM | POA: Diagnosis not present

## 2020-07-29 DIAGNOSIS — D469 Myelodysplastic syndrome, unspecified: Secondary | ICD-10-CM

## 2020-07-29 MED ORDER — HEPARIN SOD (PORK) LOCK FLUSH 100 UNIT/ML IV SOLN
500.0000 [IU] | Freq: Once | INTRAVENOUS | Status: AC | PRN
  Administered 2020-07-29: 500 [IU]
  Filled 2020-07-29: qty 5

## 2020-07-29 MED ORDER — SODIUM CHLORIDE 0.9% FLUSH
10.0000 mL | INTRAVENOUS | Status: DC | PRN
  Administered 2020-07-29: 10 mL
  Filled 2020-07-29: qty 10

## 2020-07-29 MED ORDER — PALONOSETRON HCL INJECTION 0.25 MG/5ML
INTRAVENOUS | Status: AC
  Filled 2020-07-29: qty 5

## 2020-07-29 MED ORDER — SODIUM CHLORIDE 0.9 % IV SOLN
75.0000 mg/m2 | Freq: Once | INTRAVENOUS | Status: AC
  Administered 2020-07-29: 135 mg via INTRAVENOUS
  Filled 2020-07-29: qty 13.5

## 2020-07-29 MED ORDER — SODIUM CHLORIDE 0.9 % IV SOLN
Freq: Once | INTRAVENOUS | Status: AC
  Filled 2020-07-29: qty 250

## 2020-07-29 MED ORDER — PALONOSETRON HCL INJECTION 0.25 MG/5ML
0.2500 mg | Freq: Once | INTRAVENOUS | Status: AC
  Administered 2020-07-29: 0.25 mg via INTRAVENOUS

## 2020-07-29 MED ORDER — SODIUM CHLORIDE 0.9 % IV SOLN
10.0000 mg | Freq: Once | INTRAVENOUS | Status: AC
  Administered 2020-07-29: 10 mg via INTRAVENOUS
  Filled 2020-07-29: qty 10

## 2020-07-29 NOTE — Patient Instructions (Signed)
[facility] Discharge Instructions for Patients Receiving Chemotherapy  Today you received the following chemotherapy agents: azacitidine.  To help prevent nausea and vomiting after your treatment, we encourage you to take your nausea medication as directed.   If you develop nausea and vomiting that is not controlled by your nausea medication, call the clinic.   BELOW ARE SYMPTOMS THAT SHOULD BE REPORTED IMMEDIATELY:  *FEVER GREATER THAN 100.5 F  *CHILLS WITH OR WITHOUT FEVER  NAUSEA AND VOMITING THAT IS NOT CONTROLLED WITH YOUR NAUSEA MEDICATION  *UNUSUAL SHORTNESS OF BREATH  *UNUSUAL BRUISING OR BLEEDING  TENDERNESS IN MOUTH AND THROAT WITH OR WITHOUT PRESENCE OF ULCERS  *URINARY PROBLEMS  *BOWEL PROBLEMS  UNUSUAL RASH Items with * indicate a potential emergency and should be followed up as soon as possible.  Feel free to call the clinic should you have any questions or concerns. The clinic phone number is [phone].  Please show the CHEMO ALERT CARD at check-in to the Emergency Department and triage nurse.   

## 2020-08-04 DIAGNOSIS — D46Z Other myelodysplastic syndromes: Secondary | ICD-10-CM | POA: Diagnosis not present

## 2020-08-05 DIAGNOSIS — I361 Nonrheumatic tricuspid (valve) insufficiency: Secondary | ICD-10-CM | POA: Diagnosis not present

## 2020-08-05 DIAGNOSIS — Z87891 Personal history of nicotine dependence: Secondary | ICD-10-CM | POA: Diagnosis not present

## 2020-08-05 DIAGNOSIS — E785 Hyperlipidemia, unspecified: Secondary | ICD-10-CM | POA: Diagnosis not present

## 2020-08-05 DIAGNOSIS — Z113 Encounter for screening for infections with a predominantly sexual mode of transmission: Secondary | ICD-10-CM | POA: Diagnosis not present

## 2020-08-05 DIAGNOSIS — I371 Nonrheumatic pulmonary valve insufficiency: Secondary | ICD-10-CM | POA: Diagnosis not present

## 2020-08-05 DIAGNOSIS — Z79899 Other long term (current) drug therapy: Secondary | ICD-10-CM | POA: Diagnosis not present

## 2020-08-05 DIAGNOSIS — D469 Myelodysplastic syndrome, unspecified: Secondary | ICD-10-CM | POA: Diagnosis not present

## 2020-08-05 DIAGNOSIS — I340 Nonrheumatic mitral (valve) insufficiency: Secondary | ICD-10-CM | POA: Diagnosis not present

## 2020-08-05 DIAGNOSIS — R059 Cough, unspecified: Secondary | ICD-10-CM | POA: Diagnosis not present

## 2020-08-05 DIAGNOSIS — R0600 Dyspnea, unspecified: Secondary | ICD-10-CM | POA: Diagnosis not present

## 2020-08-05 DIAGNOSIS — Z01818 Encounter for other preprocedural examination: Secondary | ICD-10-CM | POA: Diagnosis not present

## 2020-08-05 DIAGNOSIS — D46Z Other myelodysplastic syndromes: Secondary | ICD-10-CM | POA: Diagnosis not present

## 2020-08-05 DIAGNOSIS — R413 Other amnesia: Secondary | ICD-10-CM | POA: Diagnosis not present

## 2020-08-05 DIAGNOSIS — J9811 Atelectasis: Secondary | ICD-10-CM | POA: Diagnosis not present

## 2020-08-08 DIAGNOSIS — D46Z Other myelodysplastic syndromes: Secondary | ICD-10-CM | POA: Diagnosis not present

## 2020-08-08 DIAGNOSIS — Z87891 Personal history of nicotine dependence: Secondary | ICD-10-CM | POA: Diagnosis not present

## 2020-08-09 ENCOUNTER — Other Ambulatory Visit: Payer: Self-pay | Admitting: Oncology

## 2020-08-10 ENCOUNTER — Other Ambulatory Visit: Payer: Self-pay | Admitting: Oncology

## 2020-08-11 LAB — CUP PACEART REMOTE DEVICE CHECK
Date Time Interrogation Session: 20211101235501
Implantable Pulse Generator Implant Date: 20200114

## 2020-08-15 ENCOUNTER — Ambulatory Visit (INDEPENDENT_AMBULATORY_CARE_PROVIDER_SITE_OTHER): Payer: No Typology Code available for payment source

## 2020-08-15 DIAGNOSIS — I639 Cerebral infarction, unspecified: Secondary | ICD-10-CM | POA: Diagnosis not present

## 2020-08-16 NOTE — Progress Notes (Signed)
[facility] Summary Report / Loop Recorder 

## 2020-08-17 DIAGNOSIS — D46Z Other myelodysplastic syndromes: Secondary | ICD-10-CM | POA: Diagnosis not present

## 2020-08-19 ENCOUNTER — Telehealth: Payer: Self-pay | Admitting: Oncology

## 2020-08-19 NOTE — Telephone Encounter (Signed)
Scheduled per los, patient has been called and notified. 

## 2020-08-20 ENCOUNTER — Other Ambulatory Visit: Payer: Self-pay | Admitting: Oncology

## 2020-08-22 ENCOUNTER — Inpatient Hospital Stay: Payer: No Typology Code available for payment source | Attending: Oncology

## 2020-08-22 ENCOUNTER — Inpatient Hospital Stay (HOSPITAL_BASED_OUTPATIENT_CLINIC_OR_DEPARTMENT_OTHER): Payer: No Typology Code available for payment source | Admitting: Oncology

## 2020-08-22 ENCOUNTER — Inpatient Hospital Stay: Payer: No Typology Code available for payment source

## 2020-08-22 ENCOUNTER — Other Ambulatory Visit: Payer: Self-pay

## 2020-08-22 VITALS — BP 148/84 | HR 69 | Temp 97.7°F | Resp 18 | Ht 63.0 in | Wt 176.8 lb

## 2020-08-22 DIAGNOSIS — Z79899 Other long term (current) drug therapy: Secondary | ICD-10-CM | POA: Insufficient documentation

## 2020-08-22 DIAGNOSIS — D649 Anemia, unspecified: Secondary | ICD-10-CM

## 2020-08-22 DIAGNOSIS — D469 Myelodysplastic syndrome, unspecified: Secondary | ICD-10-CM

## 2020-08-22 DIAGNOSIS — D630 Anemia in neoplastic disease: Secondary | ICD-10-CM | POA: Insufficient documentation

## 2020-08-22 DIAGNOSIS — Z5111 Encounter for antineoplastic chemotherapy: Secondary | ICD-10-CM | POA: Insufficient documentation

## 2020-08-22 DIAGNOSIS — D46C Myelodysplastic syndrome with isolated del(5q) chromosomal abnormality: Secondary | ICD-10-CM | POA: Diagnosis present

## 2020-08-22 DIAGNOSIS — Z95828 Presence of other vascular implants and grafts: Secondary | ICD-10-CM

## 2020-08-22 LAB — CMP (CANCER CENTER ONLY)
ALT: 11 U/L (ref 0–44)
AST: 14 U/L — ABNORMAL LOW (ref 15–41)
Albumin: 3.3 g/dL — ABNORMAL LOW (ref 3.5–5.0)
Alkaline Phosphatase: 90 U/L (ref 38–126)
Anion gap: 7 (ref 5–15)
BUN: 19 mg/dL (ref 8–23)
CO2: 25 mmol/L (ref 22–32)
Calcium: 9.4 mg/dL (ref 8.9–10.3)
Chloride: 110 mmol/L (ref 98–111)
Creatinine: 0.98 mg/dL (ref 0.44–1.00)
GFR, Estimated: >60 mL/min (ref >60)
Glucose, Bld: 98 mg/dL (ref 70–99)
Potassium: 4.3 mmol/L (ref 3.5–5.1)
Sodium: 142 mmol/L (ref 135–145)
Total Bilirubin: 0.7 mg/dL (ref 0.3–1.2)
Total Protein: 6.7 g/dL (ref 6.5–8.1)

## 2020-08-22 LAB — CBC WITH DIFFERENTIAL (CANCER CENTER ONLY)
Abs Immature Granulocytes: 0.01 10*3/uL (ref 0.00–0.07)
Basophils Absolute: 0.1 10*3/uL (ref 0.0–0.1)
Basophils Relative: 2 %
Eosinophils Absolute: 0 10*3/uL (ref 0.0–0.5)
Eosinophils Relative: 1 %
HCT: 29.1 % — ABNORMAL LOW (ref 36.0–46.0)
Hemoglobin: 9.6 g/dL — ABNORMAL LOW (ref 12.0–15.0)
Immature Granulocytes: 0 %
Lymphocytes Relative: 41 %
Lymphs Abs: 1.3 10*3/uL (ref 0.7–4.0)
MCH: 33.3 pg (ref 26.0–34.0)
MCHC: 33 g/dL (ref 30.0–36.0)
MCV: 101 fL — ABNORMAL HIGH (ref 80.0–100.0)
Monocytes Absolute: 0.4 10*3/uL (ref 0.1–1.0)
Monocytes Relative: 12 %
Neutro Abs: 1.4 10*3/uL — ABNORMAL LOW (ref 1.7–7.7)
Neutrophils Relative %: 44 %
Platelet Count: 165 10*3/uL (ref 150–400)
RBC: 2.88 MIL/uL — ABNORMAL LOW (ref 3.87–5.11)
RDW: 18.9 % — ABNORMAL HIGH (ref 11.5–15.5)
WBC Count: 3.2 10*3/uL — ABNORMAL LOW (ref 4.0–10.5)
nRBC: 0 % (ref 0.0–0.2)

## 2020-08-22 LAB — SAMPLE TO BLOOD BANK

## 2020-08-22 MED ORDER — SODIUM CHLORIDE 0.9% FLUSH
10.0000 mL | INTRAVENOUS | Status: DC | PRN
  Administered 2020-08-22: 10 mL
  Filled 2020-08-22: qty 10

## 2020-08-22 MED ORDER — SODIUM CHLORIDE 0.9 % IV SOLN
Freq: Once | INTRAVENOUS | Status: AC
  Filled 2020-08-22: qty 250

## 2020-08-22 MED ORDER — HEPARIN SOD (PORK) LOCK FLUSH 100 UNIT/ML IV SOLN
500.0000 [IU] | Freq: Once | INTRAVENOUS | Status: AC | PRN
  Administered 2020-08-22: 500 [IU]
  Filled 2020-08-22: qty 5

## 2020-08-22 MED ORDER — SODIUM CHLORIDE 0.9% FLUSH
10.0000 mL | Freq: Once | INTRAVENOUS | Status: AC
  Administered 2020-08-22: 10 mL
  Filled 2020-08-22: qty 10

## 2020-08-22 MED ORDER — SODIUM CHLORIDE 0.9 % IV SOLN
75.0000 mg/m2 | Freq: Once | INTRAVENOUS | Status: AC
  Administered 2020-08-22: 135 mg via INTRAVENOUS
  Filled 2020-08-22: qty 13.5

## 2020-08-22 MED ORDER — PALONOSETRON HCL INJECTION 0.25 MG/5ML
0.2500 mg | Freq: Once | INTRAVENOUS | Status: AC
  Administered 2020-08-22: 0.25 mg via INTRAVENOUS

## 2020-08-22 MED ORDER — SODIUM CHLORIDE 0.9 % IV SOLN
10.0000 mg | Freq: Once | INTRAVENOUS | Status: AC
  Administered 2020-08-22: 10 mg via INTRAVENOUS
  Filled 2020-08-22: qty 10

## 2020-08-22 MED ORDER — PALONOSETRON HCL INJECTION 0.25 MG/5ML
INTRAVENOUS | Status: AC
  Filled 2020-08-22: qty 5

## 2020-08-22 NOTE — Patient Instructions (Signed)

## 2020-08-22 NOTE — Progress Notes (Signed)
Per Dr. Alen Blew, Ross to treat with Marine 1.4.

## 2020-08-22 NOTE — Patient Instructions (Signed)
[facility] Center Discharge Instructions for Patients Receiving Chemotherapy  Today you received the following chemotherapy agents Vidaza  To help prevent nausea and vomiting after your treatment, we encourage you to take your nausea medication as directed  If you develop nausea and vomiting that is not controlled by your nausea medication, call the clinic.   BELOW ARE SYMPTOMS THAT SHOULD BE REPORTED IMMEDIATELY:  *FEVER GREATER THAN 100.5 F  *CHILLS WITH OR WITHOUT FEVER  NAUSEA AND VOMITING THAT IS NOT CONTROLLED WITH YOUR NAUSEA MEDICATION  *UNUSUAL SHORTNESS OF BREATH  *UNUSUAL BRUISING OR BLEEDING  TENDERNESS IN MOUTH AND THROAT WITH OR WITHOUT PRESENCE OF ULCERS  *URINARY PROBLEMS  *BOWEL PROBLEMS  UNUSUAL RASH Items with * indicate a potential emergency and should be followed up as soon as possible.  Feel free to call the clinic should you have any questions or concerns. The clinic phone number is [phone].  Please show the CHEMO ALERT CARD at check-in to the Emergency Department and triage nurse.   

## 2020-08-22 NOTE — Progress Notes (Signed)
Hematology and Oncology Follow Up Visit  Breanna Perry [id number] 28-Dec-1955 64 y.o. 08/22/2020 9:05 AM Breanna Perry, MDSmith, Breanna Hope, MD   Principle Diagnosis: 65 year old woman with MDS diagnosed and February 2021.  She was found to have symptomatic anemia, IPSS-R score of 6, 14% blasts, 5 q -.  Current therapy:   Supportive packed red cell transfusion.    Vidaza 75 mg per metered square IV started on January 18, 2020.  She is receiving 7 consecutive days every 4 weeks.  She completed 4 cycles of therapy.  He is currently receiving Vidaza on 5 consecutive days starting with cycle 5.  She is here for cycle 8 of therapy.  She is currently under evaluation for stem cell transplant.   Interim History: Breanna Perry presents today for a follow-up visit.  Since the last visit, she reports no major changes in her health.  She continues to tolerate current therapy without any major complaints.  She denies any nausea, vomiting or abdominal pain.  She denies any constipation or diarrhea.  She continues to have excellent performance status and continues to work regularly.     Medications: Unchanged on review. Current Outpatient Medications  Medication Sig Dispense Refill  . allopurinol (ZYLOPRIM) 300 MG tablet TAKE 1 TABLET (300 MG TOTAL) BY MOUTH 2 (TWO) TIMES DAILY. 60 tablet 0  . ARIPiprazole (ABILIFY) 5 MG tablet 5 mg.    . aspirin EC 81 MG EC tablet Take 1 tablet (81 mg total) by mouth daily.    Marland Kitchen atorvastatin (LIPITOR) 40 MG tablet Take 1 tablet (40 mg total) by mouth daily at 6 PM. (Patient taking differently: Take 40 mg by mouth daily. ) 30 tablet 2  . betamethasone dipropionate 0.05 % cream Apply topically 2 (two) times daily. Recently started     . busPIRone (BUSPAR) 10 MG tablet Take 10 mg by mouth 2 (two) times daily.    . citalopram (CELEXA) 40 MG tablet Take 40 mg by mouth daily.   1  . colesevelam (WELCHOL) 625 MG tablet Take 1,875 mg by mouth 2 (two) times daily. (Patient not  taking: Reported on 06/20/2020)    . EQ GENTLE LAXATIVE 5 MG EC tablet See admin instructions.     . Eszopiclone 3 MG TABS Take 3 mg by mouth at bedtime as needed (sleep).     . hydrocortisone 2.5 % cream Apply 1 application topically 2 (two) times daily. resently started (Patient not taking: Reported on 04/18/2020)    . KLOR-CON M20 20 MEQ tablet TAKE 1 TABLET BY MOUTH TWICE A DAY 30 tablet 0  . lidocaine-prilocaine (EMLA) cream Apply 1 application topically as needed. 30 g 0  . metoprolol succinate (TOPROL-XL) 50 MG 24 hr tablet Take 25 mg by mouth.     . Olmesartan-amLODIPine-HCTZ 40-5-25 MG TABS Take 1 tablet by mouth daily.    . pantoprazole (PROTONIX) 40 MG tablet Take 1 tablet (40 mg total) by mouth daily. 60 tablet 1  . prochlorperazine (COMPAZINE) 10 MG tablet Take 1 tablet (10 mg total) by mouth every 6 (six) hours as needed for nausea or vomiting. 30 tablet 0   No current facility-administered medications for this visit.     Allergies: No Known Allergies    Physical Exam:        Blood pressure (!) 148/84, pulse 69, temperature 97.7 F (36.5 C), temperature source Tympanic, resp. rate 18, height '5\' 3"'  (1.6 m), weight 176 lb 12.8 oz (80.2 kg).     ECOG: 1  General appearance: Comfortable appearing without any discomfort Head: Normocephalic without any trauma Oropharynx: Mucous membranes are moist and pink without any thrush or ulcers. Eyes: Pupils are equal and round reactive to light. Lymph nodes: No cervical, supraclavicular, inguinal or axillary lymphadenopathy.   Heart:regular rate and rhythm.  S1 and S2 without leg edema. Lung: Clear without any rhonchi or wheezes.  No dullness to percussion. Abdomin: Soft, nontender, nondistended with good bowel sounds.  No hepatosplenomegaly. Musculoskeletal: No joint deformity or effusion.  Full range of motion noted. Neurological: No deficits noted on motor, sensory and deep tendon reflex exam. Skin: No petechial rash  or dryness.  Appeared moist.              Lab Results: Lab Results  Component Value Date   WBC 3.2 (L) 07/25/2020   HGB 9.7 (L) 07/25/2020   HCT 29.3 (L) 07/25/2020   MCV 100.0 07/25/2020   PLT 208 07/25/2020     Chemistry      Component Value Date/Time   NA 142 07/25/2020 0826   NA 141 10/10/2018 1333   K 4.2 07/25/2020 0826   CL 112 (H) 07/25/2020 0826   CO2 25 07/25/2020 0826   BUN 18 07/25/2020 0826   BUN 12 10/10/2018 1333   CREATININE 0.88 07/25/2020 0826   CREATININE 1.01 03/29/2014 1428      Component Value Date/Time   CALCIUM 9.9 07/25/2020 0826   ALKPHOS 66 07/25/2020 0826   AST 10 (L) 07/25/2020 0826   ALT 7 07/25/2020 0826   BILITOT 0.5 07/25/2020 0826       Impression and Plan:   64 year old woman with:  1.  Myelodysplastic syndrome (MDS) diagnosed in February 2021.  She was found to have IPSS-R score is 6 and 14% of blasts in the bone marrow.  She continues to tolerate 5 days a with excellent response to therapy and currently under evaluation for high-dose chemotherapy and stem cell transplant.  She is currently under evaluation for allogeneic transplant with search for donor is ongoing.  She was recommended to proceed with the current cycle of treatment.  Risks and benefits of proceeding with Vidaza were discussed.  These complications include nausea, vomiting, constipation and myelosuppression.  She is agreeable to proceed at this time.  She did have a bone marrow biopsy completed on August 17, 2020 at [facility] which showed no increased blasts at this time by flow cytometry.  2.  Anemia: Her hemoglobin is 9.6 and does not require any transfusion at this time.   3.  IV access: Port-A-Cath remains in place without any issues.  4.  Constipation: Her bowels move regularly at this time with constipation less of an issue.   5.  Prognosis: Aggressive therapy is warranted given her excellent performance status with  possible curative intent therapy with stem cell transplant.  6.  Antiemetics: Compazine is available to her without any nausea or vomiting.   7.  Follow-up: She will return in 1 month for a repeat follow-up.   30  minutes were dedicated to this encounter.  Time was spent on reviewing her disease status, reviewing laboratory data and future plan of care discussion.    Zola Button, MD 11/15/20219:05 AM

## 2020-08-23 ENCOUNTER — Other Ambulatory Visit: Payer: Self-pay

## 2020-08-23 ENCOUNTER — Encounter: Payer: Self-pay | Admitting: Oncology

## 2020-08-23 ENCOUNTER — Inpatient Hospital Stay: Payer: No Typology Code available for payment source

## 2020-08-23 VITALS — BP 155/84 | HR 84 | Temp 98.2°F | Resp 16

## 2020-08-23 DIAGNOSIS — Z5111 Encounter for antineoplastic chemotherapy: Secondary | ICD-10-CM | POA: Diagnosis not present

## 2020-08-23 DIAGNOSIS — D469 Myelodysplastic syndrome, unspecified: Secondary | ICD-10-CM

## 2020-08-23 MED ORDER — SODIUM CHLORIDE 0.9 % IV SOLN
Freq: Once | INTRAVENOUS | Status: AC
  Filled 2020-08-23: qty 250

## 2020-08-23 MED ORDER — SODIUM CHLORIDE 0.9 % IV SOLN
10.0000 mg | Freq: Once | INTRAVENOUS | Status: AC
  Administered 2020-08-23: 10 mg via INTRAVENOUS
  Filled 2020-08-23: qty 10

## 2020-08-23 MED ORDER — SODIUM CHLORIDE 0.9 % IV SOLN
75.0000 mg/m2 | Freq: Once | INTRAVENOUS | Status: AC
  Administered 2020-08-23: 135 mg via INTRAVENOUS
  Filled 2020-08-23: qty 13.5

## 2020-08-23 MED ORDER — SODIUM CHLORIDE 0.9% FLUSH
10.0000 mL | INTRAVENOUS | Status: DC | PRN
  Administered 2020-08-23: 10 mL
  Filled 2020-08-23: qty 10

## 2020-08-23 MED ORDER — HEPARIN SOD (PORK) LOCK FLUSH 100 UNIT/ML IV SOLN
500.0000 [IU] | Freq: Once | INTRAVENOUS | Status: AC | PRN
  Administered 2020-08-23: 500 [IU]
  Filled 2020-08-23: qty 5

## 2020-08-23 NOTE — Patient Instructions (Signed)
[facility] Discharge Instructions for Patients Receiving Chemotherapy  Today you received the following chemotherapy agents Vidaza  To help prevent nausea and vomiting after your treatment, we encourage you to take your nausea medication as directed  If you develop nausea and vomiting that is not controlled by your nausea medication, call the clinic.   BELOW ARE SYMPTOMS THAT SHOULD BE REPORTED IMMEDIATELY:  *FEVER GREATER THAN 100.5 F  *CHILLS WITH OR WITHOUT FEVER  NAUSEA AND VOMITING THAT IS NOT CONTROLLED WITH YOUR NAUSEA MEDICATION  *UNUSUAL SHORTNESS OF BREATH  *UNUSUAL BRUISING OR BLEEDING  TENDERNESS IN MOUTH AND THROAT WITH OR WITHOUT PRESENCE OF ULCERS  *URINARY PROBLEMS  *BOWEL PROBLEMS  UNUSUAL RASH Items with * indicate a potential emergency and should be followed up as soon as possible.  Feel free to call the clinic should you have any questions or concerns. The clinic phone number is [phone].  Please show the CHEMO ALERT CARD at check-in to the Emergency Department and triage nurse.   

## 2020-08-24 ENCOUNTER — Inpatient Hospital Stay: Payer: No Typology Code available for payment source

## 2020-08-24 VITALS — BP 145/84 | HR 78 | Temp 98.2°F | Resp 18

## 2020-08-24 DIAGNOSIS — D469 Myelodysplastic syndrome, unspecified: Secondary | ICD-10-CM

## 2020-08-24 DIAGNOSIS — Z5111 Encounter for antineoplastic chemotherapy: Secondary | ICD-10-CM | POA: Diagnosis not present

## 2020-08-24 MED ORDER — SODIUM CHLORIDE 0.9 % IV SOLN
75.0000 mg/m2 | Freq: Once | INTRAVENOUS | Status: AC
  Administered 2020-08-24: 135 mg via INTRAVENOUS
  Filled 2020-08-24: qty 13.5

## 2020-08-24 MED ORDER — SODIUM CHLORIDE 0.9% FLUSH
10.0000 mL | INTRAVENOUS | Status: DC | PRN
  Filled 2020-08-24: qty 10

## 2020-08-24 MED ORDER — PALONOSETRON HCL INJECTION 0.25 MG/5ML
INTRAVENOUS | Status: AC
  Filled 2020-08-24: qty 5

## 2020-08-24 MED ORDER — PALONOSETRON HCL INJECTION 0.25 MG/5ML
0.2500 mg | Freq: Once | INTRAVENOUS | Status: AC
  Administered 2020-08-24: 0.25 mg via INTRAVENOUS

## 2020-08-24 MED ORDER — SODIUM CHLORIDE 0.9 % IV SOLN
10.0000 mg | Freq: Once | INTRAVENOUS | Status: AC
  Administered 2020-08-24: 10 mg via INTRAVENOUS
  Filled 2020-08-24: qty 10

## 2020-08-24 MED ORDER — SODIUM CHLORIDE 0.9 % IV SOLN
Freq: Once | INTRAVENOUS | Status: AC
  Filled 2020-08-24: qty 250

## 2020-08-24 MED ORDER — HEPARIN SOD (PORK) LOCK FLUSH 100 UNIT/ML IV SOLN
500.0000 [IU] | Freq: Once | INTRAVENOUS | Status: DC | PRN
  Filled 2020-08-24: qty 5

## 2020-08-24 NOTE — Patient Instructions (Signed)
[facility] Discharge Instructions for Patients Receiving Chemotherapy  Today you received the following chemotherapy agents Vidaza  To help prevent nausea and vomiting after your treatment, we encourage you to take your nausea medication as directed  If you develop nausea and vomiting that is not controlled by your nausea medication, call the clinic.   BELOW ARE SYMPTOMS THAT SHOULD BE REPORTED IMMEDIATELY:  *FEVER GREATER THAN 100.5 F  *CHILLS WITH OR WITHOUT FEVER  NAUSEA AND VOMITING THAT IS NOT CONTROLLED WITH YOUR NAUSEA MEDICATION  *UNUSUAL SHORTNESS OF BREATH  *UNUSUAL BRUISING OR BLEEDING  TENDERNESS IN MOUTH AND THROAT WITH OR WITHOUT PRESENCE OF ULCERS  *URINARY PROBLEMS  *BOWEL PROBLEMS  UNUSUAL RASH Items with * indicate a potential emergency and should be followed up as soon as possible.  Feel free to call the clinic should you have any questions or concerns. The clinic phone number is [phone].  Please show the CHEMO ALERT CARD at check-in to the Emergency Department and triage nurse.   

## 2020-08-25 ENCOUNTER — Inpatient Hospital Stay: Payer: No Typology Code available for payment source

## 2020-08-25 ENCOUNTER — Other Ambulatory Visit: Payer: Self-pay

## 2020-08-25 VITALS — BP 162/96 | HR 63 | Temp 98.2°F | Resp 18

## 2020-08-25 DIAGNOSIS — D469 Myelodysplastic syndrome, unspecified: Secondary | ICD-10-CM

## 2020-08-25 DIAGNOSIS — Z5111 Encounter for antineoplastic chemotherapy: Secondary | ICD-10-CM | POA: Diagnosis not present

## 2020-08-25 MED ORDER — HEPARIN SOD (PORK) LOCK FLUSH 100 UNIT/ML IV SOLN
500.0000 [IU] | Freq: Once | INTRAVENOUS | Status: AC | PRN
  Administered 2020-08-25: 500 [IU]
  Filled 2020-08-25: qty 5

## 2020-08-25 MED ORDER — SODIUM CHLORIDE 0.9% FLUSH
10.0000 mL | INTRAVENOUS | Status: DC | PRN
  Administered 2020-08-25: 10 mL
  Filled 2020-08-25: qty 10

## 2020-08-25 MED ORDER — SODIUM CHLORIDE 0.9 % IV SOLN
75.0000 mg/m2 | Freq: Once | INTRAVENOUS | Status: AC
  Administered 2020-08-25: 135 mg via INTRAVENOUS
  Filled 2020-08-25: qty 13.5

## 2020-08-25 MED ORDER — SODIUM CHLORIDE 0.9 % IV SOLN
10.0000 mg | Freq: Once | INTRAVENOUS | Status: AC
  Administered 2020-08-25: 10 mg via INTRAVENOUS
  Filled 2020-08-25: qty 10

## 2020-08-25 MED ORDER — SODIUM CHLORIDE 0.9 % IV SOLN
Freq: Once | INTRAVENOUS | Status: AC
  Filled 2020-08-25: qty 250

## 2020-08-25 NOTE — Patient Instructions (Signed)
[facility] Discharge Instructions for Patients Receiving Chemotherapy  Today you received the following chemotherapy agents Vidaza  To help prevent nausea and vomiting after your treatment, we encourage you to take your nausea medication as directed  If you develop nausea and vomiting that is not controlled by your nausea medication, call the clinic.   BELOW ARE SYMPTOMS THAT SHOULD BE REPORTED IMMEDIATELY:  *FEVER GREATER THAN 100.5 F  *CHILLS WITH OR WITHOUT FEVER  NAUSEA AND VOMITING THAT IS NOT CONTROLLED WITH YOUR NAUSEA MEDICATION  *UNUSUAL SHORTNESS OF BREATH  *UNUSUAL BRUISING OR BLEEDING  TENDERNESS IN MOUTH AND THROAT WITH OR WITHOUT PRESENCE OF ULCERS  *URINARY PROBLEMS  *BOWEL PROBLEMS  UNUSUAL RASH Items with * indicate a potential emergency and should be followed up as soon as possible.  Feel free to call the clinic should you have any questions or concerns. The clinic phone number is [phone].  Please show the CHEMO ALERT CARD at check-in to the Emergency Department and triage nurse.   

## 2020-08-26 ENCOUNTER — Inpatient Hospital Stay: Payer: No Typology Code available for payment source

## 2020-08-26 ENCOUNTER — Other Ambulatory Visit: Payer: Self-pay

## 2020-08-26 VITALS — BP 191/97 | HR 65 | Temp 98.0°F | Resp 17

## 2020-08-26 DIAGNOSIS — Z5111 Encounter for antineoplastic chemotherapy: Secondary | ICD-10-CM | POA: Diagnosis not present

## 2020-08-26 DIAGNOSIS — D469 Myelodysplastic syndrome, unspecified: Secondary | ICD-10-CM

## 2020-08-26 MED ORDER — SODIUM CHLORIDE 0.9% FLUSH
10.0000 mL | INTRAVENOUS | Status: DC | PRN
  Administered 2020-08-26: 10 mL
  Filled 2020-08-26: qty 10

## 2020-08-26 MED ORDER — SODIUM CHLORIDE 0.9 % IV SOLN
Freq: Once | INTRAVENOUS | Status: AC
  Filled 2020-08-26: qty 250

## 2020-08-26 MED ORDER — HEPARIN SOD (PORK) LOCK FLUSH 100 UNIT/ML IV SOLN
500.0000 [IU] | Freq: Once | INTRAVENOUS | Status: AC | PRN
  Administered 2020-08-26: 500 [IU]
  Filled 2020-08-26: qty 5

## 2020-08-26 MED ORDER — PALONOSETRON HCL INJECTION 0.25 MG/5ML
INTRAVENOUS | Status: AC
  Filled 2020-08-26: qty 5

## 2020-08-26 MED ORDER — SODIUM CHLORIDE 0.9 % IV SOLN
75.0000 mg/m2 | Freq: Once | INTRAVENOUS | Status: AC
  Administered 2020-08-26: 135 mg via INTRAVENOUS
  Filled 2020-08-26: qty 13.5

## 2020-08-26 MED ORDER — PALONOSETRON HCL INJECTION 0.25 MG/5ML
0.2500 mg | Freq: Once | INTRAVENOUS | Status: AC
  Administered 2020-08-26: 0.25 mg via INTRAVENOUS

## 2020-08-26 MED ORDER — SODIUM CHLORIDE 0.9 % IV SOLN
10.0000 mg | Freq: Once | INTRAVENOUS | Status: AC
  Administered 2020-08-26: 10 mg via INTRAVENOUS
  Filled 2020-08-26: qty 10

## 2020-08-26 NOTE — Patient Instructions (Signed)
[facility] Discharge Instructions for Patients Receiving Chemotherapy  Today you received the following chemotherapy agents Vidaza  To help prevent nausea and vomiting after your treatment, we encourage you to take your nausea medication as directed  If you develop nausea and vomiting that is not controlled by your nausea medication, call the clinic.   BELOW ARE SYMPTOMS THAT SHOULD BE REPORTED IMMEDIATELY:  *FEVER GREATER THAN 100.5 F  *CHILLS WITH OR WITHOUT FEVER  NAUSEA AND VOMITING THAT IS NOT CONTROLLED WITH YOUR NAUSEA MEDICATION  *UNUSUAL SHORTNESS OF BREATH  *UNUSUAL BRUISING OR BLEEDING  TENDERNESS IN MOUTH AND THROAT WITH OR WITHOUT PRESENCE OF ULCERS  *URINARY PROBLEMS  *BOWEL PROBLEMS  UNUSUAL RASH Items with * indicate a potential emergency and should be followed up as soon as possible.  Feel free to call the clinic should you have any questions or concerns. The clinic phone number is [phone].  Please show the CHEMO ALERT CARD at check-in to the Emergency Department and triage nurse.   

## 2020-08-28 DIAGNOSIS — R0902 Hypoxemia: Secondary | ICD-10-CM | POA: Diagnosis not present

## 2020-08-28 DIAGNOSIS — R55 Syncope and collapse: Secondary | ICD-10-CM | POA: Diagnosis not present

## 2020-08-29 ENCOUNTER — Other Ambulatory Visit: Payer: Self-pay | Admitting: Oncology

## 2020-08-30 DIAGNOSIS — D46Z Other myelodysplastic syndromes: Secondary | ICD-10-CM | POA: Diagnosis not present

## 2020-08-30 DIAGNOSIS — H6122 Impacted cerumen, left ear: Secondary | ICD-10-CM | POA: Diagnosis not present

## 2020-09-04 ENCOUNTER — Other Ambulatory Visit: Payer: Self-pay | Admitting: Oncology

## 2020-09-05 DIAGNOSIS — N6310 Unspecified lump in the right breast, unspecified quadrant: Secondary | ICD-10-CM | POA: Diagnosis not present

## 2020-09-06 ENCOUNTER — Other Ambulatory Visit: Payer: Self-pay | Admitting: Family Medicine

## 2020-09-06 DIAGNOSIS — N6310 Unspecified lump in the right breast, unspecified quadrant: Secondary | ICD-10-CM

## 2020-09-07 ENCOUNTER — Ambulatory Visit: Payer: BC Managed Care – PPO | Admitting: Psychology

## 2020-09-08 ENCOUNTER — Telehealth: Payer: Self-pay

## 2020-09-08 NOTE — Telephone Encounter (Signed)
The transplant coordinator called with lots of questions. I let her speak with Leigh, rn.

## 2020-09-08 NOTE — Telephone Encounter (Signed)
Packet info received from [facility]. Given to [place], RN to pass along to Dr. Rayann Heman. Packet is regarding patient is in consult for bone marrow transplant and the provider has questions regarding foreign device while immunocompromised. Discussed about this procedure in mid January. Advised I will pass along to Dr. Rayann Heman to advise.

## 2020-09-12 ENCOUNTER — Telehealth: Payer: Self-pay

## 2020-09-12 NOTE — Telephone Encounter (Signed)
Per Otila Kluver, RN.   Dr. Rayann Heman feels fine to leave her device in place but is also willing to explant if Dr. Nadara Mustard deems necessary. Called Donzetta Sprung, RN [phone], advised. States she will contact Dr. Nadara Mustard this week and get back in touch if the device needs to be explanted. If we do not hear back Dr. Nadara Mustard is fine leaving device in.   Direct phone number provided for questions or concerns.

## 2020-09-12 NOTE — Telephone Encounter (Signed)
Vickie from [facility] called and states that they do want to go ahead and get the loop removed. Per her Dr. Quintella Baton its best if they go ahead and get it removed, can you call patient to get this scheduled?

## 2020-09-13 NOTE — Telephone Encounter (Signed)
[facility] would like for the pt to get an earlier appointment for her explant. I told her I will get with the scheduler and try to get an earlier appointment. She wants to make sure the pt is healed before the transplant on 10-21-2020.

## 2020-09-13 NOTE — Telephone Encounter (Signed)
Pt is scheduled for an appt with Dr. Rayann Heman on 10/12/20 for ILR explant

## 2020-09-15 ENCOUNTER — Other Ambulatory Visit: Payer: Self-pay | Admitting: Oncology

## 2020-09-15 DIAGNOSIS — N6313 Unspecified lump in the right breast, lower outer quadrant: Secondary | ICD-10-CM | POA: Diagnosis not present

## 2020-09-15 DIAGNOSIS — R928 Other abnormal and inconclusive findings on diagnostic imaging of breast: Secondary | ICD-10-CM | POA: Diagnosis not present

## 2020-09-15 DIAGNOSIS — N6314 Unspecified lump in the right breast, lower inner quadrant: Secondary | ICD-10-CM | POA: Diagnosis not present

## 2020-09-18 LAB — CUP PACEART REMOTE DEVICE CHECK
Date Time Interrogation Session: 20211204225854
Implantable Pulse Generator Implant Date: 20200114

## 2020-09-19 ENCOUNTER — Inpatient Hospital Stay: Payer: BC Managed Care – PPO

## 2020-09-19 ENCOUNTER — Inpatient Hospital Stay (HOSPITAL_BASED_OUTPATIENT_CLINIC_OR_DEPARTMENT_OTHER): Payer: BC Managed Care – PPO | Admitting: Oncology

## 2020-09-19 ENCOUNTER — Other Ambulatory Visit: Payer: Self-pay

## 2020-09-19 ENCOUNTER — Ambulatory Visit (INDEPENDENT_AMBULATORY_CARE_PROVIDER_SITE_OTHER): Payer: BC Managed Care – PPO

## 2020-09-19 ENCOUNTER — Inpatient Hospital Stay: Payer: BC Managed Care – PPO | Attending: Oncology

## 2020-09-19 VITALS — BP 148/90 | HR 78 | Temp 97.4°F | Resp 18 | Ht 63.0 in | Wt 172.1 lb

## 2020-09-19 DIAGNOSIS — Z5111 Encounter for antineoplastic chemotherapy: Secondary | ICD-10-CM | POA: Insufficient documentation

## 2020-09-19 DIAGNOSIS — D46C Myelodysplastic syndrome with isolated del(5q) chromosomal abnormality: Secondary | ICD-10-CM | POA: Diagnosis not present

## 2020-09-19 DIAGNOSIS — Z7982 Long term (current) use of aspirin: Secondary | ICD-10-CM | POA: Insufficient documentation

## 2020-09-19 DIAGNOSIS — Z79899 Other long term (current) drug therapy: Secondary | ICD-10-CM | POA: Diagnosis not present

## 2020-09-19 DIAGNOSIS — D469 Myelodysplastic syndrome, unspecified: Secondary | ICD-10-CM

## 2020-09-19 DIAGNOSIS — I639 Cerebral infarction, unspecified: Secondary | ICD-10-CM

## 2020-09-19 DIAGNOSIS — D649 Anemia, unspecified: Secondary | ICD-10-CM

## 2020-09-19 LAB — CBC WITH DIFFERENTIAL (CANCER CENTER ONLY)
Abs Immature Granulocytes: 0 10*3/uL (ref 0.00–0.07)
Basophils Absolute: 0 10*3/uL (ref 0.0–0.1)
Basophils Relative: 2 %
Eosinophils Absolute: 0 10*3/uL (ref 0.0–0.5)
Eosinophils Relative: 2 %
HCT: 33 % — ABNORMAL LOW (ref 36.0–46.0)
Hemoglobin: 10.9 g/dL — ABNORMAL LOW (ref 12.0–15.0)
Immature Granulocytes: 0 %
Lymphocytes Relative: 52 %
Lymphs Abs: 1.2 10*3/uL (ref 0.7–4.0)
MCH: 32.7 pg (ref 26.0–34.0)
MCHC: 33 g/dL (ref 30.0–36.0)
MCV: 99.1 fL (ref 80.0–100.0)
Monocytes Absolute: 0.1 10*3/uL (ref 0.1–1.0)
Monocytes Relative: 5 %
Neutro Abs: 0.9 10*3/uL — ABNORMAL LOW (ref 1.7–7.7)
Neutrophils Relative %: 39 %
Platelet Count: 185 10*3/uL (ref 150–400)
RBC: 3.33 MIL/uL — ABNORMAL LOW (ref 3.87–5.11)
RDW: 17.2 % — ABNORMAL HIGH (ref 11.5–15.5)
WBC Count: 2.3 10*3/uL — ABNORMAL LOW (ref 4.0–10.5)
nRBC: 0 % (ref 0.0–0.2)

## 2020-09-19 LAB — CMP (CANCER CENTER ONLY)
ALT: 11 U/L (ref 0–44)
AST: 13 U/L — ABNORMAL LOW (ref 15–41)
Albumin: 3.5 g/dL (ref 3.5–5.0)
Alkaline Phosphatase: 74 U/L (ref 38–126)
Anion gap: 7 (ref 5–15)
BUN: 6 mg/dL — ABNORMAL LOW (ref 8–23)
CO2: 26 mmol/L (ref 22–32)
Calcium: 10 mg/dL (ref 8.9–10.3)
Chloride: 109 mmol/L (ref 98–111)
Creatinine: 0.92 mg/dL (ref 0.44–1.00)
GFR, Estimated: >60 mL/min (ref >60)
Glucose, Bld: 85 mg/dL (ref 70–99)
Potassium: 4.3 mmol/L (ref 3.5–5.1)
Sodium: 142 mmol/L (ref 135–145)
Total Bilirubin: 0.6 mg/dL (ref 0.3–1.2)
Total Protein: 7 g/dL (ref 6.5–8.1)

## 2020-09-19 LAB — SAMPLE TO BLOOD BANK

## 2020-09-19 MED ORDER — PALONOSETRON HCL INJECTION 0.25 MG/5ML
0.2500 mg | Freq: Once | INTRAVENOUS | Status: AC
  Administered 2020-09-19: 11:00:00 0.25 mg via INTRAVENOUS

## 2020-09-19 MED ORDER — HEPARIN SOD (PORK) LOCK FLUSH 100 UNIT/ML IV SOLN
500.0000 [IU] | Freq: Once | INTRAVENOUS | Status: AC | PRN
  Administered 2020-09-19: 13:00:00 500 [IU]
  Filled 2020-09-19: qty 5

## 2020-09-19 MED ORDER — DEXAMETHASONE SODIUM PHOSPHATE 100 MG/10ML IJ SOLN
10.0000 mg | Freq: Once | INTRAMUSCULAR | Status: AC
  Administered 2020-09-19: 11:00:00 10 mg via INTRAVENOUS
  Filled 2020-09-19: qty 10

## 2020-09-19 MED ORDER — SODIUM CHLORIDE 0.9 % IV SOLN
Freq: Once | INTRAVENOUS | Status: AC
  Filled 2020-09-19: qty 250

## 2020-09-19 MED ORDER — AZACITIDINE CHEMO INJECTION 100 MG
75.0000 mg/m2 | Freq: Once | INTRAMUSCULAR | Status: AC
  Administered 2020-09-19: 13:00:00 135 mg via INTRAVENOUS
  Filled 2020-09-19: qty 13.5

## 2020-09-19 MED ORDER — SODIUM CHLORIDE 0.9% FLUSH
10.0000 mL | INTRAVENOUS | Status: DC | PRN
  Administered 2020-09-19: 13:00:00 10 mL
  Filled 2020-09-19: qty 10

## 2020-09-19 NOTE — Patient Instructions (Signed)
[facility] Discharge Instructions for Patients Receiving Chemotherapy  Today you received the following chemotherapy agents: Azacitidine (VIDAZA).  To help prevent nausea and vomiting after your treatment, we encourage you to take your nausea medication as directed.   If you develop nausea and vomiting that is not controlled by your nausea medication, call the clinic.   BELOW ARE SYMPTOMS THAT SHOULD BE REPORTED IMMEDIATELY:  *FEVER GREATER THAN 100.5 F  *CHILLS WITH OR WITHOUT FEVER  NAUSEA AND VOMITING THAT IS NOT CONTROLLED WITH YOUR NAUSEA MEDICATION  *UNUSUAL SHORTNESS OF BREATH  *UNUSUAL BRUISING OR BLEEDING  TENDERNESS IN MOUTH AND THROAT WITH OR WITHOUT PRESENCE OF ULCERS  *URINARY PROBLEMS  *BOWEL PROBLEMS  UNUSUAL RASH Items with * indicate a potential emergency and should be followed up as soon as possible.  Feel free to call the clinic should you have any questions or concerns. The clinic phone number is [phone].  Please show the CHEMO ALERT CARD at check-in to the Emergency Department and triage nurse.   

## 2020-09-19 NOTE — Progress Notes (Signed)
Hematology and Oncology Follow Up Visit  Breanna Perry [id number] 1956/07/18 64 y.o. 09/19/2020 9:21 AM [name], MDSmith, [name], MD   Principle Diagnosis: 64 year old woman with MDS anemia, IPSS-R score of 6, 14% blasts, and 5 q -diagnosed in February 2021.  Current therapy:   Supportive packed red cell transfusion.    Vidaza 75 mg per metered square IV started on January 18, 2020.  Breanna Perry is receiving 7 consecutive days every 4 weeks.  Breanna Perry completed 4 cycles of therapy.  Breanna Perry is currently receiving Vidaza on 5 consecutive days starting with cycle 5.  Breanna Perry is here for cycle 9 of therapy.    Interim History: Breanna Perry returns today for repeat evaluation.  Since the last visit, Breanna Perry reports no major changes in Breanna Perry health.  Breanna Perry denies any nausea, vomiting or abdominal pain.  Breanna Perry denies any recent hospitalization or illnesses.  Breanna Perry denies any constipation or weight loss.  Breanna Perry remains active and continues to be excellent.     Medications: Reviewed today without changes. Current Outpatient Medications  Medication Sig Dispense Refill  . allopurinol (ZYLOPRIM) 300 MG tablet TAKE 1 TABLET (300 MG TOTAL) BY MOUTH 2 (TWO) TIMES DAILY. 60 tablet 0  . ARIPiprazole (ABILIFY) 5 MG tablet 5 mg.    . aspirin EC 81 MG EC tablet Take 1 tablet (81 mg total) by mouth daily.    Marland Kitchen atorvastatin (LIPITOR) 40 MG tablet Take 1 tablet (40 mg total) by mouth daily at 6 PM. (Patient taking differently: Take 40 mg by mouth daily. ) 30 tablet 2  . betamethasone dipropionate 0.05 % cream Apply topically 2 (two) times daily. Recently started     . busPIRone (BUSPAR) 10 MG tablet Take 10 mg by mouth 2 (two) times daily.    . citalopram (CELEXA) 40 MG tablet Take 40 mg by mouth daily.   1  . colesevelam (WELCHOL) 625 MG tablet Take 1,875 mg by mouth 2 (two) times daily. (Patient not taking: Reported on 06/20/2020)    . EQ GENTLE LAXATIVE 5 MG EC tablet See admin instructions.     . Eszopiclone 3 MG TABS Take 3 mg  by mouth at bedtime as needed (sleep).     . hydrocortisone 2.5 % cream Apply 1 application topically 2 (two) times daily. resently started (Patient not taking: Reported on 04/18/2020)    . KLOR-CON M20 20 MEQ tablet TAKE 1 TABLET BY MOUTH TWICE A DAY 30 tablet 0  . lidocaine-prilocaine (EMLA) cream Apply 1 application topically as needed. 30 g 0  . metoprolol succinate (TOPROL-XL) 50 MG 24 hr tablet Take 25 mg by mouth.     . Olmesartan-amLODIPine-HCTZ 40-5-25 MG TABS Take 1 tablet by mouth daily.    . pantoprazole (PROTONIX) 40 MG tablet Take 1 tablet (40 mg total) by mouth daily. 60 tablet 1  . prochlorperazine (COMPAZINE) 10 MG tablet Take 1 tablet (10 mg total) by mouth every 6 (six) hours as needed for nausea or vomiting. 30 tablet 0   No current facility-administered medications for this visit.     Allergies: No Known Allergies    Physical Exam:       Blood pressure (!) 148/90, pulse 78, temperature (!) 97.4 F (36.3 C), temperature source Tympanic, resp. rate 18, height _0  (1.6 m), weight 172 lb 1.6 oz (78.1 kg), SpO2 100 %.      ECOG: 1   General appearance: Alert, awake without any distress. Head: Atraumatic without abnormalities Oropharynx: Without any thrush or  ulcers. Eyes: No scleral icterus. Lymph nodes: No lymphadenopathy noted in the cervical, supraclavicular, or axillary nodes Heart:regular rate and rhythm, without any murmurs or gallops.   Lung: Clear to auscultation without any rhonchi, wheezes or dullness to percussion. Abdomin: Soft, nontender without any shifting dullness or ascites. Musculoskeletal: No clubbing or cyanosis. Neurological: No motor or sensory deficits. Skin: No rashes or lesions. t.              Lab Results: Lab Results  Component Value Date   WBC 3.2 (L) 08/22/2020   HGB 9.6 (L) 08/22/2020   HCT 29.1 (L) 08/22/2020   MCV 101.0 (H) 08/22/2020   PLT 165 08/22/2020     Chemistry      Component Value Date/Time    NA 142 08/22/2020 0850   NA 141 10/10/2018 1333   K 4.3 08/22/2020 0850   CL 110 08/22/2020 0850   CO2 25 08/22/2020 0850   BUN 19 08/22/2020 0850   BUN 12 10/10/2018 1333   CREATININE 0.98 08/22/2020 0850   CREATININE 1.01 03/29/2014 1428      Component Value Date/Time   CALCIUM 9.4 08/22/2020 0850   ALKPHOS 90 08/22/2020 0850   AST 14 (L) 08/22/2020 0850   ALT 11 08/22/2020 0850   BILITOT 0.7 08/22/2020 0850       Impression and Plan:   64 year old woman with:  1.  High risk myelodysplastic syndrome presented with IPSS-R score is 6 and 14% of blasts in the bone marrow in February 2021.  Breanna Perry continues to tolerate 5-azacytidine without any major complaints and excellent response based on bone marrow biopsy obtained in November 2021.  The natural course of Breanna Perry disease was reviewed and treatment options were discussed.  Breanna Perry is currently under evaluation for stem cell transplant which anticipated to occur in mid January.  Breanna Perry will receive haploidentical transplant from Breanna Perry brother.  Breanna Perry will receive treatment this cycle and no further treatment for the time being.   2.  Anemia: He has not received any packed red cell transfusion since Breanna Perry diagnosis.   3.  IV access: Port-A-Cath remains without any issues in use currently.  4.  Constipation: Resolved without any issues.   5.  Prognosis: Aggressive measures continues to be warranted at this time.  Breanna Perry is under evaluation for stem cell transplant.   6.  Antiemetics: No nausea or vomiting reported at this time.  Compazine is available to Breanna Perry.   7.  Follow-up: Will be determined in the future after Breanna Perry stem cell transplant.   30  minutes were spent on this visit.  The time was dedicated to reviewing Breanna Perry disease status, discussing treatment options and addressing complications related to Breanna Perry cancer and cancer therapy.     Breanna Button, MD 12/13/20219:21 AM

## 2020-09-19 NOTE — Progress Notes (Signed)
Per Dr. Alen Blew, okay to treat with ANC 0.9.

## 2020-09-20 ENCOUNTER — Other Ambulatory Visit: Payer: Self-pay

## 2020-09-20 ENCOUNTER — Inpatient Hospital Stay: Payer: BC Managed Care – PPO

## 2020-09-20 VITALS — BP 147/92 | HR 69 | Temp 98.3°F | Resp 18

## 2020-09-20 DIAGNOSIS — D469 Myelodysplastic syndrome, unspecified: Secondary | ICD-10-CM

## 2020-09-20 DIAGNOSIS — D46C Myelodysplastic syndrome with isolated del(5q) chromosomal abnormality: Secondary | ICD-10-CM | POA: Diagnosis not present

## 2020-09-20 DIAGNOSIS — Z79899 Other long term (current) drug therapy: Secondary | ICD-10-CM | POA: Diagnosis not present

## 2020-09-20 DIAGNOSIS — Z5111 Encounter for antineoplastic chemotherapy: Secondary | ICD-10-CM | POA: Diagnosis not present

## 2020-09-20 DIAGNOSIS — Z7982 Long term (current) use of aspirin: Secondary | ICD-10-CM | POA: Diagnosis not present

## 2020-09-20 MED ORDER — HEPARIN SOD (PORK) LOCK FLUSH 100 UNIT/ML IV SOLN
500.0000 [IU] | Freq: Once | INTRAVENOUS | Status: AC | PRN
  Administered 2020-09-20: 11:00:00 500 [IU]
  Filled 2020-09-20: qty 5

## 2020-09-20 MED ORDER — SODIUM CHLORIDE 0.9 % IV SOLN
75.0000 mg/m2 | Freq: Once | INTRAVENOUS | Status: AC
  Administered 2020-09-20: 10:00:00 135 mg via INTRAVENOUS
  Filled 2020-09-20: qty 13.5

## 2020-09-20 MED ORDER — DEXAMETHASONE SODIUM PHOSPHATE 100 MG/10ML IJ SOLN
10.0000 mg | Freq: Once | INTRAMUSCULAR | Status: AC
  Administered 2020-09-20: 09:00:00 10 mg via INTRAVENOUS
  Filled 2020-09-20: qty 10

## 2020-09-20 MED ORDER — SODIUM CHLORIDE 0.9 % IV SOLN
Freq: Once | INTRAVENOUS | Status: AC
  Filled 2020-09-20: qty 250

## 2020-09-20 MED ORDER — SODIUM CHLORIDE 0.9% FLUSH
10.0000 mL | INTRAVENOUS | Status: DC | PRN
  Administered 2020-09-20: 11:00:00 10 mL
  Filled 2020-09-20: qty 10

## 2020-09-20 NOTE — Patient Instructions (Signed)
[facility] Discharge Instructions for Patients Receiving Chemotherapy  Today you received the following chemotherapy agent: Azacitidine (VIDAZA).  To help prevent nausea and vomiting after your treatment, we encourage you to take your nausea medication as directed.   If you develop nausea and vomiting that is not controlled by your nausea medication, call the clinic.   BELOW ARE SYMPTOMS THAT SHOULD BE REPORTED IMMEDIATELY:  *FEVER GREATER THAN 100.5 F  *CHILLS WITH OR WITHOUT FEVER  NAUSEA AND VOMITING THAT IS NOT CONTROLLED WITH YOUR NAUSEA MEDICATION  *UNUSUAL SHORTNESS OF BREATH  *UNUSUAL BRUISING OR BLEEDING  TENDERNESS IN MOUTH AND THROAT WITH OR WITHOUT PRESENCE OF ULCERS  *URINARY PROBLEMS  *BOWEL PROBLEMS  UNUSUAL RASH Items with * indicate a potential emergency and should be followed up as soon as possible.  Feel free to call the clinic should you have any questions or concerns. The clinic phone number is [phone].  Please show the [facility] at check-in to the Emergency Department and triage nurse.

## 2020-09-21 ENCOUNTER — Other Ambulatory Visit: Payer: Self-pay

## 2020-09-21 ENCOUNTER — Inpatient Hospital Stay: Payer: BC Managed Care – PPO

## 2020-09-21 VITALS — BP 137/90 | HR 70 | Temp 98.2°F | Resp 18

## 2020-09-21 DIAGNOSIS — Z5111 Encounter for antineoplastic chemotherapy: Secondary | ICD-10-CM | POA: Diagnosis not present

## 2020-09-21 DIAGNOSIS — Z7982 Long term (current) use of aspirin: Secondary | ICD-10-CM | POA: Diagnosis not present

## 2020-09-21 DIAGNOSIS — D46C Myelodysplastic syndrome with isolated del(5q) chromosomal abnormality: Secondary | ICD-10-CM | POA: Diagnosis not present

## 2020-09-21 DIAGNOSIS — Z79899 Other long term (current) drug therapy: Secondary | ICD-10-CM | POA: Diagnosis not present

## 2020-09-21 DIAGNOSIS — D469 Myelodysplastic syndrome, unspecified: Secondary | ICD-10-CM

## 2020-09-21 MED ORDER — SODIUM CHLORIDE 0.9% FLUSH
10.0000 mL | INTRAVENOUS | Status: DC | PRN
  Administered 2020-09-21: 15:00:00 10 mL
  Filled 2020-09-21: qty 10

## 2020-09-21 MED ORDER — AZACITIDINE CHEMO INJECTION 100 MG
75.0000 mg/m2 | Freq: Once | INTRAMUSCULAR | Status: AC
  Administered 2020-09-21: 14:00:00 135 mg via INTRAVENOUS
  Filled 2020-09-21: qty 13.5

## 2020-09-21 MED ORDER — SODIUM CHLORIDE 0.9 % IV SOLN
10.0000 mg | Freq: Once | INTRAVENOUS | Status: AC
  Administered 2020-09-21: 14:00:00 10 mg via INTRAVENOUS
  Filled 2020-09-21: qty 10

## 2020-09-21 MED ORDER — SODIUM CHLORIDE 0.9 % IV SOLN
Freq: Once | INTRAVENOUS | Status: AC
  Filled 2020-09-21: qty 250

## 2020-09-21 MED ORDER — PALONOSETRON HCL INJECTION 0.25 MG/5ML
INTRAVENOUS | Status: AC
  Filled 2020-09-21: qty 5

## 2020-09-21 MED ORDER — HEPARIN SOD (PORK) LOCK FLUSH 100 UNIT/ML IV SOLN
500.0000 [IU] | Freq: Once | INTRAVENOUS | Status: AC | PRN
  Administered 2020-09-21: 15:00:00 500 [IU]
  Filled 2020-09-21: qty 5

## 2020-09-21 MED ORDER — PALONOSETRON HCL INJECTION 0.25 MG/5ML
0.2500 mg | Freq: Once | INTRAVENOUS | Status: AC
  Administered 2020-09-21: 14:00:00 0.25 mg via INTRAVENOUS

## 2020-09-21 NOTE — Patient Instructions (Signed)
[facility] Discharge Instructions for Patients Receiving Chemotherapy  Today you received the following chemotherapy agents:  Vidaza  To help prevent nausea and vomiting after your treatment, we encourage you to take your nausea medication as prescribed.   If you develop nausea and vomiting that is not controlled by your nausea medication, call the clinic.   BELOW ARE SYMPTOMS THAT SHOULD BE REPORTED IMMEDIATELY:  *FEVER GREATER THAN 100.5 F  *CHILLS WITH OR WITHOUT FEVER  NAUSEA AND VOMITING THAT IS NOT CONTROLLED WITH YOUR NAUSEA MEDICATION  *UNUSUAL SHORTNESS OF BREATH  *UNUSUAL BRUISING OR BLEEDING  TENDERNESS IN MOUTH AND THROAT WITH OR WITHOUT PRESENCE OF ULCERS  *URINARY PROBLEMS  *BOWEL PROBLEMS  UNUSUAL RASH Items with * indicate a potential emergency and should be followed up as soon as possible.  Feel free to call the clinic should you have any questions or concerns. The clinic phone number is [phone].  Please show the CHEMO ALERT CARD at check-in to the Emergency Department and triage nurse.   

## 2020-09-22 ENCOUNTER — Inpatient Hospital Stay: Payer: BC Managed Care – PPO

## 2020-09-22 VITALS — BP 158/85 | HR 67 | Temp 98.0°F | Resp 18

## 2020-09-22 DIAGNOSIS — Z7982 Long term (current) use of aspirin: Secondary | ICD-10-CM | POA: Diagnosis not present

## 2020-09-22 DIAGNOSIS — Z79899 Other long term (current) drug therapy: Secondary | ICD-10-CM | POA: Diagnosis not present

## 2020-09-22 DIAGNOSIS — D469 Myelodysplastic syndrome, unspecified: Secondary | ICD-10-CM

## 2020-09-22 DIAGNOSIS — D46C Myelodysplastic syndrome with isolated del(5q) chromosomal abnormality: Secondary | ICD-10-CM | POA: Diagnosis not present

## 2020-09-22 DIAGNOSIS — Z5111 Encounter for antineoplastic chemotherapy: Secondary | ICD-10-CM | POA: Diagnosis not present

## 2020-09-22 MED ORDER — HEPARIN SOD (PORK) LOCK FLUSH 100 UNIT/ML IV SOLN
500.0000 [IU] | Freq: Once | INTRAVENOUS | Status: AC | PRN
  Administered 2020-09-22: 16:00:00 500 [IU]
  Filled 2020-09-22: qty 5

## 2020-09-22 MED ORDER — SODIUM CHLORIDE 0.9 % IV SOLN
75.0000 mg/m2 | Freq: Once | INTRAVENOUS | Status: AC
  Administered 2020-09-22: 16:00:00 135 mg via INTRAVENOUS
  Filled 2020-09-22: qty 13.5

## 2020-09-22 MED ORDER — SODIUM CHLORIDE 0.9 % IV SOLN
Freq: Once | INTRAVENOUS | Status: AC
  Filled 2020-09-22: qty 250

## 2020-09-22 MED ORDER — SODIUM CHLORIDE 0.9% FLUSH
10.0000 mL | INTRAVENOUS | Status: DC | PRN
  Administered 2020-09-22: 16:00:00 10 mL
  Filled 2020-09-22: qty 10

## 2020-09-22 MED ORDER — SODIUM CHLORIDE 0.9 % IV SOLN
10.0000 mg | Freq: Once | INTRAVENOUS | Status: AC
  Administered 2020-09-22: 15:00:00 10 mg via INTRAVENOUS
  Filled 2020-09-22: qty 10

## 2020-09-23 ENCOUNTER — Encounter: Payer: Self-pay | Admitting: Oncology

## 2020-09-23 ENCOUNTER — Other Ambulatory Visit: Payer: Self-pay

## 2020-09-23 ENCOUNTER — Inpatient Hospital Stay: Payer: BC Managed Care – PPO

## 2020-09-23 VITALS — BP 176/98 | HR 63 | Temp 98.5°F | Resp 18

## 2020-09-23 DIAGNOSIS — Z79899 Other long term (current) drug therapy: Secondary | ICD-10-CM | POA: Diagnosis not present

## 2020-09-23 DIAGNOSIS — Z5111 Encounter for antineoplastic chemotherapy: Secondary | ICD-10-CM | POA: Diagnosis not present

## 2020-09-23 DIAGNOSIS — Z7982 Long term (current) use of aspirin: Secondary | ICD-10-CM | POA: Diagnosis not present

## 2020-09-23 DIAGNOSIS — D46C Myelodysplastic syndrome with isolated del(5q) chromosomal abnormality: Secondary | ICD-10-CM | POA: Diagnosis not present

## 2020-09-23 DIAGNOSIS — D469 Myelodysplastic syndrome, unspecified: Secondary | ICD-10-CM

## 2020-09-23 MED ORDER — HEPARIN SOD (PORK) LOCK FLUSH 100 UNIT/ML IV SOLN
500.0000 [IU] | Freq: Once | INTRAVENOUS | Status: AC | PRN
  Administered 2020-09-23: 16:00:00 500 [IU]
  Filled 2020-09-23: qty 5

## 2020-09-23 MED ORDER — PALONOSETRON HCL INJECTION 0.25 MG/5ML
INTRAVENOUS | Status: AC
  Filled 2020-09-23: qty 5

## 2020-09-23 MED ORDER — SODIUM CHLORIDE 0.9% FLUSH
10.0000 mL | INTRAVENOUS | Status: DC | PRN
  Administered 2020-09-23: 16:00:00 10 mL
  Filled 2020-09-23: qty 10

## 2020-09-23 MED ORDER — SODIUM CHLORIDE 0.9 % IV SOLN
Freq: Once | INTRAVENOUS | Status: AC
  Filled 2020-09-23: qty 250

## 2020-09-23 MED ORDER — SODIUM CHLORIDE 0.9 % IV SOLN
10.0000 mg | Freq: Once | INTRAVENOUS | Status: AC
  Administered 2020-09-23: 14:00:00 10 mg via INTRAVENOUS
  Filled 2020-09-23: qty 10

## 2020-09-23 MED ORDER — SODIUM CHLORIDE 0.9 % IV SOLN
75.0000 mg/m2 | Freq: Once | INTRAVENOUS | Status: AC
  Administered 2020-09-23: 15:00:00 135 mg via INTRAVENOUS
  Filled 2020-09-23: qty 13.5

## 2020-09-23 MED ORDER — PALONOSETRON HCL INJECTION 0.25 MG/5ML
0.2500 mg | Freq: Once | INTRAVENOUS | Status: AC
  Administered 2020-09-23: 14:00:00 0.25 mg via INTRAVENOUS

## 2020-09-23 NOTE — Patient Instructions (Signed)
[facility] Discharge Instructions for Patients Receiving Chemotherapy  Today you received the following chemotherapy agent: Vidaza  To help prevent nausea and vomiting after your treatment, we encourage you to take your nausea medication as prescribed.   If you develop nausea and vomiting that is not controlled by your nausea medication, call the clinic.   BELOW ARE SYMPTOMS THAT SHOULD BE REPORTED IMMEDIATELY:  *FEVER GREATER THAN 100.5 F  *CHILLS WITH OR WITHOUT FEVER  NAUSEA AND VOMITING THAT IS NOT CONTROLLED WITH YOUR NAUSEA MEDICATION  *UNUSUAL SHORTNESS OF BREATH  *UNUSUAL BRUISING OR BLEEDING  TENDERNESS IN MOUTH AND THROAT WITH OR WITHOUT PRESENCE OF ULCERS  *URINARY PROBLEMS  *BOWEL PROBLEMS  UNUSUAL RASH Items with * indicate a potential emergency and should be followed up as soon as possible.  Feel free to call the clinic should you have any questions or concerns. The clinic phone number is [phone].  Please show the CHEMO ALERT CARD at check-in to the Emergency Department and triage nurse.   

## 2020-09-26 ENCOUNTER — Other Ambulatory Visit: Payer: Self-pay | Admitting: Radiology

## 2020-09-26 DIAGNOSIS — N641 Fat necrosis of breast: Secondary | ICD-10-CM | POA: Diagnosis not present

## 2020-09-26 DIAGNOSIS — N6315 Unspecified lump in the right breast, overlapping quadrants: Secondary | ICD-10-CM | POA: Diagnosis not present

## 2020-09-30 ENCOUNTER — Other Ambulatory Visit: Payer: Self-pay | Admitting: Oncology

## 2020-10-03 ENCOUNTER — Other Ambulatory Visit: Payer: Self-pay | Admitting: Oncology

## 2020-10-04 NOTE — Progress Notes (Signed)
[facility] Summary Report / Loop Recorder 

## 2020-10-09 ENCOUNTER — Other Ambulatory Visit: Payer: Self-pay | Admitting: Oncology

## 2020-10-10 DIAGNOSIS — Z510 Encounter for antineoplastic radiation therapy: Secondary | ICD-10-CM | POA: Diagnosis not present

## 2020-10-10 DIAGNOSIS — D46Z Other myelodysplastic syndromes: Secondary | ICD-10-CM | POA: Diagnosis not present

## 2020-10-10 DIAGNOSIS — D469 Myelodysplastic syndrome, unspecified: Secondary | ICD-10-CM | POA: Diagnosis not present

## 2020-10-10 DIAGNOSIS — D4620 Refractory anemia with excess of blasts, unspecified: Secondary | ICD-10-CM | POA: Diagnosis not present

## 2020-10-11 DIAGNOSIS — D469 Myelodysplastic syndrome, unspecified: Secondary | ICD-10-CM | POA: Diagnosis not present

## 2020-10-11 DIAGNOSIS — Z510 Encounter for antineoplastic radiation therapy: Secondary | ICD-10-CM | POA: Diagnosis not present

## 2020-10-12 ENCOUNTER — Encounter: Payer: Self-pay | Admitting: Internal Medicine

## 2020-10-12 ENCOUNTER — Ambulatory Visit (INDEPENDENT_AMBULATORY_CARE_PROVIDER_SITE_OTHER): Payer: BC Managed Care – PPO | Admitting: Internal Medicine

## 2020-10-12 ENCOUNTER — Other Ambulatory Visit: Payer: Self-pay

## 2020-10-12 VITALS — BP 134/78 | HR 66 | Ht 63.0 in | Wt 182.0 lb

## 2020-10-12 DIAGNOSIS — I639 Cerebral infarction, unspecified: Secondary | ICD-10-CM | POA: Diagnosis not present

## 2020-10-12 HISTORY — PX: OTHER SURGICAL HISTORY: SHX169

## 2020-10-12 NOTE — Progress Notes (Signed)
PCP: [name], MD   Primary EP: Dr Rayann Heman  Breanna Perry is a 65 y.o. female who presents today for routine electrophysiology followup.  Since last being seen in our clinic, the patient has been diagnosed with MDS.  She is awaiting stem cell transplant and total body irradiation.  Her treating physicians have advised that her ILR be removed.  Today, she denies symptoms of palpitations, chest pain, shortness of breath,  lower extremity edema, dizziness, presyncope, or syncope.  The patient is otherwise without complaint today.   Past Medical History:  Diagnosis Date  . Anemia   . Anxiety   . Dental bridge present    upper and lower  . Depression   . Hypertension    under control with med., has been on med. x 8 yr.  . Leukocytopenia   . Medial meniscus tear 12/2012   left  . Sjogren's syndrome ([place])   . Stroke ([place])   . Wears glasses    reading   Past Surgical History:  Procedure Laterality Date  . APPENDECTOMY  04/1991  . BIOPSY  11/20/2019   Procedure: BIOPSY;  Surgeon: Ronnette Juniper, MD;  Location: [facility];  Service: Gastroenterology;;  . COLONOSCOPY    . COLONOSCOPY WITH PROPOFOL N/A 11/20/2019   Procedure: COLONOSCOPY WITH PROPOFOL;  Surgeon: Ronnette Juniper, MD;  Location: [facility];  Service: Gastroenterology;  Laterality: N/A;  . ESOPHAGOGASTRODUODENOSCOPY (EGD) WITH PROPOFOL N/A 11/20/2019   Procedure: ESOPHAGOGASTRODUODENOSCOPY (EGD) WITH PROPOFOL;  Surgeon: Ronnette Juniper, MD;  Location: [facility];  Service: Gastroenterology;  Laterality: N/A;  . IR IMAGING GUIDED PORT INSERTION  01/19/2020  . KNEE ARTHROSCOPY Left 01/07/2013   Procedure: LEFT KNEE ARTHROSCOPY PARTIAL MEDIAL MENISECTOMY CHRONDOPLASTY MEDIAL PLICA RESECTION;  Surgeon: Alta Corning, MD;  Location: [facility];  Service: Orthopedics;  Laterality: Left;  . KNEE SURGERY Left 06/22/2015  . LIVER BIOPSY  summer 2011  . LOOP RECORDER INSERTION N/A 10/21/2018   Procedure: LOOP RECORDER  INSERTION;  Surgeon: Thompson Grayer, MD;  Location: [place] [facility];  Service: Cardiovascular;  Laterality: N/A;  . LUNG BIOPSY  summer 2011  . PARTIAL HYSTERECTOMY  04/1991   partial  . TEE WITHOUT CARDIOVERSION N/A 10/21/2018   Procedure: TRANSESOPHAGEAL ECHOCARDIOGRAM (TEE);  Surgeon: Elouise Munroe, MD;  Location: [facility];  Service: Cardiology;  Laterality: N/A;  possible loop recorder    ROS- all systems are reviewed and negatives except as per HPI above  Current Outpatient Medications  Medication Sig Dispense Refill  . allopurinol (ZYLOPRIM) 300 MG tablet TAKE 1 TABLET (300 MG TOTAL) BY MOUTH 2 (TWO) TIMES DAILY. 60 tablet 0  . ARIPiprazole (ABILIFY) 5 MG tablet 5 mg.    . aspirin EC 81 MG EC tablet Take 1 tablet (81 mg total) by mouth daily.    Marland Kitchen atorvastatin (LIPITOR) 40 MG tablet Take 1 tablet (40 mg total) by mouth daily at 6 PM. (Patient taking differently: Take 40 mg by mouth daily.) 30 tablet 2  . busPIRone (BUSPAR) 10 MG tablet Take 10 mg by mouth 2 (two) times daily.    . citalopram (CELEXA) 40 MG tablet Take 40 mg by mouth daily.   1  . colesevelam (WELCHOL) 625 MG tablet Take 1,875 mg by mouth 2 (two) times daily.    . EQ GENTLE LAXATIVE 5 MG EC tablet See admin instructions.     . Eszopiclone 3 MG TABS Take 3 mg by mouth at bedtime as needed (sleep).     Marland Kitchen  KLOR-CON M20 20 MEQ tablet TAKE 1 TABLET BY MOUTH TWICE A DAY 30 tablet 0  . lidocaine-prilocaine (EMLA) cream Apply 1 application topically as needed. 30 g 0  . metoprolol succinate (TOPROL-XL) 50 MG 24 hr tablet Take 50 mg by mouth.    . pantoprazole (PROTONIX) 40 MG tablet Take 1 tablet (40 mg total) by mouth daily. 60 tablet 1  . prochlorperazine (COMPAZINE) 10 MG tablet Take 1 tablet (10 mg total) by mouth every 6 (six) hours as needed for nausea or vomiting. 30 tablet 0   No current facility-administered medications for this visit.    Physical Exam: Vitals:   10/12/20 0915  BP: 134/78  Pulse: 66   SpO2: 97%  Weight: 182 lb (82.6 kg)  Height: 5\' 3"  (1.6 m)    GEN- The patient is well appearing, alert and oriented x 3 today.   Head- normocephalic, atraumatic Eyes-  Sclera clear, conjunctiva pink Ears- hearing intact Oropharynx- clear Lungs- Clear to ausculation bilaterally, normal work of breathing Heart- Regular rate and rhythm, no murmurs, rubs or gallops, PMI not laterally displaced GI- soft, NT, ND, + BS Extremities- no clubbing, cyanosis, or edema  Wt Readings from Last 3 Encounters:  10/12/20 182 lb (82.6 kg)  09/19/20 172 lb 1.6 oz (78.1 kg)  08/22/20 176 lb 12.8 oz (80.2 kg)    EKG tracing ordered today is personally reviewed and shows sinus rhythm  Assessment and Plan:  1. Cryptogenic stroke We have monitored with ILR for a year, without afib/ arrhythmias observed. She is awaiting stem cell transplant and her treating team has advised that she have her ILR removed.  The patient wishes to have her ILR removed at this time.  Risks, benefits, and alternatives to ILR removal were discussed at length.  She understands risks include bleeding and infection.  She also understands that AF will no longer be detected without her ILR in place. She wishes to proceed.  08/24/20 MD, [facility] 10/12/2020 9:30 AM    PROCEDURES:   1. Implantable loop recorder explantation       DESCRIPTION OF PROCEDURE:  Informed written consent was obtained.  The patient required no sedation for the procedure today.   The patients left chest was therefore prepped and draped in the usual sterile fashion.  The skin overlying the ILR monitor was infiltrated with lidocaine for local analgesia.  A 0.5-cm incision was made over the site.  The previously implanted ILR was exposed and removed using a combination of sharp and blunt dissection.  Steri- Strips and a sterile dressing were then applied. EBL<53ml.  There were no early apparent complications.     CONCLUSIONS:   1. Successful explantation of a  [employer] Reveal LINQ implantable loop recorder   2. No early apparent complications.        0m MD, [facility] 10/12/2020 9:40 AM

## 2020-10-12 NOTE — Patient Instructions (Signed)
Medication Instructions:  Your physician recommends that you continue on your current medications as directed. Please refer to the Current Medication list given to you today.  Labwork: None ordered.  Testing/Procedures: None ordered.   Your physician wants you to follow-up in: as needed with Dr. Allred.     Implantable Loop Recorder Removal, Care After This sheet gives you information about how to care for yourself after your procedure. Your health care provider may also give you more specific instructions. If you have problems or questions, contact your health care provider. What can I expect after the procedure? After the procedure, it is common to have:  Soreness or discomfort near the incision.  Some swelling or bruising near the incision.  Follow these instructions at home: Incision care  1.  Leave your outer dressing on for 24 hours.  After 24 hours you can remove your outer dressing and shower. 2. Leave adhesive strips in place. These skin closures may need to stay in place for 1-2 weeks. If adhesive strip edges start to loosen and curl up, you may trim the loose edges.  You may remove the strips if they have not fallen off after 2 weeks. 3. Check your incision area every day for signs of infection. Check for: a. Redness, swelling, or pain. b. Fluid or blood. c. Warmth. d. Pus or a bad smell. 4. Do not take baths, swim, or use a hot tub until your incision is completely healed. 5. If your wound site starts to bleed apply pressure.      If you have any questions/concerns please call the device clinic at [phone].  Activity  Return to your normal activities.  Contact a health care provider if:  You have redness, swelling, or pain around your incision.  You have a fever.   

## 2020-10-13 ENCOUNTER — Ambulatory Visit (INDEPENDENT_AMBULATORY_CARE_PROVIDER_SITE_OTHER): Payer: BC Managed Care – PPO | Admitting: Emergency Medicine

## 2020-10-13 ENCOUNTER — Telehealth: Payer: Self-pay

## 2020-10-13 DIAGNOSIS — I639 Cerebral infarction, unspecified: Secondary | ICD-10-CM

## 2020-10-13 NOTE — Progress Notes (Signed)
Edges are not well approximated. Steri-stripes applied to site. Advised patient to keep site dry for the next 24 hours to hopefully allow steri-strips to stick well. Advised to call with further questions or concerns. Verbalized understanding.

## 2020-10-13 NOTE — Telephone Encounter (Signed)
The pt left a message that she had her loop removed and her steri strips fell off. She would like for the nurse to give her a call back at [phone].

## 2020-10-13 NOTE — Patient Instructions (Signed)
Please call with any further questions or concerns.  Device Clinic [phone] 

## 2020-10-13 NOTE — Telephone Encounter (Signed)
Returning phone call. Patient report steri-stripes have fallen off. Advised patient to make sure site stays clean and apply band aid over site until we get her in the office. Apt. Made to at 2:00.

## 2020-10-14 DIAGNOSIS — D4621 Refractory anemia with excess of blasts 1: Secondary | ICD-10-CM | POA: Diagnosis not present

## 2020-10-14 DIAGNOSIS — D469 Myelodysplastic syndrome, unspecified: Secondary | ICD-10-CM | POA: Diagnosis not present

## 2020-10-14 DIAGNOSIS — D46Z Other myelodysplastic syndromes: Secondary | ICD-10-CM | POA: Diagnosis not present

## 2020-10-14 DIAGNOSIS — Z510 Encounter for antineoplastic radiation therapy: Secondary | ICD-10-CM | POA: Diagnosis not present

## 2020-10-17 ENCOUNTER — Encounter: Payer: No Typology Code available for payment source | Admitting: Internal Medicine

## 2020-10-19 ENCOUNTER — Other Ambulatory Visit: Payer: Self-pay | Admitting: Oncology

## 2020-10-19 ENCOUNTER — Encounter: Payer: No Typology Code available for payment source | Admitting: Internal Medicine

## 2020-11-03 ENCOUNTER — Other Ambulatory Visit: Payer: Self-pay | Admitting: Oncology

## 2020-11-07 ENCOUNTER — Other Ambulatory Visit: Payer: Self-pay | Admitting: Oncology

## 2020-11-18 DIAGNOSIS — D61810 Antineoplastic chemotherapy induced pancytopenia: Secondary | ICD-10-CM | POA: Diagnosis not present

## 2020-11-18 DIAGNOSIS — Z9484 Stem cells transplant status: Secondary | ICD-10-CM | POA: Diagnosis not present

## 2020-11-18 DIAGNOSIS — D573 Sickle-cell trait: Secondary | ICD-10-CM | POA: Diagnosis not present

## 2020-11-18 DIAGNOSIS — T451X5A Adverse effect of antineoplastic and immunosuppressive drugs, initial encounter: Secondary | ICD-10-CM | POA: Diagnosis not present

## 2020-11-18 DIAGNOSIS — D469 Myelodysplastic syndrome, unspecified: Secondary | ICD-10-CM | POA: Diagnosis not present

## 2020-11-19 DIAGNOSIS — Z9484 Stem cells transplant status: Secondary | ICD-10-CM | POA: Diagnosis not present

## 2020-11-19 DIAGNOSIS — D469 Myelodysplastic syndrome, unspecified: Secondary | ICD-10-CM | POA: Diagnosis not present

## 2020-11-19 DIAGNOSIS — T451X5A Adverse effect of antineoplastic and immunosuppressive drugs, initial encounter: Secondary | ICD-10-CM | POA: Diagnosis not present

## 2020-11-19 DIAGNOSIS — D61810 Antineoplastic chemotherapy induced pancytopenia: Secondary | ICD-10-CM | POA: Diagnosis not present

## 2020-11-19 DIAGNOSIS — D573 Sickle-cell trait: Secondary | ICD-10-CM | POA: Diagnosis not present

## 2020-11-20 DIAGNOSIS — D469 Myelodysplastic syndrome, unspecified: Secondary | ICD-10-CM | POA: Diagnosis not present

## 2020-11-20 DIAGNOSIS — D61810 Antineoplastic chemotherapy induced pancytopenia: Secondary | ICD-10-CM | POA: Diagnosis not present

## 2020-11-20 DIAGNOSIS — T451X5A Adverse effect of antineoplastic and immunosuppressive drugs, initial encounter: Secondary | ICD-10-CM | POA: Diagnosis not present

## 2020-11-20 DIAGNOSIS — D573 Sickle-cell trait: Secondary | ICD-10-CM | POA: Diagnosis not present

## 2020-11-21 DIAGNOSIS — F329 Major depressive disorder, single episode, unspecified: Secondary | ICD-10-CM | POA: Diagnosis not present

## 2020-11-21 DIAGNOSIS — G4700 Insomnia, unspecified: Secondary | ICD-10-CM | POA: Diagnosis not present

## 2020-11-21 DIAGNOSIS — D46Z Other myelodysplastic syndromes: Secondary | ICD-10-CM | POA: Diagnosis not present

## 2020-11-21 DIAGNOSIS — M3500 Sicca syndrome, unspecified: Secondary | ICD-10-CM | POA: Diagnosis not present

## 2020-11-22 ENCOUNTER — Other Ambulatory Visit: Payer: Self-pay | Admitting: Oncology

## 2020-11-22 DIAGNOSIS — Z9484 Stem cells transplant status: Secondary | ICD-10-CM | POA: Diagnosis not present

## 2020-11-22 DIAGNOSIS — D61810 Antineoplastic chemotherapy induced pancytopenia: Secondary | ICD-10-CM | POA: Diagnosis not present

## 2020-11-22 DIAGNOSIS — D469 Myelodysplastic syndrome, unspecified: Secondary | ICD-10-CM | POA: Diagnosis not present

## 2020-11-22 DIAGNOSIS — T451X5A Adverse effect of antineoplastic and immunosuppressive drugs, initial encounter: Secondary | ICD-10-CM | POA: Diagnosis not present

## 2020-11-22 DIAGNOSIS — D46Z Other myelodysplastic syndromes: Secondary | ICD-10-CM | POA: Diagnosis not present

## 2020-11-22 DIAGNOSIS — Z510 Encounter for antineoplastic radiation therapy: Secondary | ICD-10-CM | POA: Diagnosis not present

## 2020-11-22 DIAGNOSIS — D4620 Refractory anemia with excess of blasts, unspecified: Secondary | ICD-10-CM | POA: Diagnosis not present

## 2020-11-23 DIAGNOSIS — D573 Sickle-cell trait: Secondary | ICD-10-CM | POA: Diagnosis not present

## 2020-11-23 DIAGNOSIS — D61810 Antineoplastic chemotherapy induced pancytopenia: Secondary | ICD-10-CM | POA: Diagnosis not present

## 2020-11-23 DIAGNOSIS — D469 Myelodysplastic syndrome, unspecified: Secondary | ICD-10-CM | POA: Diagnosis not present

## 2020-11-23 DIAGNOSIS — T451X5A Adverse effect of antineoplastic and immunosuppressive drugs, initial encounter: Secondary | ICD-10-CM | POA: Diagnosis not present

## 2020-11-24 DIAGNOSIS — D46Z Other myelodysplastic syndromes: Secondary | ICD-10-CM | POA: Diagnosis not present

## 2020-11-24 DIAGNOSIS — D469 Myelodysplastic syndrome, unspecified: Secondary | ICD-10-CM | POA: Diagnosis not present

## 2020-11-24 DIAGNOSIS — D61810 Antineoplastic chemotherapy induced pancytopenia: Secondary | ICD-10-CM | POA: Diagnosis not present

## 2020-11-24 DIAGNOSIS — T451X5A Adverse effect of antineoplastic and immunosuppressive drugs, initial encounter: Secondary | ICD-10-CM | POA: Diagnosis not present

## 2020-11-24 DIAGNOSIS — D573 Sickle-cell trait: Secondary | ICD-10-CM | POA: Diagnosis not present

## 2020-11-24 DIAGNOSIS — R918 Other nonspecific abnormal finding of lung field: Secondary | ICD-10-CM | POA: Diagnosis not present

## 2020-11-25 DIAGNOSIS — D469 Myelodysplastic syndrome, unspecified: Secondary | ICD-10-CM | POA: Diagnosis not present

## 2020-11-25 DIAGNOSIS — D61810 Antineoplastic chemotherapy induced pancytopenia: Secondary | ICD-10-CM | POA: Diagnosis not present

## 2020-11-25 DIAGNOSIS — T451X5A Adverse effect of antineoplastic and immunosuppressive drugs, initial encounter: Secondary | ICD-10-CM | POA: Diagnosis not present

## 2020-11-25 DIAGNOSIS — D573 Sickle-cell trait: Secondary | ICD-10-CM | POA: Diagnosis not present

## 2020-11-26 DIAGNOSIS — T451X5A Adverse effect of antineoplastic and immunosuppressive drugs, initial encounter: Secondary | ICD-10-CM | POA: Diagnosis not present

## 2020-11-26 DIAGNOSIS — D61810 Antineoplastic chemotherapy induced pancytopenia: Secondary | ICD-10-CM | POA: Diagnosis not present

## 2020-11-26 DIAGNOSIS — D573 Sickle-cell trait: Secondary | ICD-10-CM | POA: Diagnosis not present

## 2020-11-26 DIAGNOSIS — D469 Myelodysplastic syndrome, unspecified: Secondary | ICD-10-CM | POA: Diagnosis not present

## 2020-11-27 DIAGNOSIS — D61810 Antineoplastic chemotherapy induced pancytopenia: Secondary | ICD-10-CM | POA: Diagnosis not present

## 2020-11-27 DIAGNOSIS — T451X5A Adverse effect of antineoplastic and immunosuppressive drugs, initial encounter: Secondary | ICD-10-CM | POA: Diagnosis not present

## 2020-11-27 DIAGNOSIS — D573 Sickle-cell trait: Secondary | ICD-10-CM | POA: Diagnosis not present

## 2020-11-27 DIAGNOSIS — D469 Myelodysplastic syndrome, unspecified: Secondary | ICD-10-CM | POA: Diagnosis not present

## 2020-11-28 DIAGNOSIS — T451X5A Adverse effect of antineoplastic and immunosuppressive drugs, initial encounter: Secondary | ICD-10-CM | POA: Diagnosis not present

## 2020-11-28 DIAGNOSIS — Z9484 Stem cells transplant status: Secondary | ICD-10-CM | POA: Diagnosis not present

## 2020-11-28 DIAGNOSIS — D469 Myelodysplastic syndrome, unspecified: Secondary | ICD-10-CM | POA: Diagnosis not present

## 2020-11-28 DIAGNOSIS — D61810 Antineoplastic chemotherapy induced pancytopenia: Secondary | ICD-10-CM | POA: Diagnosis not present

## 2020-11-29 DIAGNOSIS — Z95828 Presence of other vascular implants and grafts: Secondary | ICD-10-CM | POA: Diagnosis not present

## 2020-11-29 DIAGNOSIS — T451X5A Adverse effect of antineoplastic and immunosuppressive drugs, initial encounter: Secondary | ICD-10-CM | POA: Diagnosis not present

## 2020-11-29 DIAGNOSIS — D61810 Antineoplastic chemotherapy induced pancytopenia: Secondary | ICD-10-CM | POA: Diagnosis not present

## 2020-11-29 DIAGNOSIS — D469 Myelodysplastic syndrome, unspecified: Secondary | ICD-10-CM | POA: Diagnosis not present

## 2020-11-30 DIAGNOSIS — T451X5A Adverse effect of antineoplastic and immunosuppressive drugs, initial encounter: Secondary | ICD-10-CM | POA: Diagnosis not present

## 2020-11-30 DIAGNOSIS — Z9484 Stem cells transplant status: Secondary | ICD-10-CM | POA: Diagnosis not present

## 2020-11-30 DIAGNOSIS — D469 Myelodysplastic syndrome, unspecified: Secondary | ICD-10-CM | POA: Diagnosis not present

## 2020-11-30 DIAGNOSIS — D61810 Antineoplastic chemotherapy induced pancytopenia: Secondary | ICD-10-CM | POA: Diagnosis not present

## 2020-12-01 DIAGNOSIS — Z9484 Stem cells transplant status: Secondary | ICD-10-CM | POA: Diagnosis not present

## 2020-12-01 DIAGNOSIS — T451X5A Adverse effect of antineoplastic and immunosuppressive drugs, initial encounter: Secondary | ICD-10-CM | POA: Diagnosis not present

## 2020-12-01 DIAGNOSIS — D469 Myelodysplastic syndrome, unspecified: Secondary | ICD-10-CM | POA: Diagnosis not present

## 2020-12-01 DIAGNOSIS — D61810 Antineoplastic chemotherapy induced pancytopenia: Secondary | ICD-10-CM | POA: Diagnosis not present

## 2020-12-02 DIAGNOSIS — D469 Myelodysplastic syndrome, unspecified: Secondary | ICD-10-CM | POA: Diagnosis not present

## 2020-12-02 DIAGNOSIS — Z95828 Presence of other vascular implants and grafts: Secondary | ICD-10-CM | POA: Diagnosis not present

## 2020-12-02 DIAGNOSIS — D61810 Antineoplastic chemotherapy induced pancytopenia: Secondary | ICD-10-CM | POA: Diagnosis not present

## 2020-12-02 DIAGNOSIS — T451X5A Adverse effect of antineoplastic and immunosuppressive drugs, initial encounter: Secondary | ICD-10-CM | POA: Diagnosis not present

## 2020-12-03 DIAGNOSIS — Z9484 Stem cells transplant status: Secondary | ICD-10-CM | POA: Diagnosis not present

## 2020-12-03 DIAGNOSIS — T451X5A Adverse effect of antineoplastic and immunosuppressive drugs, initial encounter: Secondary | ICD-10-CM | POA: Diagnosis not present

## 2020-12-03 DIAGNOSIS — D469 Myelodysplastic syndrome, unspecified: Secondary | ICD-10-CM | POA: Diagnosis not present

## 2020-12-03 DIAGNOSIS — D61810 Antineoplastic chemotherapy induced pancytopenia: Secondary | ICD-10-CM | POA: Diagnosis not present

## 2020-12-04 DIAGNOSIS — G4700 Insomnia, unspecified: Secondary | ICD-10-CM | POA: Diagnosis not present

## 2020-12-04 DIAGNOSIS — D469 Myelodysplastic syndrome, unspecified: Secondary | ICD-10-CM | POA: Diagnosis not present

## 2020-12-04 DIAGNOSIS — T451X5A Adverse effect of antineoplastic and immunosuppressive drugs, initial encounter: Secondary | ICD-10-CM | POA: Diagnosis not present

## 2020-12-04 DIAGNOSIS — D61810 Antineoplastic chemotherapy induced pancytopenia: Secondary | ICD-10-CM | POA: Diagnosis not present

## 2020-12-05 ENCOUNTER — Other Ambulatory Visit: Payer: Self-pay | Admitting: Oncology

## 2020-12-05 DIAGNOSIS — T451X5A Adverse effect of antineoplastic and immunosuppressive drugs, initial encounter: Secondary | ICD-10-CM | POA: Diagnosis not present

## 2020-12-05 DIAGNOSIS — D469 Myelodysplastic syndrome, unspecified: Secondary | ICD-10-CM | POA: Diagnosis not present

## 2020-12-05 DIAGNOSIS — D46Z Other myelodysplastic syndromes: Secondary | ICD-10-CM | POA: Diagnosis not present

## 2020-12-05 DIAGNOSIS — D61810 Antineoplastic chemotherapy induced pancytopenia: Secondary | ICD-10-CM | POA: Diagnosis not present

## 2020-12-06 DIAGNOSIS — D46Z Other myelodysplastic syndromes: Secondary | ICD-10-CM | POA: Diagnosis not present

## 2020-12-06 DIAGNOSIS — D61810 Antineoplastic chemotherapy induced pancytopenia: Secondary | ICD-10-CM | POA: Diagnosis not present

## 2020-12-06 DIAGNOSIS — T451X5A Adverse effect of antineoplastic and immunosuppressive drugs, initial encounter: Secondary | ICD-10-CM | POA: Diagnosis not present

## 2020-12-06 DIAGNOSIS — D469 Myelodysplastic syndrome, unspecified: Secondary | ICD-10-CM | POA: Diagnosis not present

## 2020-12-07 ENCOUNTER — Other Ambulatory Visit: Payer: Self-pay | Admitting: Oncology

## 2020-12-07 DIAGNOSIS — D61810 Antineoplastic chemotherapy induced pancytopenia: Secondary | ICD-10-CM | POA: Diagnosis not present

## 2020-12-07 DIAGNOSIS — T451X5A Adverse effect of antineoplastic and immunosuppressive drugs, initial encounter: Secondary | ICD-10-CM | POA: Diagnosis not present

## 2020-12-07 DIAGNOSIS — D469 Myelodysplastic syndrome, unspecified: Secondary | ICD-10-CM | POA: Diagnosis not present

## 2020-12-07 DIAGNOSIS — D46Z Other myelodysplastic syndromes: Secondary | ICD-10-CM | POA: Diagnosis not present

## 2020-12-08 DIAGNOSIS — D46Z Other myelodysplastic syndromes: Secondary | ICD-10-CM | POA: Diagnosis not present

## 2020-12-08 DIAGNOSIS — T451X5A Adverse effect of antineoplastic and immunosuppressive drugs, initial encounter: Secondary | ICD-10-CM | POA: Diagnosis not present

## 2020-12-08 DIAGNOSIS — D61810 Antineoplastic chemotherapy induced pancytopenia: Secondary | ICD-10-CM | POA: Diagnosis not present

## 2020-12-08 DIAGNOSIS — D469 Myelodysplastic syndrome, unspecified: Secondary | ICD-10-CM | POA: Diagnosis not present

## 2020-12-09 DIAGNOSIS — D61810 Antineoplastic chemotherapy induced pancytopenia: Secondary | ICD-10-CM | POA: Diagnosis not present

## 2020-12-09 DIAGNOSIS — D469 Myelodysplastic syndrome, unspecified: Secondary | ICD-10-CM | POA: Diagnosis not present

## 2020-12-09 DIAGNOSIS — M25511 Pain in right shoulder: Secondary | ICD-10-CM | POA: Diagnosis not present

## 2020-12-09 DIAGNOSIS — T451X5A Adverse effect of antineoplastic and immunosuppressive drugs, initial encounter: Secondary | ICD-10-CM | POA: Diagnosis not present

## 2020-12-09 DIAGNOSIS — D46Z Other myelodysplastic syndromes: Secondary | ICD-10-CM | POA: Diagnosis not present

## 2020-12-10 DIAGNOSIS — D469 Myelodysplastic syndrome, unspecified: Secondary | ICD-10-CM | POA: Diagnosis not present

## 2020-12-10 DIAGNOSIS — T451X5A Adverse effect of antineoplastic and immunosuppressive drugs, initial encounter: Secondary | ICD-10-CM | POA: Diagnosis not present

## 2020-12-10 DIAGNOSIS — D61810 Antineoplastic chemotherapy induced pancytopenia: Secondary | ICD-10-CM | POA: Diagnosis not present

## 2020-12-10 DIAGNOSIS — G4700 Insomnia, unspecified: Secondary | ICD-10-CM | POA: Diagnosis not present

## 2020-12-11 DIAGNOSIS — D61810 Antineoplastic chemotherapy induced pancytopenia: Secondary | ICD-10-CM | POA: Diagnosis not present

## 2020-12-11 DIAGNOSIS — D469 Myelodysplastic syndrome, unspecified: Secondary | ICD-10-CM | POA: Diagnosis not present

## 2020-12-11 DIAGNOSIS — G4700 Insomnia, unspecified: Secondary | ICD-10-CM | POA: Diagnosis not present

## 2020-12-11 DIAGNOSIS — T451X5A Adverse effect of antineoplastic and immunosuppressive drugs, initial encounter: Secondary | ICD-10-CM | POA: Diagnosis not present

## 2020-12-12 DIAGNOSIS — D46Z Other myelodysplastic syndromes: Secondary | ICD-10-CM | POA: Diagnosis not present

## 2020-12-12 DIAGNOSIS — T451X5A Adverse effect of antineoplastic and immunosuppressive drugs, initial encounter: Secondary | ICD-10-CM | POA: Diagnosis not present

## 2020-12-12 DIAGNOSIS — D469 Myelodysplastic syndrome, unspecified: Secondary | ICD-10-CM | POA: Diagnosis not present

## 2020-12-12 DIAGNOSIS — D61810 Antineoplastic chemotherapy induced pancytopenia: Secondary | ICD-10-CM | POA: Diagnosis not present

## 2020-12-13 DIAGNOSIS — Z9484 Stem cells transplant status: Secondary | ICD-10-CM | POA: Diagnosis not present

## 2020-12-13 DIAGNOSIS — D46Z Other myelodysplastic syndromes: Secondary | ICD-10-CM | POA: Diagnosis not present

## 2020-12-13 DIAGNOSIS — T451X5A Adverse effect of antineoplastic and immunosuppressive drugs, initial encounter: Secondary | ICD-10-CM | POA: Diagnosis not present

## 2020-12-13 DIAGNOSIS — R509 Fever, unspecified: Secondary | ICD-10-CM | POA: Diagnosis not present

## 2020-12-13 DIAGNOSIS — D61810 Antineoplastic chemotherapy induced pancytopenia: Secondary | ICD-10-CM | POA: Diagnosis not present

## 2020-12-13 DIAGNOSIS — D469 Myelodysplastic syndrome, unspecified: Secondary | ICD-10-CM | POA: Diagnosis not present

## 2020-12-15 DIAGNOSIS — M25551 Pain in right hip: Secondary | ICD-10-CM | POA: Diagnosis not present

## 2020-12-15 DIAGNOSIS — R933 Abnormal findings on diagnostic imaging of other parts of digestive tract: Secondary | ICD-10-CM | POA: Diagnosis not present

## 2020-12-15 DIAGNOSIS — Z20822 Contact with and (suspected) exposure to covid-19: Secondary | ICD-10-CM | POA: Diagnosis not present

## 2020-12-15 DIAGNOSIS — R5381 Other malaise: Secondary | ICD-10-CM | POA: Diagnosis not present

## 2020-12-15 DIAGNOSIS — Z79899 Other long term (current) drug therapy: Secondary | ICD-10-CM | POA: Diagnosis not present

## 2020-12-15 DIAGNOSIS — Z9484 Stem cells transplant status: Secondary | ICD-10-CM | POA: Diagnosis not present

## 2020-12-15 DIAGNOSIS — M542 Cervicalgia: Secondary | ICD-10-CM | POA: Diagnosis not present

## 2020-12-15 DIAGNOSIS — M79652 Pain in left thigh: Secondary | ICD-10-CM | POA: Diagnosis not present

## 2020-12-15 DIAGNOSIS — M3500 Sicca syndrome, unspecified: Secondary | ICD-10-CM | POA: Diagnosis not present

## 2020-12-15 DIAGNOSIS — D46Z Other myelodysplastic syndromes: Secondary | ICD-10-CM | POA: Diagnosis not present

## 2020-12-15 DIAGNOSIS — D469 Myelodysplastic syndrome, unspecified: Secondary | ICD-10-CM | POA: Diagnosis not present

## 2020-12-15 DIAGNOSIS — N189 Chronic kidney disease, unspecified: Secondary | ICD-10-CM | POA: Diagnosis not present

## 2020-12-15 DIAGNOSIS — M25552 Pain in left hip: Secondary | ICD-10-CM | POA: Diagnosis not present

## 2020-12-15 DIAGNOSIS — I129 Hypertensive chronic kidney disease with stage 1 through stage 4 chronic kidney disease, or unspecified chronic kidney disease: Secondary | ICD-10-CM | POA: Diagnosis not present

## 2020-12-15 DIAGNOSIS — F329 Major depressive disorder, single episode, unspecified: Secondary | ICD-10-CM | POA: Diagnosis not present

## 2020-12-15 DIAGNOSIS — M879 Osteonecrosis, unspecified: Secondary | ICD-10-CM | POA: Diagnosis not present

## 2020-12-15 DIAGNOSIS — D72821 Monocytosis (symptomatic): Secondary | ICD-10-CM | POA: Diagnosis not present

## 2020-12-15 DIAGNOSIS — M791 Myalgia, unspecified site: Secondary | ICD-10-CM | POA: Diagnosis not present

## 2020-12-15 DIAGNOSIS — R5383 Other fatigue: Secondary | ICD-10-CM | POA: Diagnosis not present

## 2020-12-15 DIAGNOSIS — M79604 Pain in right leg: Secondary | ICD-10-CM | POA: Diagnosis not present

## 2020-12-15 DIAGNOSIS — R630 Anorexia: Secondary | ICD-10-CM | POA: Diagnosis not present

## 2020-12-15 DIAGNOSIS — D4622 Refractory anemia with excess of blasts 2: Secondary | ICD-10-CM | POA: Diagnosis not present

## 2020-12-15 DIAGNOSIS — M25561 Pain in right knee: Secondary | ICD-10-CM | POA: Diagnosis not present

## 2020-12-15 DIAGNOSIS — R509 Fever, unspecified: Secondary | ICD-10-CM | POA: Diagnosis not present

## 2020-12-15 DIAGNOSIS — Z8616 Personal history of COVID-19: Secondary | ICD-10-CM | POA: Diagnosis not present

## 2020-12-15 DIAGNOSIS — R5081 Fever presenting with conditions classified elsewhere: Secondary | ICD-10-CM | POA: Diagnosis not present

## 2020-12-15 DIAGNOSIS — E785 Hyperlipidemia, unspecified: Secondary | ICD-10-CM | POA: Diagnosis not present

## 2020-12-15 DIAGNOSIS — Z87891 Personal history of nicotine dependence: Secondary | ICD-10-CM | POA: Diagnosis not present

## 2020-12-15 DIAGNOSIS — M79651 Pain in right thigh: Secondary | ICD-10-CM | POA: Diagnosis not present

## 2020-12-15 DIAGNOSIS — D849 Immunodeficiency, unspecified: Secondary | ICD-10-CM | POA: Diagnosis not present

## 2020-12-22 ENCOUNTER — Other Ambulatory Visit: Payer: Self-pay | Admitting: Oncology

## 2020-12-22 DIAGNOSIS — F419 Anxiety disorder, unspecified: Secondary | ICD-10-CM | POA: Diagnosis not present

## 2020-12-22 DIAGNOSIS — Z8673 Personal history of transient ischemic attack (TIA), and cerebral infarction without residual deficits: Secondary | ICD-10-CM | POA: Diagnosis not present

## 2020-12-22 DIAGNOSIS — Z9484 Stem cells transplant status: Secondary | ICD-10-CM | POA: Diagnosis not present

## 2020-12-22 DIAGNOSIS — Z20822 Contact with and (suspected) exposure to covid-19: Secondary | ICD-10-CM | POA: Diagnosis not present

## 2020-12-22 DIAGNOSIS — Z48290 Encounter for aftercare following bone marrow transplant: Secondary | ICD-10-CM | POA: Diagnosis not present

## 2020-12-22 DIAGNOSIS — D46Z Other myelodysplastic syndromes: Secondary | ICD-10-CM | POA: Diagnosis not present

## 2020-12-22 DIAGNOSIS — Z79899 Other long term (current) drug therapy: Secondary | ICD-10-CM | POA: Diagnosis not present

## 2020-12-22 DIAGNOSIS — M3500 Sicca syndrome, unspecified: Secondary | ICD-10-CM | POA: Diagnosis not present

## 2020-12-23 DIAGNOSIS — N183 Chronic kidney disease, stage 3 unspecified: Secondary | ICD-10-CM | POA: Diagnosis not present

## 2020-12-23 DIAGNOSIS — R7303 Prediabetes: Secondary | ICD-10-CM | POA: Diagnosis not present

## 2020-12-23 DIAGNOSIS — D126 Benign neoplasm of colon, unspecified: Secondary | ICD-10-CM | POA: Diagnosis not present

## 2020-12-23 DIAGNOSIS — Z9481 Bone marrow transplant status: Secondary | ICD-10-CM | POA: Diagnosis not present

## 2020-12-23 DIAGNOSIS — M791 Myalgia, unspecified site: Secondary | ICD-10-CM | POA: Diagnosis not present

## 2020-12-23 DIAGNOSIS — J8409 Other alveolar and parieto-alveolar conditions: Secondary | ICD-10-CM | POA: Diagnosis not present

## 2020-12-23 DIAGNOSIS — M3500 Sicca syndrome, unspecified: Secondary | ICD-10-CM | POA: Diagnosis not present

## 2020-12-23 DIAGNOSIS — K754 Autoimmune hepatitis: Secondary | ICD-10-CM | POA: Diagnosis not present

## 2020-12-23 DIAGNOSIS — I129 Hypertensive chronic kidney disease with stage 1 through stage 4 chronic kidney disease, or unspecified chronic kidney disease: Secondary | ICD-10-CM | POA: Diagnosis not present

## 2020-12-23 DIAGNOSIS — R000 Tachycardia, unspecified: Secondary | ICD-10-CM | POA: Diagnosis not present

## 2020-12-23 DIAGNOSIS — D573 Sickle-cell trait: Secondary | ICD-10-CM | POA: Diagnosis not present

## 2020-12-23 DIAGNOSIS — D46Z Other myelodysplastic syndromes: Secondary | ICD-10-CM | POA: Diagnosis not present

## 2020-12-23 DIAGNOSIS — K449 Diaphragmatic hernia without obstruction or gangrene: Secondary | ICD-10-CM | POA: Diagnosis not present

## 2020-12-23 DIAGNOSIS — K5730 Diverticulosis of large intestine without perforation or abscess without bleeding: Secondary | ICD-10-CM | POA: Diagnosis not present

## 2020-12-23 DIAGNOSIS — K5900 Constipation, unspecified: Secondary | ICD-10-CM | POA: Diagnosis not present

## 2020-12-23 DIAGNOSIS — M170 Bilateral primary osteoarthritis of knee: Secondary | ICD-10-CM | POA: Diagnosis not present

## 2020-12-23 DIAGNOSIS — J8410 Pulmonary fibrosis, unspecified: Secondary | ICD-10-CM | POA: Diagnosis not present

## 2020-12-26 DIAGNOSIS — D46Z Other myelodysplastic syndromes: Secondary | ICD-10-CM | POA: Diagnosis not present

## 2020-12-26 DIAGNOSIS — Z9484 Stem cells transplant status: Secondary | ICD-10-CM | POA: Diagnosis not present

## 2020-12-27 DIAGNOSIS — K449 Diaphragmatic hernia without obstruction or gangrene: Secondary | ICD-10-CM | POA: Diagnosis not present

## 2020-12-27 DIAGNOSIS — I129 Hypertensive chronic kidney disease with stage 1 through stage 4 chronic kidney disease, or unspecified chronic kidney disease: Secondary | ICD-10-CM | POA: Diagnosis not present

## 2020-12-27 DIAGNOSIS — D46Z Other myelodysplastic syndromes: Secondary | ICD-10-CM | POA: Diagnosis not present

## 2020-12-27 DIAGNOSIS — N183 Chronic kidney disease, stage 3 unspecified: Secondary | ICD-10-CM | POA: Diagnosis not present

## 2020-12-27 DIAGNOSIS — R7303 Prediabetes: Secondary | ICD-10-CM | POA: Diagnosis not present

## 2020-12-27 DIAGNOSIS — R000 Tachycardia, unspecified: Secondary | ICD-10-CM | POA: Diagnosis not present

## 2020-12-27 DIAGNOSIS — K5900 Constipation, unspecified: Secondary | ICD-10-CM | POA: Diagnosis not present

## 2020-12-27 DIAGNOSIS — J8409 Other alveolar and parieto-alveolar conditions: Secondary | ICD-10-CM | POA: Diagnosis not present

## 2020-12-27 DIAGNOSIS — M791 Myalgia, unspecified site: Secondary | ICD-10-CM | POA: Diagnosis not present

## 2020-12-27 DIAGNOSIS — K754 Autoimmune hepatitis: Secondary | ICD-10-CM | POA: Diagnosis not present

## 2020-12-27 DIAGNOSIS — J8410 Pulmonary fibrosis, unspecified: Secondary | ICD-10-CM | POA: Diagnosis not present

## 2020-12-27 DIAGNOSIS — M170 Bilateral primary osteoarthritis of knee: Secondary | ICD-10-CM | POA: Diagnosis not present

## 2020-12-27 DIAGNOSIS — D126 Benign neoplasm of colon, unspecified: Secondary | ICD-10-CM | POA: Diagnosis not present

## 2020-12-27 DIAGNOSIS — M3500 Sicca syndrome, unspecified: Secondary | ICD-10-CM | POA: Diagnosis not present

## 2020-12-27 DIAGNOSIS — D573 Sickle-cell trait: Secondary | ICD-10-CM | POA: Diagnosis not present

## 2020-12-27 DIAGNOSIS — K5730 Diverticulosis of large intestine without perforation or abscess without bleeding: Secondary | ICD-10-CM | POA: Diagnosis not present

## 2020-12-29 DIAGNOSIS — Z9484 Stem cells transplant status: Secondary | ICD-10-CM | POA: Diagnosis not present

## 2020-12-29 DIAGNOSIS — D46Z Other myelodysplastic syndromes: Secondary | ICD-10-CM | POA: Diagnosis not present

## 2020-12-30 DIAGNOSIS — D573 Sickle-cell trait: Secondary | ICD-10-CM | POA: Diagnosis not present

## 2020-12-30 DIAGNOSIS — I129 Hypertensive chronic kidney disease with stage 1 through stage 4 chronic kidney disease, or unspecified chronic kidney disease: Secondary | ICD-10-CM | POA: Diagnosis not present

## 2020-12-30 DIAGNOSIS — N183 Chronic kidney disease, stage 3 unspecified: Secondary | ICD-10-CM | POA: Diagnosis not present

## 2020-12-30 DIAGNOSIS — M791 Myalgia, unspecified site: Secondary | ICD-10-CM | POA: Diagnosis not present

## 2020-12-30 DIAGNOSIS — K449 Diaphragmatic hernia without obstruction or gangrene: Secondary | ICD-10-CM | POA: Diagnosis not present

## 2020-12-30 DIAGNOSIS — K754 Autoimmune hepatitis: Secondary | ICD-10-CM | POA: Diagnosis not present

## 2020-12-30 DIAGNOSIS — K5900 Constipation, unspecified: Secondary | ICD-10-CM | POA: Diagnosis not present

## 2020-12-30 DIAGNOSIS — D126 Benign neoplasm of colon, unspecified: Secondary | ICD-10-CM | POA: Diagnosis not present

## 2020-12-30 DIAGNOSIS — J8409 Other alveolar and parieto-alveolar conditions: Secondary | ICD-10-CM | POA: Diagnosis not present

## 2020-12-30 DIAGNOSIS — M170 Bilateral primary osteoarthritis of knee: Secondary | ICD-10-CM | POA: Diagnosis not present

## 2020-12-30 DIAGNOSIS — J8410 Pulmonary fibrosis, unspecified: Secondary | ICD-10-CM | POA: Diagnosis not present

## 2020-12-30 DIAGNOSIS — R000 Tachycardia, unspecified: Secondary | ICD-10-CM | POA: Diagnosis not present

## 2020-12-30 DIAGNOSIS — K5730 Diverticulosis of large intestine without perforation or abscess without bleeding: Secondary | ICD-10-CM | POA: Diagnosis not present

## 2020-12-30 DIAGNOSIS — M3500 Sicca syndrome, unspecified: Secondary | ICD-10-CM | POA: Diagnosis not present

## 2020-12-30 DIAGNOSIS — R7303 Prediabetes: Secondary | ICD-10-CM | POA: Diagnosis not present

## 2020-12-30 DIAGNOSIS — D46Z Other myelodysplastic syndromes: Secondary | ICD-10-CM | POA: Diagnosis not present

## 2021-01-03 DIAGNOSIS — K5900 Constipation, unspecified: Secondary | ICD-10-CM | POA: Diagnosis not present

## 2021-01-03 DIAGNOSIS — D573 Sickle-cell trait: Secondary | ICD-10-CM | POA: Diagnosis not present

## 2021-01-03 DIAGNOSIS — K449 Diaphragmatic hernia without obstruction or gangrene: Secondary | ICD-10-CM | POA: Diagnosis not present

## 2021-01-03 DIAGNOSIS — D46Z Other myelodysplastic syndromes: Secondary | ICD-10-CM | POA: Diagnosis not present

## 2021-01-03 DIAGNOSIS — I129 Hypertensive chronic kidney disease with stage 1 through stage 4 chronic kidney disease, or unspecified chronic kidney disease: Secondary | ICD-10-CM | POA: Diagnosis not present

## 2021-01-03 DIAGNOSIS — J8409 Other alveolar and parieto-alveolar conditions: Secondary | ICD-10-CM | POA: Diagnosis not present

## 2021-01-03 DIAGNOSIS — M3500 Sicca syndrome, unspecified: Secondary | ICD-10-CM | POA: Diagnosis not present

## 2021-01-03 DIAGNOSIS — M170 Bilateral primary osteoarthritis of knee: Secondary | ICD-10-CM | POA: Diagnosis not present

## 2021-01-03 DIAGNOSIS — K754 Autoimmune hepatitis: Secondary | ICD-10-CM | POA: Diagnosis not present

## 2021-01-03 DIAGNOSIS — K5730 Diverticulosis of large intestine without perforation or abscess without bleeding: Secondary | ICD-10-CM | POA: Diagnosis not present

## 2021-01-03 DIAGNOSIS — R000 Tachycardia, unspecified: Secondary | ICD-10-CM | POA: Diagnosis not present

## 2021-01-03 DIAGNOSIS — N183 Chronic kidney disease, stage 3 unspecified: Secondary | ICD-10-CM | POA: Diagnosis not present

## 2021-01-03 DIAGNOSIS — D126 Benign neoplasm of colon, unspecified: Secondary | ICD-10-CM | POA: Diagnosis not present

## 2021-01-03 DIAGNOSIS — R7303 Prediabetes: Secondary | ICD-10-CM | POA: Diagnosis not present

## 2021-01-03 DIAGNOSIS — J8410 Pulmonary fibrosis, unspecified: Secondary | ICD-10-CM | POA: Diagnosis not present

## 2021-01-03 DIAGNOSIS — M791 Myalgia, unspecified site: Secondary | ICD-10-CM | POA: Diagnosis not present

## 2021-01-04 DIAGNOSIS — I129 Hypertensive chronic kidney disease with stage 1 through stage 4 chronic kidney disease, or unspecified chronic kidney disease: Secondary | ICD-10-CM | POA: Diagnosis not present

## 2021-01-04 DIAGNOSIS — R000 Tachycardia, unspecified: Secondary | ICD-10-CM | POA: Diagnosis not present

## 2021-01-04 DIAGNOSIS — M3500 Sicca syndrome, unspecified: Secondary | ICD-10-CM | POA: Diagnosis not present

## 2021-01-04 DIAGNOSIS — M170 Bilateral primary osteoarthritis of knee: Secondary | ICD-10-CM | POA: Diagnosis not present

## 2021-01-04 DIAGNOSIS — K5900 Constipation, unspecified: Secondary | ICD-10-CM | POA: Diagnosis not present

## 2021-01-04 DIAGNOSIS — K754 Autoimmune hepatitis: Secondary | ICD-10-CM | POA: Diagnosis not present

## 2021-01-04 DIAGNOSIS — N183 Chronic kidney disease, stage 3 unspecified: Secondary | ICD-10-CM | POA: Diagnosis not present

## 2021-01-04 DIAGNOSIS — R7303 Prediabetes: Secondary | ICD-10-CM | POA: Diagnosis not present

## 2021-01-04 DIAGNOSIS — K449 Diaphragmatic hernia without obstruction or gangrene: Secondary | ICD-10-CM | POA: Diagnosis not present

## 2021-01-04 DIAGNOSIS — D46Z Other myelodysplastic syndromes: Secondary | ICD-10-CM | POA: Diagnosis not present

## 2021-01-04 DIAGNOSIS — K5730 Diverticulosis of large intestine without perforation or abscess without bleeding: Secondary | ICD-10-CM | POA: Diagnosis not present

## 2021-01-04 DIAGNOSIS — D126 Benign neoplasm of colon, unspecified: Secondary | ICD-10-CM | POA: Diagnosis not present

## 2021-01-04 DIAGNOSIS — J8409 Other alveolar and parieto-alveolar conditions: Secondary | ICD-10-CM | POA: Diagnosis not present

## 2021-01-04 DIAGNOSIS — J8410 Pulmonary fibrosis, unspecified: Secondary | ICD-10-CM | POA: Diagnosis not present

## 2021-01-04 DIAGNOSIS — D573 Sickle-cell trait: Secondary | ICD-10-CM | POA: Diagnosis not present

## 2021-01-04 DIAGNOSIS — M791 Myalgia, unspecified site: Secondary | ICD-10-CM | POA: Diagnosis not present

## 2021-01-06 DIAGNOSIS — M3500 Sicca syndrome, unspecified: Secondary | ICD-10-CM | POA: Diagnosis not present

## 2021-01-06 DIAGNOSIS — J8409 Other alveolar and parieto-alveolar conditions: Secondary | ICD-10-CM | POA: Diagnosis not present

## 2021-01-06 DIAGNOSIS — N183 Chronic kidney disease, stage 3 unspecified: Secondary | ICD-10-CM | POA: Diagnosis not present

## 2021-01-06 DIAGNOSIS — J8410 Pulmonary fibrosis, unspecified: Secondary | ICD-10-CM | POA: Diagnosis not present

## 2021-01-06 DIAGNOSIS — D573 Sickle-cell trait: Secondary | ICD-10-CM | POA: Diagnosis not present

## 2021-01-06 DIAGNOSIS — D126 Benign neoplasm of colon, unspecified: Secondary | ICD-10-CM | POA: Diagnosis not present

## 2021-01-06 DIAGNOSIS — D46Z Other myelodysplastic syndromes: Secondary | ICD-10-CM | POA: Diagnosis not present

## 2021-01-06 DIAGNOSIS — K754 Autoimmune hepatitis: Secondary | ICD-10-CM | POA: Diagnosis not present

## 2021-01-06 DIAGNOSIS — K5900 Constipation, unspecified: Secondary | ICD-10-CM | POA: Diagnosis not present

## 2021-01-06 DIAGNOSIS — I129 Hypertensive chronic kidney disease with stage 1 through stage 4 chronic kidney disease, or unspecified chronic kidney disease: Secondary | ICD-10-CM | POA: Diagnosis not present

## 2021-01-06 DIAGNOSIS — K5730 Diverticulosis of large intestine without perforation or abscess without bleeding: Secondary | ICD-10-CM | POA: Diagnosis not present

## 2021-01-06 DIAGNOSIS — K449 Diaphragmatic hernia without obstruction or gangrene: Secondary | ICD-10-CM | POA: Diagnosis not present

## 2021-01-06 DIAGNOSIS — R7303 Prediabetes: Secondary | ICD-10-CM | POA: Diagnosis not present

## 2021-01-06 DIAGNOSIS — M791 Myalgia, unspecified site: Secondary | ICD-10-CM | POA: Diagnosis not present

## 2021-01-06 DIAGNOSIS — R000 Tachycardia, unspecified: Secondary | ICD-10-CM | POA: Diagnosis not present

## 2021-01-06 DIAGNOSIS — M170 Bilateral primary osteoarthritis of knee: Secondary | ICD-10-CM | POA: Diagnosis not present

## 2021-01-10 DIAGNOSIS — J8410 Pulmonary fibrosis, unspecified: Secondary | ICD-10-CM | POA: Diagnosis not present

## 2021-01-10 DIAGNOSIS — D126 Benign neoplasm of colon, unspecified: Secondary | ICD-10-CM | POA: Diagnosis not present

## 2021-01-10 DIAGNOSIS — K449 Diaphragmatic hernia without obstruction or gangrene: Secondary | ICD-10-CM | POA: Diagnosis not present

## 2021-01-10 DIAGNOSIS — J8409 Other alveolar and parieto-alveolar conditions: Secondary | ICD-10-CM | POA: Diagnosis not present

## 2021-01-10 DIAGNOSIS — R7303 Prediabetes: Secondary | ICD-10-CM | POA: Diagnosis not present

## 2021-01-10 DIAGNOSIS — D46Z Other myelodysplastic syndromes: Secondary | ICD-10-CM | POA: Diagnosis not present

## 2021-01-10 DIAGNOSIS — D573 Sickle-cell trait: Secondary | ICD-10-CM | POA: Diagnosis not present

## 2021-01-10 DIAGNOSIS — K5900 Constipation, unspecified: Secondary | ICD-10-CM | POA: Diagnosis not present

## 2021-01-10 DIAGNOSIS — K754 Autoimmune hepatitis: Secondary | ICD-10-CM | POA: Diagnosis not present

## 2021-01-10 DIAGNOSIS — R000 Tachycardia, unspecified: Secondary | ICD-10-CM | POA: Diagnosis not present

## 2021-01-10 DIAGNOSIS — M3500 Sicca syndrome, unspecified: Secondary | ICD-10-CM | POA: Diagnosis not present

## 2021-01-10 DIAGNOSIS — K5730 Diverticulosis of large intestine without perforation or abscess without bleeding: Secondary | ICD-10-CM | POA: Diagnosis not present

## 2021-01-10 DIAGNOSIS — M170 Bilateral primary osteoarthritis of knee: Secondary | ICD-10-CM | POA: Diagnosis not present

## 2021-01-10 DIAGNOSIS — M791 Myalgia, unspecified site: Secondary | ICD-10-CM | POA: Diagnosis not present

## 2021-01-10 DIAGNOSIS — N183 Chronic kidney disease, stage 3 unspecified: Secondary | ICD-10-CM | POA: Diagnosis not present

## 2021-01-10 DIAGNOSIS — I129 Hypertensive chronic kidney disease with stage 1 through stage 4 chronic kidney disease, or unspecified chronic kidney disease: Secondary | ICD-10-CM | POA: Diagnosis not present

## 2021-01-13 DIAGNOSIS — D46Z Other myelodysplastic syndromes: Secondary | ICD-10-CM | POA: Diagnosis not present

## 2021-01-13 DIAGNOSIS — N183 Chronic kidney disease, stage 3 unspecified: Secondary | ICD-10-CM | POA: Diagnosis not present

## 2021-01-13 DIAGNOSIS — M3500 Sicca syndrome, unspecified: Secondary | ICD-10-CM | POA: Diagnosis not present

## 2021-01-13 DIAGNOSIS — D126 Benign neoplasm of colon, unspecified: Secondary | ICD-10-CM | POA: Diagnosis not present

## 2021-01-13 DIAGNOSIS — R7303 Prediabetes: Secondary | ICD-10-CM | POA: Diagnosis not present

## 2021-01-13 DIAGNOSIS — I129 Hypertensive chronic kidney disease with stage 1 through stage 4 chronic kidney disease, or unspecified chronic kidney disease: Secondary | ICD-10-CM | POA: Diagnosis not present

## 2021-01-13 DIAGNOSIS — K5900 Constipation, unspecified: Secondary | ICD-10-CM | POA: Diagnosis not present

## 2021-01-13 DIAGNOSIS — J8410 Pulmonary fibrosis, unspecified: Secondary | ICD-10-CM | POA: Diagnosis not present

## 2021-01-13 DIAGNOSIS — D573 Sickle-cell trait: Secondary | ICD-10-CM | POA: Diagnosis not present

## 2021-01-13 DIAGNOSIS — M170 Bilateral primary osteoarthritis of knee: Secondary | ICD-10-CM | POA: Diagnosis not present

## 2021-01-13 DIAGNOSIS — M791 Myalgia, unspecified site: Secondary | ICD-10-CM | POA: Diagnosis not present

## 2021-01-13 DIAGNOSIS — K449 Diaphragmatic hernia without obstruction or gangrene: Secondary | ICD-10-CM | POA: Diagnosis not present

## 2021-01-13 DIAGNOSIS — R000 Tachycardia, unspecified: Secondary | ICD-10-CM | POA: Diagnosis not present

## 2021-01-13 DIAGNOSIS — K5730 Diverticulosis of large intestine without perforation or abscess without bleeding: Secondary | ICD-10-CM | POA: Diagnosis not present

## 2021-01-13 DIAGNOSIS — K754 Autoimmune hepatitis: Secondary | ICD-10-CM | POA: Diagnosis not present

## 2021-01-13 DIAGNOSIS — J8409 Other alveolar and parieto-alveolar conditions: Secondary | ICD-10-CM | POA: Diagnosis not present

## 2021-01-17 DIAGNOSIS — K5730 Diverticulosis of large intestine without perforation or abscess without bleeding: Secondary | ICD-10-CM | POA: Diagnosis not present

## 2021-01-17 DIAGNOSIS — K449 Diaphragmatic hernia without obstruction or gangrene: Secondary | ICD-10-CM | POA: Diagnosis not present

## 2021-01-17 DIAGNOSIS — N183 Chronic kidney disease, stage 3 unspecified: Secondary | ICD-10-CM | POA: Diagnosis not present

## 2021-01-17 DIAGNOSIS — J8409 Other alveolar and parieto-alveolar conditions: Secondary | ICD-10-CM | POA: Diagnosis not present

## 2021-01-17 DIAGNOSIS — D573 Sickle-cell trait: Secondary | ICD-10-CM | POA: Diagnosis not present

## 2021-01-17 DIAGNOSIS — J8410 Pulmonary fibrosis, unspecified: Secondary | ICD-10-CM | POA: Diagnosis not present

## 2021-01-17 DIAGNOSIS — R7303 Prediabetes: Secondary | ICD-10-CM | POA: Diagnosis not present

## 2021-01-17 DIAGNOSIS — R000 Tachycardia, unspecified: Secondary | ICD-10-CM | POA: Diagnosis not present

## 2021-01-17 DIAGNOSIS — M170 Bilateral primary osteoarthritis of knee: Secondary | ICD-10-CM | POA: Diagnosis not present

## 2021-01-17 DIAGNOSIS — M3500 Sicca syndrome, unspecified: Secondary | ICD-10-CM | POA: Diagnosis not present

## 2021-01-17 DIAGNOSIS — K754 Autoimmune hepatitis: Secondary | ICD-10-CM | POA: Diagnosis not present

## 2021-01-17 DIAGNOSIS — K5900 Constipation, unspecified: Secondary | ICD-10-CM | POA: Diagnosis not present

## 2021-01-17 DIAGNOSIS — I129 Hypertensive chronic kidney disease with stage 1 through stage 4 chronic kidney disease, or unspecified chronic kidney disease: Secondary | ICD-10-CM | POA: Diagnosis not present

## 2021-01-17 DIAGNOSIS — M791 Myalgia, unspecified site: Secondary | ICD-10-CM | POA: Diagnosis not present

## 2021-01-17 DIAGNOSIS — D46Z Other myelodysplastic syndromes: Secondary | ICD-10-CM | POA: Diagnosis not present

## 2021-01-17 DIAGNOSIS — D126 Benign neoplasm of colon, unspecified: Secondary | ICD-10-CM | POA: Diagnosis not present

## 2021-01-20 DIAGNOSIS — D573 Sickle-cell trait: Secondary | ICD-10-CM | POA: Diagnosis not present

## 2021-01-20 DIAGNOSIS — K5900 Constipation, unspecified: Secondary | ICD-10-CM | POA: Diagnosis not present

## 2021-01-20 DIAGNOSIS — K5730 Diverticulosis of large intestine without perforation or abscess without bleeding: Secondary | ICD-10-CM | POA: Diagnosis not present

## 2021-01-20 DIAGNOSIS — R7303 Prediabetes: Secondary | ICD-10-CM | POA: Diagnosis not present

## 2021-01-20 DIAGNOSIS — M3500 Sicca syndrome, unspecified: Secondary | ICD-10-CM | POA: Diagnosis not present

## 2021-01-20 DIAGNOSIS — K754 Autoimmune hepatitis: Secondary | ICD-10-CM | POA: Diagnosis not present

## 2021-01-20 DIAGNOSIS — D46Z Other myelodysplastic syndromes: Secondary | ICD-10-CM | POA: Diagnosis not present

## 2021-01-20 DIAGNOSIS — M791 Myalgia, unspecified site: Secondary | ICD-10-CM | POA: Diagnosis not present

## 2021-01-20 DIAGNOSIS — I129 Hypertensive chronic kidney disease with stage 1 through stage 4 chronic kidney disease, or unspecified chronic kidney disease: Secondary | ICD-10-CM | POA: Diagnosis not present

## 2021-01-20 DIAGNOSIS — J8409 Other alveolar and parieto-alveolar conditions: Secondary | ICD-10-CM | POA: Diagnosis not present

## 2021-01-20 DIAGNOSIS — R000 Tachycardia, unspecified: Secondary | ICD-10-CM | POA: Diagnosis not present

## 2021-01-20 DIAGNOSIS — J8410 Pulmonary fibrosis, unspecified: Secondary | ICD-10-CM | POA: Diagnosis not present

## 2021-01-20 DIAGNOSIS — K449 Diaphragmatic hernia without obstruction or gangrene: Secondary | ICD-10-CM | POA: Diagnosis not present

## 2021-01-20 DIAGNOSIS — N183 Chronic kidney disease, stage 3 unspecified: Secondary | ICD-10-CM | POA: Diagnosis not present

## 2021-01-20 DIAGNOSIS — M170 Bilateral primary osteoarthritis of knee: Secondary | ICD-10-CM | POA: Diagnosis not present

## 2021-01-20 DIAGNOSIS — D126 Benign neoplasm of colon, unspecified: Secondary | ICD-10-CM | POA: Diagnosis not present

## 2021-02-06 DIAGNOSIS — K2950 Unspecified chronic gastritis without bleeding: Secondary | ICD-10-CM | POA: Diagnosis not present

## 2021-02-06 DIAGNOSIS — K449 Diaphragmatic hernia without obstruction or gangrene: Secondary | ICD-10-CM | POA: Diagnosis not present

## 2021-02-06 DIAGNOSIS — K2900 Acute gastritis without bleeding: Secondary | ICD-10-CM | POA: Diagnosis not present

## 2021-02-06 DIAGNOSIS — K3189 Other diseases of stomach and duodenum: Secondary | ICD-10-CM | POA: Diagnosis not present

## 2021-02-06 DIAGNOSIS — T182XXA Foreign body in stomach, initial encounter: Secondary | ICD-10-CM | POA: Diagnosis not present

## 2021-02-06 DIAGNOSIS — K222 Esophageal obstruction: Secondary | ICD-10-CM | POA: Diagnosis not present

## 2021-02-21 DIAGNOSIS — Z1231 Encounter for screening mammogram for malignant neoplasm of breast: Secondary | ICD-10-CM | POA: Diagnosis not present

## 2021-03-07 DIAGNOSIS — R928 Other abnormal and inconclusive findings on diagnostic imaging of breast: Secondary | ICD-10-CM | POA: Diagnosis not present

## 2021-03-14 DIAGNOSIS — H6123 Impacted cerumen, bilateral: Secondary | ICD-10-CM | POA: Diagnosis not present

## 2021-03-21 DIAGNOSIS — K219 Gastro-esophageal reflux disease without esophagitis: Secondary | ICD-10-CM | POA: Diagnosis not present

## 2021-03-21 DIAGNOSIS — N183 Chronic kidney disease, stage 3 unspecified: Secondary | ICD-10-CM | POA: Diagnosis not present

## 2021-03-21 DIAGNOSIS — D469 Myelodysplastic syndrome, unspecified: Secondary | ICD-10-CM | POA: Diagnosis not present

## 2021-03-21 DIAGNOSIS — E782 Mixed hyperlipidemia: Secondary | ICD-10-CM | POA: Diagnosis not present

## 2021-03-21 DIAGNOSIS — Z0000 Encounter for general adult medical examination without abnormal findings: Secondary | ICD-10-CM | POA: Diagnosis not present

## 2021-03-21 DIAGNOSIS — M3500 Sicca syndrome, unspecified: Secondary | ICD-10-CM | POA: Diagnosis not present

## 2021-08-09 ENCOUNTER — Other Ambulatory Visit: Payer: Self-pay | Admitting: Oncology

## 2021-08-21 IMAGING — CT CT BIOPSY
1 series · 1 of 1 positions shown · non-contrast
Comparison: none

INDICATION: 63-year-old female with symptomatic anemia of uncertain etiology.
She presents for CT-guided bone marrow aspiration and core biopsy.

[Series 1: topogram 0.6 t20f · coronal · 1.00mm/px · 1 of 1 slices shown]
[im 1/1]
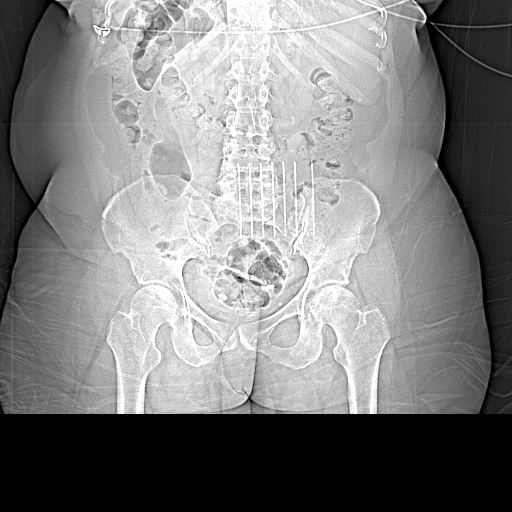

[1 of 1 positions shown; findings below may reference images not displayed]

EXAM:
CT GUIDED BONE MARROW ASPIRATION AND CORE BIOPSY

MEDICATIONS:
None.

ANESTHESIA/SEDATION:
Moderate (conscious) sedation was employed during this procedure. A
total of 2 milligrams versed and 50 micrograms fentanyl were
administered intravenously. The patient's level of consciousness and
vital signs were monitored continuously by radiology nursing
throughout the procedure under my direct supervision.

Total monitored sedation time: 10 minutes

FLUOROSCOPY TIME:  None.

COMPLICATIONS:
None immediate.

Estimated blood loss: <25 mL

PROCEDURE:
Informed written consent was obtained from the patient after a
thorough discussion of the procedural risks, benefits and
alternatives. All questions were addressed. Maximal Sterile Barrier
Technique was utilized including caps, mask, sterile gowns, sterile
gloves, sterile drape, hand hygiene and skin antiseptic. A timeout
was performed prior to the initiation of the procedure.

The patient was positioned prone and non-contrast localization CT
was performed of the pelvis to demonstrate the iliac marrow spaces.

Maximal barrier sterile technique utilized including caps, mask,
sterile gowns, sterile gloves, large sterile drape, hand hygiene,
and betadine prep.


## 2022-03-13 ENCOUNTER — Other Ambulatory Visit: Payer: Self-pay | Admitting: Nurse Practitioner

## 2022-05-03 ENCOUNTER — Other Ambulatory Visit: Payer: Self-pay

## 2022-11-29 DIAGNOSIS — M5442 Lumbago with sciatica, left side: Secondary | ICD-10-CM | POA: Diagnosis not present

## 2022-11-29 DIAGNOSIS — M545 Low back pain, unspecified: Secondary | ICD-10-CM | POA: Diagnosis not present

## 2022-12-07 DIAGNOSIS — M545 Low back pain, unspecified: Secondary | ICD-10-CM | POA: Diagnosis not present

## 2022-12-11 DIAGNOSIS — M5442 Lumbago with sciatica, left side: Secondary | ICD-10-CM | POA: Diagnosis not present

## 2022-12-11 DIAGNOSIS — M7138 Other bursal cyst, other site: Secondary | ICD-10-CM | POA: Diagnosis not present

## 2022-12-18 DIAGNOSIS — M5416 Radiculopathy, lumbar region: Secondary | ICD-10-CM | POA: Diagnosis not present

## 2022-12-27 DIAGNOSIS — M5416 Radiculopathy, lumbar region: Secondary | ICD-10-CM | POA: Diagnosis not present

## 2023-01-18 DIAGNOSIS — M5416 Radiculopathy, lumbar region: Secondary | ICD-10-CM | POA: Diagnosis not present

## 2023-01-22 DIAGNOSIS — E559 Vitamin D deficiency, unspecified: Secondary | ICD-10-CM | POA: Diagnosis not present

## 2023-01-22 DIAGNOSIS — G4733 Obstructive sleep apnea (adult) (pediatric): Secondary | ICD-10-CM | POA: Diagnosis not present

## 2023-01-22 DIAGNOSIS — H9319 Tinnitus, unspecified ear: Secondary | ICD-10-CM | POA: Diagnosis not present

## 2023-01-22 DIAGNOSIS — M3500 Sicca syndrome, unspecified: Secondary | ICD-10-CM | POA: Diagnosis not present

## 2023-01-22 DIAGNOSIS — I10 Essential (primary) hypertension: Secondary | ICD-10-CM | POA: Diagnosis not present

## 2023-01-22 DIAGNOSIS — Z131 Encounter for screening for diabetes mellitus: Secondary | ICD-10-CM | POA: Diagnosis not present

## 2023-01-22 DIAGNOSIS — E785 Hyperlipidemia, unspecified: Secondary | ICD-10-CM | POA: Diagnosis not present

## 2023-01-22 DIAGNOSIS — L930 Discoid lupus erythematosus: Secondary | ICD-10-CM | POA: Diagnosis not present

## 2023-01-22 DIAGNOSIS — M1990 Unspecified osteoarthritis, unspecified site: Secondary | ICD-10-CM | POA: Diagnosis not present

## 2023-01-22 DIAGNOSIS — R7989 Other specified abnormal findings of blood chemistry: Secondary | ICD-10-CM | POA: Diagnosis not present

## 2023-01-24 DIAGNOSIS — I10 Essential (primary) hypertension: Secondary | ICD-10-CM | POA: Diagnosis not present

## 2023-01-24 DIAGNOSIS — M1990 Unspecified osteoarthritis, unspecified site: Secondary | ICD-10-CM | POA: Diagnosis not present

## 2023-01-24 DIAGNOSIS — Z862 Personal history of diseases of the blood and blood-forming organs and certain disorders involving the immune mechanism: Secondary | ICD-10-CM | POA: Diagnosis not present

## 2023-01-24 DIAGNOSIS — R7989 Other specified abnormal findings of blood chemistry: Secondary | ICD-10-CM | POA: Diagnosis not present

## 2023-02-05 DIAGNOSIS — E785 Hyperlipidemia, unspecified: Secondary | ICD-10-CM | POA: Diagnosis not present

## 2023-02-12 DIAGNOSIS — R7989 Other specified abnormal findings of blood chemistry: Secondary | ICD-10-CM | POA: Diagnosis not present

## 2023-02-21 DIAGNOSIS — I10 Essential (primary) hypertension: Secondary | ICD-10-CM | POA: Diagnosis not present

## 2023-03-07 DIAGNOSIS — I10 Essential (primary) hypertension: Secondary | ICD-10-CM | POA: Diagnosis not present

## 2023-03-07 DIAGNOSIS — R7989 Other specified abnormal findings of blood chemistry: Secondary | ICD-10-CM | POA: Diagnosis not present

## 2023-03-26 DIAGNOSIS — R7989 Other specified abnormal findings of blood chemistry: Secondary | ICD-10-CM | POA: Diagnosis not present

## 2023-03-26 DIAGNOSIS — E785 Hyperlipidemia, unspecified: Secondary | ICD-10-CM | POA: Diagnosis not present

## 2023-04-03 DIAGNOSIS — I10 Essential (primary) hypertension: Secondary | ICD-10-CM | POA: Diagnosis not present

## 2023-04-10 DIAGNOSIS — I10 Essential (primary) hypertension: Secondary | ICD-10-CM | POA: Diagnosis not present

## 2023-04-17 DIAGNOSIS — R7989 Other specified abnormal findings of blood chemistry: Secondary | ICD-10-CM | POA: Diagnosis not present

## 2023-04-23 DIAGNOSIS — R7989 Other specified abnormal findings of blood chemistry: Secondary | ICD-10-CM | POA: Diagnosis not present

## 2023-05-01 DIAGNOSIS — I10 Essential (primary) hypertension: Secondary | ICD-10-CM | POA: Diagnosis not present

## 2023-05-15 DIAGNOSIS — I10 Essential (primary) hypertension: Secondary | ICD-10-CM | POA: Diagnosis not present

## 2023-05-22 DIAGNOSIS — I10 Essential (primary) hypertension: Secondary | ICD-10-CM | POA: Diagnosis not present

## 2023-06-05 DIAGNOSIS — E785 Hyperlipidemia, unspecified: Secondary | ICD-10-CM | POA: Diagnosis not present

## 2023-06-19 DIAGNOSIS — R7989 Other specified abnormal findings of blood chemistry: Secondary | ICD-10-CM | POA: Diagnosis not present

## 2023-06-26 DIAGNOSIS — R7989 Other specified abnormal findings of blood chemistry: Secondary | ICD-10-CM | POA: Diagnosis not present

## 2023-07-31 DIAGNOSIS — E785 Hyperlipidemia, unspecified: Secondary | ICD-10-CM | POA: Diagnosis not present

## 2023-09-03 DIAGNOSIS — R7989 Other specified abnormal findings of blood chemistry: Secondary | ICD-10-CM | POA: Diagnosis not present

## 2023-09-10 DIAGNOSIS — I679 Cerebrovascular disease, unspecified: Secondary | ICD-10-CM | POA: Diagnosis not present

## 2023-09-10 DIAGNOSIS — Z9484 Stem cells transplant status: Secondary | ICD-10-CM | POA: Diagnosis not present

## 2023-09-10 DIAGNOSIS — M3500 Sicca syndrome, unspecified: Secondary | ICD-10-CM | POA: Diagnosis not present

## 2023-09-10 DIAGNOSIS — D469 Myelodysplastic syndrome, unspecified: Secondary | ICD-10-CM | POA: Diagnosis not present

## 2023-09-10 DIAGNOSIS — N183 Chronic kidney disease, stage 3 unspecified: Secondary | ICD-10-CM | POA: Diagnosis not present

## 2023-09-10 DIAGNOSIS — E782 Mixed hyperlipidemia: Secondary | ICD-10-CM | POA: Diagnosis not present

## 2023-09-10 DIAGNOSIS — G4733 Obstructive sleep apnea (adult) (pediatric): Secondary | ICD-10-CM | POA: Diagnosis not present

## 2023-09-10 DIAGNOSIS — I10 Essential (primary) hypertension: Secondary | ICD-10-CM | POA: Diagnosis not present

## 2023-09-10 DIAGNOSIS — Z0000 Encounter for general adult medical examination without abnormal findings: Secondary | ICD-10-CM | POA: Diagnosis not present

## 2023-09-10 DIAGNOSIS — K219 Gastro-esophageal reflux disease without esophagitis: Secondary | ICD-10-CM | POA: Diagnosis not present

## 2023-10-10 DIAGNOSIS — I10 Essential (primary) hypertension: Secondary | ICD-10-CM | POA: Diagnosis not present

## 2023-10-17 DIAGNOSIS — K143 Hypertrophy of tongue papillae: Secondary | ICD-10-CM | POA: Diagnosis not present

## 2023-10-17 DIAGNOSIS — K117 Disturbances of salivary secretion: Secondary | ICD-10-CM | POA: Diagnosis not present

## 2023-10-17 DIAGNOSIS — D46Z Other myelodysplastic syndromes: Secondary | ICD-10-CM | POA: Diagnosis not present

## 2023-10-17 DIAGNOSIS — Z91843 Risk for dental caries, high: Secondary | ICD-10-CM | POA: Diagnosis not present

## 2023-10-17 DIAGNOSIS — Z9484 Stem cells transplant status: Secondary | ICD-10-CM | POA: Diagnosis not present

## 2023-10-17 DIAGNOSIS — D89811 Chronic graft-versus-host disease: Secondary | ICD-10-CM | POA: Diagnosis not present

## 2023-10-17 DIAGNOSIS — K030 Excessive attrition of teeth: Secondary | ICD-10-CM | POA: Diagnosis not present

## 2023-10-17 DIAGNOSIS — K029 Dental caries, unspecified: Secondary | ICD-10-CM | POA: Diagnosis not present

## 2023-11-20 DIAGNOSIS — I10 Essential (primary) hypertension: Secondary | ICD-10-CM | POA: Diagnosis not present

## 2023-12-18 DIAGNOSIS — I10 Essential (primary) hypertension: Secondary | ICD-10-CM | POA: Diagnosis not present

## 2024-01-16 DIAGNOSIS — K029 Dental caries, unspecified: Secondary | ICD-10-CM | POA: Diagnosis not present

## 2024-01-16 DIAGNOSIS — Z9484 Stem cells transplant status: Secondary | ICD-10-CM | POA: Diagnosis not present

## 2024-01-16 DIAGNOSIS — D89811 Chronic graft-versus-host disease: Secondary | ICD-10-CM | POA: Diagnosis not present

## 2024-01-16 DIAGNOSIS — Z91843 Risk for dental caries, high: Secondary | ICD-10-CM | POA: Diagnosis not present

## 2024-01-16 DIAGNOSIS — M3509 Sicca syndrome with other organ involvement: Secondary | ICD-10-CM | POA: Diagnosis not present

## 2024-01-16 DIAGNOSIS — K056 Periodontal disease, unspecified: Secondary | ICD-10-CM | POA: Diagnosis not present

## 2024-01-16 DIAGNOSIS — K117 Disturbances of salivary secretion: Secondary | ICD-10-CM | POA: Diagnosis not present

## 2024-01-16 DIAGNOSIS — K030 Excessive attrition of teeth: Secondary | ICD-10-CM | POA: Diagnosis not present

## 2024-01-16 DIAGNOSIS — D46Z Other myelodysplastic syndromes: Secondary | ICD-10-CM | POA: Diagnosis not present

## 2024-01-16 DIAGNOSIS — K143 Hypertrophy of tongue papillae: Secondary | ICD-10-CM | POA: Diagnosis not present

## 2024-01-22 DIAGNOSIS — E785 Hyperlipidemia, unspecified: Secondary | ICD-10-CM | POA: Diagnosis not present

## 2024-02-19 DIAGNOSIS — E785 Hyperlipidemia, unspecified: Secondary | ICD-10-CM | POA: Diagnosis not present

## 2024-02-19 DIAGNOSIS — R7989 Other specified abnormal findings of blood chemistry: Secondary | ICD-10-CM | POA: Diagnosis not present

## 2024-03-04 DIAGNOSIS — E785 Hyperlipidemia, unspecified: Secondary | ICD-10-CM | POA: Diagnosis not present

## 2024-04-09 DIAGNOSIS — R7989 Other specified abnormal findings of blood chemistry: Secondary | ICD-10-CM | POA: Diagnosis not present

## 2024-04-29 ENCOUNTER — Other Ambulatory Visit: Payer: Self-pay

## 2024-05-20 DIAGNOSIS — E785 Hyperlipidemia, unspecified: Secondary | ICD-10-CM | POA: Diagnosis not present

## 2024-07-15 DIAGNOSIS — G4733 Obstructive sleep apnea (adult) (pediatric): Secondary | ICD-10-CM | POA: Diagnosis not present

## 2024-07-29 DIAGNOSIS — I10 Essential (primary) hypertension: Secondary | ICD-10-CM | POA: Diagnosis not present
